# Patient Record
Sex: Female | Born: 2000 | Race: White | Hispanic: No | Marital: Single | Smoking: Current every day smoker
Health system: Southern US, Community
[De-identification: ages and names within clinical notes are randomized; demographics above are authoritative.]

## PROBLEM LIST (undated history)

## (undated) VITALS — BP 118/78 | HR 105 | Temp 98.2°F | Resp 18 | Ht 64.0 in | Wt 265.0 lb

## (undated) DIAGNOSIS — R4589 Other symptoms and signs involving emotional state: Secondary | ICD-10-CM

## (undated) DIAGNOSIS — F32A Depression, unspecified: Secondary | ICD-10-CM

## (undated) DIAGNOSIS — K219 Gastro-esophageal reflux disease without esophagitis: Secondary | ICD-10-CM

## (undated) DIAGNOSIS — F419 Anxiety disorder, unspecified: Secondary | ICD-10-CM

## (undated) DIAGNOSIS — D649 Anemia, unspecified: Secondary | ICD-10-CM

## (undated) DIAGNOSIS — R45851 Suicidal ideations: Secondary | ICD-10-CM

## (undated) DIAGNOSIS — J45909 Unspecified asthma, uncomplicated: Secondary | ICD-10-CM

## (undated) DIAGNOSIS — T7840XA Allergy, unspecified, initial encounter: Secondary | ICD-10-CM

## (undated) DIAGNOSIS — F649 Gender identity disorder, unspecified: Secondary | ICD-10-CM

## (undated) DIAGNOSIS — F429 Obsessive-compulsive disorder, unspecified: Secondary | ICD-10-CM

## (undated) DIAGNOSIS — F329 Major depressive disorder, single episode, unspecified: Secondary | ICD-10-CM

## (undated) DIAGNOSIS — F1210 Cannabis abuse, uncomplicated: Secondary | ICD-10-CM

## (undated) HISTORY — DX: Gastro-esophageal reflux disease without esophagitis: K21.9

## (undated) HISTORY — DX: Allergy, unspecified, initial encounter: T78.40XA

## (undated) HISTORY — PX: OTHER SURGICAL HISTORY: SHX169

## (undated) HISTORY — DX: Anemia, unspecified: D64.9

## (undated) HISTORY — DX: Unspecified asthma, uncomplicated: J45.909

---
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
## 2018-02-08 ENCOUNTER — Encounter: Payer: Self-pay | Admitting: Physician Assistant

## 2018-02-08 ENCOUNTER — Ambulatory Visit: Payer: Managed Care, Other (non HMO) | Admitting: Physician Assistant

## 2018-02-08 ENCOUNTER — Other Ambulatory Visit: Payer: Self-pay

## 2018-02-08 VITALS — BP 122/82 | HR 93 | Temp 98.0°F | Resp 16 | Ht 65.0 in | Wt 246.2 lb

## 2018-02-08 DIAGNOSIS — F640 Transsexualism: Secondary | ICD-10-CM

## 2018-02-08 DIAGNOSIS — Z79890 Hormone replacement therapy (postmenopausal): Secondary | ICD-10-CM | POA: Insufficient documentation

## 2018-02-08 DIAGNOSIS — IMO0001 Reserved for inherently not codable concepts without codable children: Secondary | ICD-10-CM

## 2018-02-08 DIAGNOSIS — Z789 Other specified health status: Secondary | ICD-10-CM | POA: Insufficient documentation

## 2018-02-08 MED ORDER — TESTOSTERONE CYPIONATE 200 MG/ML IM SOLN: 200.0000 mg | INTRAMUSCULAR | 2 refills | Status: DC

## 2018-02-08 NOTE — Patient Instructions (Signed)
     IF you received an x-ray today, you will receive an invoice from [facility]. Please contact [facility] at [phone] with questions or concerns regarding your invoice.   IF you received labwork today, you will receive an invoice from [facility]. Please contact [facility] at [phone] with questions or concerns regarding your invoice.   Our billing staff will not be able to assist you with questions regarding bills from these companies.  You will be contacted with the lab results as soon as they are available. The fastest way to get your results is to activate your My Chart account. Instructions are located on the last page of this paperwork. If you have not heard from us regarding the results in 2 weeks, please contact this office.     

## 2018-02-08 NOTE — Progress Notes (Signed)
Samantha Hunter  MRN: [id number] DOB: [birth year]  PCP: Patient, No Pcp Per  Subjective:  Pt is a female to female transgender patient who presents to clinic for modication refill.   Testosterone cypionate 12/27. Rx by his endocrinologist Linward Natal who recently retired. He has been having a very difficult time getting in touch with his office. Lst dose > 2 weeks ago. Denies breakthrough bleeding.  He has been taking this since 09/2017.   Just got a job at the [employer]. He is very happy about this.   Review of Systems  Constitutional: Negative for chills, diaphoresis, fatigue and fever.  HENT: Negative for congestion, postnasal drip, rhinorrhea, sinus pressure, sneezing and sore throat.   Respiratory: Negative for cough, chest tightness, shortness of breath and wheezing.   Cardiovascular: Negative for chest pain and palpitations.  Gastrointestinal: Negative for abdominal pain, diarrhea, nausea and vomiting.  Neurological: Negative for weakness, light-headedness and headaches.    Patient Active Problem List   Diagnosis Date Noted  . MDD (major depressive disorder), recurrent episode ([place]) 06/01/2017  . Gender dysphoria 05/12/2017  . Major depressive disorder, recurrent severe without psychotic features ([place]) 05/11/2017  . Cannabis use disorder, mild, abuse 03/16/2017  . OCD (obsessive compulsive disorder) 03/16/2017  . Suicidal ideation 03/16/2017  . Non-suicidal self harm 03/16/2017  . MDD (major depressive disorder), recurrent severe, without psychosis ([place]) 03/15/2017    Current Outpatient Medications on File Prior to Visit  Medication Sig Dispense Refill  . hydrOXYzine (ATARAX/VISTARIL) 50 MG tablet Take 1 tablet (50 mg total) by mouth 3 (three) times daily as needed for anxiety. 90 tablet 0  . testosterone cypionate (DEPOTESTOSTERONE CYPIONATE) 200 MG/ML injection Inject 200 mg into the muscle every 14 (fourteen) days.    . ARIPiprazole (ABILIFY) 5 MG tablet Take 1  tablet (5 mg total) by mouth daily. (Patient not taking: Reported on 02/08/2018) 30 tablet 0  . FLUoxetine (PROZAC) 20 MG capsule Take 1 capsule (20 mg total) by mouth daily. (Patient not taking: Reported on 02/08/2018) 30 capsule 0  . gabapentin (NEURONTIN) 400 MG capsule Take 1 capsule (400 mg total) by mouth at bedtime. (Patient not taking: Reported on 02/08/2018) 30 capsule 0  . loratadine (CLARITIN) 10 MG tablet Take 1 tablet (10 mg total) by mouth daily. (Patient not taking: Reported on 02/08/2018) 30 tablet 0  . nicotine polacrilex (NICORETTE) 2 MG gum Take 1 each (2 mg total) by mouth as needed for smoking cessation. (Patient not taking: Reported on 08/01/2017) 100 tablet 0  . prazosin (MINIPRESS) 2 MG capsule Take 1 capsule (2 mg total) by mouth at bedtime. (Patient not taking: Reported on 02/08/2018) 30 capsule 0   No current facility-administered medications on file prior to visit.     Allergies  Allergen Reactions  . Pineapple Hives and Itching     Objective:  BP 122/82   Pulse 93   Temp 98 F (36.7 C) (Oral)   Resp 16   Ht 5' 5"  (1.651 m)   Wt 246 lb 3.2 oz (111.7 kg)   SpO2 98%   BMI 40.97 kg/m   Physical Exam  Constitutional: He is oriented to person, place, and time and well-developed, well-nourished, and in no distress. No distress.  obese  Cardiovascular: Normal rate, regular rhythm and normal heart sounds.  Neurological: He is alert and oriented to person, place, and time. GCS score is 15.  Skin: Skin is warm and dry.  Psychiatric: Mood, memory, affect and judgment normal.  Vitals reviewed.   Assessment and Plan :  1. 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 Long-term current use of testosterone cypionate - testosterone cypionate (DEPOTESTOSTERONE CYPIONATE) 200 MG/ML injection; Inject 1 mL (200 mg total) into the muscle every 14 (fourteen) days.  Dispense: 1 mL; Refill: 2 - Pt needing testosterone medication refill as his endocrinologist recently and suddenly left the practice.  I called pharmacist and confirmed dose of medication: 211m/mL inj 0.674mIM q 2 weeks. OK to refill. Advised pt to find another endocrinologist to manage care.   WhMercer PodPA-C  [facility]/12/2017 11:43 AM

## 2018-03-20 ENCOUNTER — Ambulatory Visit (HOSPITAL_COMMUNITY)
Admission: EM | Admit: 2018-03-20 | Discharge: 2018-03-20 | Disposition: A | Discharge status: Home / Self Care | Payer: Managed Care, Other (non HMO) | Attending: Family Medicine | Admitting: Family Medicine

## 2018-03-20 ENCOUNTER — Encounter (HOSPITAL_COMMUNITY): Payer: Self-pay | Admitting: Emergency Medicine

## 2018-03-20 DIAGNOSIS — J020 Streptococcal pharyngitis: Secondary | ICD-10-CM

## 2018-03-20 LAB — POCT RAPID STREP A: Streptococcus, Group A Screen (Direct): POSITIVE — AB

## 2018-03-20 MED ORDER — AMOXICILLIN 875 MG PO TABS: 875.0000 mg | ORAL_TABLET | Freq: Two times a day (BID) | ORAL | 0 refills | Status: DC

## 2018-03-20 NOTE — ED Triage Notes (Signed)
Pt c/o severe throat pain.

## 2018-03-23 ENCOUNTER — Inpatient Hospital Stay (HOSPITAL_COMMUNITY)
Admission: EM | Admit: 2018-03-23 | Discharge: 2018-03-25 | DRG: 153 | Disposition: A | Discharge status: Home / Self Care | Payer: Managed Care, Other (non HMO) | Attending: Internal Medicine | Admitting: Internal Medicine

## 2018-03-23 ENCOUNTER — Encounter (HOSPITAL_COMMUNITY): Payer: Self-pay | Admitting: Emergency Medicine

## 2018-03-23 ENCOUNTER — Emergency Department (HOSPITAL_COMMUNITY): Payer: Managed Care, Other (non HMO)

## 2018-03-23 ENCOUNTER — Other Ambulatory Visit: Payer: Self-pay

## 2018-03-23 DIAGNOSIS — F329 Major depressive disorder, single episode, unspecified: Secondary | ICD-10-CM | POA: Diagnosis present

## 2018-03-23 DIAGNOSIS — F429 Obsessive-compulsive disorder, unspecified: Secondary | ICD-10-CM | POA: Diagnosis present

## 2018-03-23 DIAGNOSIS — Z823 Family history of stroke: Secondary | ICD-10-CM | POA: Diagnosis not present

## 2018-03-23 DIAGNOSIS — K219 Gastro-esophageal reflux disease without esophagitis: Secondary | ICD-10-CM | POA: Diagnosis present

## 2018-03-23 DIAGNOSIS — F419 Anxiety disorder, unspecified: Secondary | ICD-10-CM | POA: Diagnosis present

## 2018-03-23 DIAGNOSIS — E860 Dehydration: Secondary | ICD-10-CM | POA: Diagnosis present

## 2018-03-23 DIAGNOSIS — F32A Depression, unspecified: Secondary | ICD-10-CM | POA: Diagnosis present

## 2018-03-23 DIAGNOSIS — Z23 Encounter for immunization: Secondary | ICD-10-CM | POA: Diagnosis not present

## 2018-03-23 DIAGNOSIS — R1310 Dysphagia, unspecified: Secondary | ICD-10-CM

## 2018-03-23 DIAGNOSIS — F17210 Nicotine dependence, cigarettes, uncomplicated: Secondary | ICD-10-CM | POA: Diagnosis present

## 2018-03-23 DIAGNOSIS — J0300 Acute streptococcal tonsillitis, unspecified: Secondary | ICD-10-CM | POA: Diagnosis present

## 2018-03-23 DIAGNOSIS — Z8349 Family history of other endocrine, nutritional and metabolic diseases: Secondary | ICD-10-CM

## 2018-03-23 DIAGNOSIS — Z8249 Family history of ischemic heart disease and other diseases of the circulatory system: Secondary | ICD-10-CM | POA: Diagnosis not present

## 2018-03-23 DIAGNOSIS — K148 Other diseases of tongue: Secondary | ICD-10-CM | POA: Insufficient documentation

## 2018-03-23 DIAGNOSIS — F649 Gender identity disorder, unspecified: Secondary | ICD-10-CM | POA: Diagnosis present

## 2018-03-23 DIAGNOSIS — J0301 Acute recurrent streptococcal tonsillitis: Secondary | ICD-10-CM | POA: Diagnosis not present

## 2018-03-23 DIAGNOSIS — J45909 Unspecified asthma, uncomplicated: Secondary | ICD-10-CM | POA: Diagnosis present

## 2018-03-23 DIAGNOSIS — J020 Streptococcal pharyngitis: Secondary | ICD-10-CM

## 2018-03-23 DIAGNOSIS — R130 Aphagia: Secondary | ICD-10-CM

## 2018-03-23 DIAGNOSIS — R509 Fever, unspecified: Secondary | ICD-10-CM

## 2018-03-23 DIAGNOSIS — J988 Other specified respiratory disorders: Secondary | ICD-10-CM | POA: Insufficient documentation

## 2018-03-23 DIAGNOSIS — R1319 Other dysphagia: Secondary | ICD-10-CM | POA: Diagnosis present

## 2018-03-23 LAB — CBC WITH DIFFERENTIAL/PLATELET
BASOS PCT: 1 %
Basophils Absolute: 0.1 10*3/uL (ref 0.0–0.1)
Eosinophils Absolute: 0 10*3/uL (ref 0.0–0.7)
Eosinophils Relative: 0 %
HCT: 38.5 % (ref 36.0–46.0)
Hemoglobin: 12.3 g/dL (ref 12.0–15.0)
LYMPHS PCT: 56 %
Lymphs Abs: 7 10*3/uL — ABNORMAL HIGH (ref 0.7–4.0)
MCH: 24.5 pg — AB (ref 26.0–34.0)
MCHC: 31.9 g/dL (ref 30.0–36.0)
MCV: 76.5 fL — AB (ref 78.0–100.0)
MONOS PCT: 10 %
Monocytes Absolute: 1.3 10*3/uL — ABNORMAL HIGH (ref 0.1–1.0)
NEUTROS ABS: 4.2 10*3/uL (ref 1.7–7.7)
Neutrophils Relative %: 33 %
Platelets: 257 10*3/uL (ref 150–400)
RBC: 5.03 MIL/uL (ref 3.87–5.11)
RDW: 17.6 % — ABNORMAL HIGH (ref 11.5–15.5)
WBC: 12.6 10*3/uL — ABNORMAL HIGH (ref 4.0–10.5)

## 2018-03-23 LAB — COMPREHENSIVE METABOLIC PANEL
ALBUMIN: 3.6 g/dL (ref 3.5–5.0)
ALK PHOS: 62 U/L (ref 38–126)
ALT: 26 U/L (ref 14–54)
ANION GAP: 12 (ref 5–15)
AST: 27 U/L (ref 15–41)
BUN: 13 mg/dL (ref 6–20)
CALCIUM: 9 mg/dL (ref 8.9–10.3)
CO2: 21 mmol/L — AB (ref 22–32)
Chloride: 103 mmol/L (ref 101–111)
Creatinine, Ser: 1.01 mg/dL — ABNORMAL HIGH (ref 0.44–1.00)
GFR calc Af Amer: >60 mL/min (ref >60)
GFR calc non Af Amer: >60 mL/min (ref >60)
GLUCOSE: 93 mg/dL (ref 65–99)
Potassium: 4.1 mmol/L (ref 3.5–5.1)
Sodium: 136 mmol/L (ref 135–145)
Total Bilirubin: 0.6 mg/dL (ref 0.3–1.2)
Total Protein: 7.9 g/dL (ref 6.5–8.1)

## 2018-03-23 LAB — HIV ANTIBODY (ROUTINE TESTING W REFLEX): HIV Screen 4th Generation wRfx: NONREACTIVE

## 2018-03-23 LAB — I-STAT CG4 LACTIC ACID, ED: Lactic Acid, Venous: 0.96 mmol/L (ref 0.5–1.9)

## 2018-03-23 MED ORDER — DEXAMETHASONE SODIUM PHOSPHATE 4 MG/ML IJ SOLN
4.0000 mg | Freq: Four times a day (QID) | INTRAMUSCULAR | Status: DC
  Administered 2018-03-23 – 2018-03-25 (×9): 4 mg via INTRAVENOUS
  Filled 2018-03-23 (×10): qty 1

## 2018-03-23 MED ORDER — IOPAMIDOL (ISOVUE-300) INJECTION 61%
100.0000 mL | Freq: Once | INTRAVENOUS | Status: AC | PRN
  Administered 2018-03-23: 100 mL via INTRAVENOUS

## 2018-03-23 MED ORDER — PNEUMOCOCCAL VAC POLYVALENT 25 MCG/0.5ML IJ INJ
0.5000 mL | INJECTION | INTRAMUSCULAR | Status: AC
  Administered 2018-03-25: 0.5 mL via INTRAMUSCULAR
  Filled 2018-03-23: qty 0.5

## 2018-03-23 MED ORDER — ONDANSETRON HCL 4 MG PO TABS: 4.0000 mg | ORAL_TABLET | Freq: Four times a day (QID) | ORAL | Status: DC | PRN

## 2018-03-23 MED ORDER — ONDANSETRON HCL 4 MG/2ML IJ SOLN
4.0000 mg | Freq: Four times a day (QID) | INTRAMUSCULAR | Status: DC | PRN
  Administered 2018-03-24: 4 mg via INTRAVENOUS
  Filled 2018-03-23: qty 2

## 2018-03-23 MED ORDER — SODIUM CHLORIDE 0.9 % IV BOLUS
1000.0000 mL | Freq: Once | INTRAVENOUS | Status: AC
  Administered 2018-03-23: 1000 mL via INTRAVENOUS

## 2018-03-23 MED ORDER — ENOXAPARIN SODIUM 40 MG/0.4ML ~~LOC~~ SOLN
40.0000 mg | Freq: Every day | SUBCUTANEOUS | Status: DC
  Administered 2018-03-23 – 2018-03-25 (×2): 40 mg via SUBCUTANEOUS
  Filled 2018-03-23 (×3): qty 0.4

## 2018-03-23 MED ORDER — FENTANYL CITRATE (PF) 100 MCG/2ML IJ SOLN
50.0000 ug | Freq: Once | INTRAMUSCULAR | Status: AC
  Administered 2018-03-23: 50 ug via INTRAVENOUS
  Filled 2018-03-23: qty 2

## 2018-03-23 MED ORDER — DIPHENHYDRAMINE HCL 50 MG/ML IJ SOLN
25.0000 mg | Freq: Once | INTRAMUSCULAR | Status: AC
  Administered 2018-03-23: 25 mg via INTRAVENOUS

## 2018-03-23 MED ORDER — CLINDAMYCIN PHOSPHATE 600 MG/50ML IV SOLN: 600.0000 mg | Freq: Three times a day (TID) | INTRAVENOUS | Status: DC

## 2018-03-23 MED ORDER — LIDOCAINE-EPINEPHRINE (PF) 2 %-1:200000 IJ SOLN
0.0000 mL | Freq: Once | INTRAMUSCULAR | Status: DC | PRN
  Filled 2018-03-23: qty 30

## 2018-03-23 MED ORDER — CLINDAMYCIN PHOSPHATE 600 MG/50ML IV SOLN
600.0000 mg | Freq: Once | INTRAVENOUS | Status: AC
  Administered 2018-03-23: 600 mg via INTRAVENOUS
  Filled 2018-03-23: qty 50

## 2018-03-23 MED ORDER — SODIUM CHLORIDE 0.9 % IV SOLN
3.0000 g | Freq: Four times a day (QID) | INTRAVENOUS | Status: DC
  Administered 2018-03-23 – 2018-03-25 (×9): 3 g via INTRAVENOUS
  Filled 2018-03-23 (×10): qty 3

## 2018-03-23 MED ORDER — SODIUM CHLORIDE 0.9% FLUSH
3.0000 mL | Freq: Two times a day (BID) | INTRAVENOUS | Status: DC
Start: 2018-03-23 — End: 2018-03-25
  Administered 2018-03-24: 3 mL via INTRAVENOUS

## 2018-03-23 MED ORDER — MORPHINE SULFATE (PF) 4 MG/ML IV SOLN
1.0000 mg | INTRAVENOUS | Status: DC | PRN
  Administered 2018-03-24: 4 mg via INTRAVENOUS
  Filled 2018-03-23 (×2): qty 1

## 2018-03-23 MED ORDER — PANTOPRAZOLE SODIUM 40 MG IV SOLR
40.0000 mg | INTRAVENOUS | Status: DC
  Administered 2018-03-23 – 2018-03-24 (×2): 40 mg via INTRAVENOUS
  Filled 2018-03-23 (×2): qty 40

## 2018-03-23 MED ORDER — GI COCKTAIL ~~LOC~~
30.0000 mL | Freq: Once | ORAL | Status: AC
  Administered 2018-03-23: 30 mL via ORAL
  Filled 2018-03-23: qty 30

## 2018-03-23 MED ORDER — IOPAMIDOL (ISOVUE-300) INJECTION 61%
INTRAVENOUS | Status: AC
  Filled 2018-03-23: qty 100

## 2018-03-23 MED ORDER — KETOROLAC TROMETHAMINE 15 MG/ML IJ SOLN
15.0000 mg | Freq: Four times a day (QID) | INTRAMUSCULAR | Status: DC
  Administered 2018-03-23 – 2018-03-25 (×10): 15 mg via INTRAVENOUS
  Filled 2018-03-23 (×10): qty 1

## 2018-03-23 MED ORDER — FAMOTIDINE IN NACL 20-0.9 MG/50ML-% IV SOLN
20.0000 mg | Freq: Two times a day (BID) | INTRAVENOUS | Status: DC
  Administered 2018-03-23 – 2018-03-24 (×4): 20 mg via INTRAVENOUS
  Filled 2018-03-23 (×5): qty 50

## 2018-03-23 MED ORDER — DEXAMETHASONE SODIUM PHOSPHATE 10 MG/ML IJ SOLN
10.0000 mg | Freq: Once | INTRAMUSCULAR | Status: AC
  Administered 2018-03-23: 10 mg via INTRAVENOUS
  Filled 2018-03-23: qty 1

## 2018-03-23 MED ORDER — ACETAMINOPHEN 500 MG PO TABS
1000.0000 mg | ORAL_TABLET | Freq: Once | ORAL | Status: DC
Start: 2018-03-23 — End: 2018-03-23

## 2018-03-23 MED ORDER — ACETAMINOPHEN 325 MG PO TABS: 650.0000 mg | ORAL_TABLET | Freq: Four times a day (QID) | ORAL | Status: DC | PRN

## 2018-03-23 MED ORDER — LIDOCAINE HCL (PF) 2 % IJ SOLN
0.0000 mL | Freq: Once | INTRAMUSCULAR | Status: DC | PRN
  Filled 2018-03-23: qty 20

## 2018-03-23 MED ORDER — DIPHENHYDRAMINE HCL 50 MG/ML IJ SOLN
INTRAMUSCULAR | Status: AC
  Filled 2018-03-23: qty 1

## 2018-03-23 MED ORDER — SODIUM CHLORIDE 0.9 % IV SOLN
INTRAVENOUS | Status: DC
  Administered 2018-03-23 – 2018-03-24 (×3): via INTRAVENOUS
  Administered 2018-03-24: 1000 mL via INTRAVENOUS

## 2018-03-23 MED ORDER — ACETAMINOPHEN 650 MG RE SUPP: 650.0000 mg | Freq: Four times a day (QID) | RECTAL | Status: DC | PRN

## 2018-03-23 MED ORDER — ACETAMINOPHEN 160 MG/5ML PO SOLN
650.0000 mg | Freq: Once | ORAL | Status: AC
  Administered 2018-03-23: 650 mg via ORAL
  Filled 2018-03-23: qty 20.3

## 2018-03-23 NOTE — ED Notes (Signed)
Clear liquid diet lunch tray ordered 

## 2018-03-23 NOTE — ED Notes (Addendum)
Spoke with [place], PA and advised to order infection order set.  Pt states unable to swallow liquids.

## 2018-03-23 NOTE — ED Provider Notes (Addendum)
[facility] EMERGENCY DEPARTMENT Provider Note   CSN: [id number] Arrival date & time: 03/23/18  0035     History   Chief Complaint Chief Complaint  Patient presents with  . strep throat  . Fever    HPI Samantha Hunter is a 17 y.o. 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  Pt reports he was seen yesterday at urgent care, diagnosed with strep pharyngitis and prescribed Amoxicillin but was unable to swallow his pills.  He reports that since yesterday evening he has been unable to swallow anything including fluids and his own saliva.  He reports significant swelling to the anterior neck and inferior mandible.  Pt reports unable to fully move his neck due to anterior pain.   Pt reports he is taking testosterone injections for his transition but no other medications.  Any attempt to eat, drink or swallow make his symptoms significantly worse.  Nothing seems to make them better.  He does report associated voice change, fevers, chills and sweats at home.  He denies chest pain, shortness of breath, difficulty breathing, abdominal pain, vomiting, diarrhea, weakness, dizziness, syncope.  He denies known sick contacts.  The history is provided by the patient and medical records. No language interpreter was used.    Past Medical History:  Diagnosis Date  . Allergy   . Anemia   . Anxiety   . Asthma   . Cannabis use disorder, mild, abuse 03/16/2017  . Depression   . Gender dysphoria 05/12/2017  . GERD (gastroesophageal reflux disease)   . Non-suicidal self harm 03/16/2017  . OCD (obsessive compulsive disorder) 03/16/2017  . Suicidal ideation 03/16/2017    Patient Active Problem List   Diagnosis Date Noted  . 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 Long-term current use of testosterone cypionate  02/08/2018  . MDD (major depressive disorder), recurrent episode (HCC) 06/01/2017  . Gender dysphoria 05/12/2017  . 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 Cannabis use disorder, mild, abuse 03/16/2017  . OCD (obsessive compulsive disorder) 03/16/2017  . Suicidal ideation 03/16/2017  . Non-suicidal self harm 03/16/2017  . MDD (major depressive disorder), recurrent severe, without psychosis (HCC) 03/15/2017    Past Surgical History:  Procedure Laterality Date  . wisdom tooth removal Bilateral      OB History   None      Home Medications    Prior to Admission medications   Medication Sig Start Date End Date Taking? Authorizing Provider  amoxicillin (AMOXIL) 875 MG tablet Take 1 tablet (875 mg total) by mouth 2 (two) times daily for 10 days. 03/20/18 03/30/18 Yes Hagler, Arlys John, MD  hydrOXYzine (ATARAX/VISTARIL) 50 MG tablet Take 1 tablet (50 mg total) by mouth 3 (three) times daily as needed for anxiety. 08/29/17  Yes Arfeen, Phillips Grout, MD  Sodium Fluoride (PREVIDENT 5000 BOOSTER) 1.1 % PSTE Place 1 application onto teeth 2 (two) times daily.   Yes [provider]  testosterone cypionate (DEPOTESTOSTERONE CYPIONATE) 200 MG/ML injection Inject 1 mL (200 mg total) into the muscle every 14 (fourteen) days. 02/08/18  Yes McVey, Madelaine Bhat, PA-C    Family History Family History  Problem Relation Age of Onset  . Mental illness Mother   . Alcohol abuse Mother   . Drug abuse Mother   . Depression Mother   . Hyperlipidemia  Mother   . Mental illness Father   . Alcohol abuse Father   . Drug abuse Father   . Depression Father   . Cancer Father        skin  . Heart disease Father   . Hyperlipidemia Father   . Stroke Father   . Mental illness Brother   . Anxiety disorder Brother   . Depression Brother   . Anxiety disorder Sister   . Depression Sister   . Mental illness Sister   . Alcohol abuse Maternal Uncle   . Drug  abuse Maternal Uncle   . Alcohol abuse Maternal Grandfather   . Mental illness Brother   . Mental illness Sister     Social History Social History   Tobacco Use  . Smoking status: Current Every Day Smoker    Packs/day: 0.25    Years: 1.00    Pack years: 0.25    Types: Cigarettes  . Smokeless tobacco: Former Neurosurgeon    Types: Chew  Substance Use Topics  . Alcohol use: No  . Drug use: Yes    Frequency: 14.0 times per week    Types: Marijuana     Allergies   Pineapple   Review of Systems Review of Systems  Constitutional: Positive for chills, diaphoresis, fatigue and fever. Negative for appetite change and unexpected weight change.  HENT: Positive for drooling, sore throat and trouble swallowing. Negative for mouth sores.   Eyes: Negative for visual disturbance.  Respiratory: Negative for cough, chest tightness, shortness of breath and wheezing.   Cardiovascular: Negative for chest pain.  Gastrointestinal: Negative for abdominal pain, constipation, diarrhea, nausea and vomiting.  Endocrine: Negative for polydipsia, polyphagia and polyuria.  Genitourinary: Negative for dysuria, frequency, hematuria and urgency.  Musculoskeletal: Negative for back pain and neck stiffness.  Skin: Negative for rash.  Allergic/Immunologic: Negative for immunocompromised state.  Neurological: Negative for syncope, light-headedness and headaches.  Hematological: Does not bruise/bleed easily.  Psychiatric/Behavioral: Negative for sleep disturbance. The patient is not nervous/anxious.      Physical Exam Updated Vital Signs BP 132/90 (BP Location: Right Arm)   Pulse (!) 138   Temp (!) 101.3 F (38.5 C) (Oral)   Resp (!) 22   SpO2 100%   Physical Exam  Constitutional: He appears well-developed and well-nourished. No distress.  HENT:  Head: Normocephalic and atraumatic.  Right Ear: Tympanic membrane, external ear and ear canal normal.  Left Ear: Tympanic membrane, external ear and ear  canal normal.  Nose: Nose normal. No mucosal edema or rhinorrhea.  Mouth/Throat: Uvula is midline and mucous membranes are normal. Mucous membranes are not dry. No trismus in the jaw. No uvula swelling. Oropharyngeal exudate, posterior oropharyngeal edema and posterior oropharyngeal erythema present. No tonsillar abscesses.  Posterior oropharynx with significant erythema, edema and exudate on the tonsils Significant swelling and firmness of the soft tissue of the mandible No woody induration to the floor of the mouth  Eyes: Conjunctivae are normal.  Neck: Normal range of motion, full passive range of motion without pain and phonation normal. No tracheal tenderness, no spinous process tenderness and no muscular tenderness present. No neck rigidity. No erythema and normal range of motion present. No Brudzinski's sign and no Kernig's sign noted.  Most complete range of motion No midline or paraspinal tenderness Hot potato voice No stridor Patient spitting into a bag No nuchal rigidity or meningeal signs  Cardiovascular: Regular rhythm and normal heart sounds. Tachycardia present.  Pulses:  Radial pulses are 2+ on the right side, and 2+ on the left side.  Pulmonary/Chest: Effort normal and breath sounds normal. No stridor. No respiratory distress. He has no decreased breath sounds. He has no wheezes.  Equal chest expansion, clear and equal breath sounds without focal wheezes, rhonchi or rales  Musculoskeletal: Normal range of motion.  Lymphadenopathy:       Head (right side): Submandibular and tonsillar adenopathy present. No submental, no preauricular, no posterior auricular and no occipital adenopathy present.       Head (left side): Submandibular and tonsillar adenopathy present. No submental, no preauricular, no posterior auricular and no occipital adenopathy present.    He has cervical adenopathy.       Right cervical: No superficial cervical, no deep cervical and no posterior cervical  adenopathy present.      Left cervical: No superficial cervical, no deep cervical and no posterior cervical adenopathy present.  Neurological: He is alert.  Alert and oriented Moves all extremities without ataxia  Skin: Skin is warm and dry. He is not diaphoretic.  Psychiatric: He has a normal mood and affect.  Nursing note and vitals reviewed.    ED Treatments / Results  Labs (all labs ordered are listed, but only abnormal results are displayed) Labs Reviewed  COMPREHENSIVE METABOLIC PANEL - Abnormal; Notable for the following components:      Result Value   CO2 21 (*)    Creatinine, Ser 1.01 (*)    All other components within normal limits  CBC WITH DIFFERENTIAL/PLATELET - Abnormal; Notable for the following components:   WBC 12.6 (*)    MCV 76.5 (*)    MCH 24.5 (*)    RDW 17.6 (*)    Lymphs Abs 7.0 (*)    Monocytes Absolute 1.3 (*)    All other components within normal limits  I-STAT CG4 LACTIC ACID, ED     Radiology Ct Soft Tissue Neck W Contrast  Result Date: 03/23/2018 CLINICAL DATA:  Sore throat for 3 days, diagnosed with strep throat 3 days ago. Unable to take antibiotics or swallow since yesterday. EXAM: CT NECK WITH CONTRAST TECHNIQUE: Multidetector CT imaging of the neck was performed using the standard protocol following the bolus administration of intravenous contrast. CONTRAST:  100mL ISOVUE-300 IOPAMIDOL (ISOVUE-300) INJECTION 61% COMPARISON:  None. FINDINGS: PHARYNX AND LARYNX: Symmetrically enlarged bilateral palatine tonsils with striated enhancement focally narrowing the airway. No fluid collection. Preservation of parapharyngeal fat planes. Normal larynx. SALIVARY GLANDS: Normal. THYROID: Normal. LYMPH NODES: Homogeneously enhancing upper cervical lymphadenopathy measuring to 2.2 cm LEFT level IIa. VASCULAR: Normal. LIMITED INTRACRANIAL: Normal. VISUALIZED ORBITS: Normal. MASTOIDS AND VISUALIZED PARANASAL SINUSES: Well-aerated. SKELETON: Nonacute. UPPER CHEST:  Lung apices are clear. 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 No superior mediastinal lymphadenopathy. OTHER: None. IMPRESSION: 1. Acute tonsillitis without peritonsillar abscess. Focally narrowed airway. 2. Reactive lymphadenopathy. Electronically Signed   By: Awilda Metroourtnay  Bloomer M.D.   On: 03/23/2018 03:31    Procedures Procedures (including critical care time)  Medications Ordered in ED Medications  iopamidol (ISOVUE-300) 61 % injection (has no administration in time range)  diphenhydrAMINE (BENADRYL) 50 MG/ML injection (has no administration in time range)  sodium chloride 0.9 % bolus 1,000 mL (0 mLs Intravenous Stopped 03/23/18 0259)  acetaminophen (TYLENOL) solution 650 mg (650 mg Oral Given 03/23/18 0145)  fentaNYL (SUBLIMAZE) injection 50 mcg (50 mcg Intravenous Given 03/23/18 0149)  sodium chloride 0.9 % bolus 1,000 mL (0 mLs  Intravenous Stopped 03/23/18 0436)  iopamidol (ISOVUE-300) 61 % injection 100 mL (100 mLs Intravenous Contrast Given 03/23/18 0301)  clindamycin (CLEOCIN) IVPB 600 mg (0 mg Intravenous Stopped 03/23/18 0506)  fentaNYL (SUBLIMAZE) injection 50 mcg (50 mcg Intravenous Given 03/23/18 0500)  dexamethasone (DECADRON) injection 10 mg (10 mg Intravenous Given 03/23/18 0655)  gi cocktail (Maalox,Lidocaine,Donnatal) (30 mLs Oral Given 03/23/18 1610)     Initial Impression / Assessment and Plan / ED Course  I have reviewed the triage vital signs and the nursing notes.  Pertinent labs & imaging results that were available during my care of the patient were reviewed by me and considered in my medical decision making (see chart for details).  Clinical Course as of Mar 23 705  Sat Mar 23, 2018  0140 Leukocytosis noted  WBC(!): 12.6 [HM]  0141 Tachycardic  Pulse Rate(!): 138 [HM]  0141 febrile  Temp(!): 101.3 F (38.5 C) [HM]  0141 Normal  Lactic Acid, Venous: 0.96 [HM]  0555 Discussed with Dr. Melynda Ripple who will admit     [HM]  0706 Pt is unable to swallow the GI cocktail   [HM]    Clinical Course User Index [HM] Brittani Purdum, Dahlia Client, PA-C    Patient presents with history of strep pharyngitis but unable to tolerate secretions.  He is febrile and tachycardic.  Leukocytosis noted.  Patient does meet Sirs criteria however I doubt that he is septic.  Rather with a normal lactic acid I suspect his abnormal vital signs are secondary to his fever and dehydration.  Will give fluids and reassess.  Patient with significantly improved heart rate after Tylenol administration and fluids.  His fever decreased.  CT scan without evidence of peritonsillar abscess, Ludwig's angina or oropharyngeal abscess.  I personally evaluated the images.  Patient given multiple doses of pain medication however he remains unable to swallow due to significant edema of his posterior oropharynx.  He will need admission for IV antibiotics, pain control and fluid management.  Pt given decadron and Gi cocktail as well.     Final Clinical Impressions(s) / ED Diagnoses   Final diagnoses:  Strep pharyngitis  Febrile illness  Inability to swallow    ED Discharge Orders    None       Tavonna Worthington, Boyd Kerbs 03/23/18 0558    Melene Plan, DO 03/23/18 0625    Otoniel Myhand, Dahlia Client, PA-C 03/23/18 0706    Melene Plan, DO 03/23/18 2306

## 2018-03-23 NOTE — ED Notes (Signed)
Chicken Broth-Clear Liquid Psychologist, forensicDinner Tray Ordered @ 1841-per RN-called by Marylene LandAngela

## 2018-03-23 NOTE — ED Triage Notes (Signed)
C/o sore throat x 2-3 days.  Diagnosed with strep on Thursday.  Unable to take antibiotic pills and unable to swallow liquids since yesterday.

## 2018-03-23 NOTE — ED Notes (Signed)
EMS-C20 altered

## 2018-03-23 NOTE — ED Notes (Signed)
Meal Tray delivered and at bedside.  

## 2018-03-23 NOTE — ED Notes (Addendum)
Attempted report 

## 2018-03-23 NOTE — Plan of Care (Signed)
17 year old female past medical history significant for cannabis use, depression, gender change, reflux, OCD, suicidal ideation, anxiety and even asthma presents to the emergency room with a chief complaint of sore throat.  Patient was recently diagnosed with strep throat in urgent care.  In the emergency room he was seen by the EDP and throat was noted on exam to be very edematous.  CT scan of the neck did not show any retropharyngeal abscess. Per report tonsils were enlarged and infected looking.  Patient was given doses of narcotic pain medicine without relief.  We discussed given the patient steroids and GI cocktail or lidocaine for numbing.  These were ordered.  Patient is going to get clindamycin for strep throat.  Could not take antibiotics after going to urgent care previously due to sore throat.  Haydee SalterPhillip M Hobbs, MD

## 2018-03-23 NOTE — ED Notes (Signed)
MD Paged for different order, pt received IV Decadron via night shift and had a red rash and itching to body afterwards. Pt states he is allergic to this medication.

## 2018-03-23 NOTE — ED Notes (Signed)
Repaged [facility] 

## 2018-03-23 NOTE — H&P (Addendum)
History and Physical    "Samantha" Evers) Hunter ZOX:[id number] DOB: 2001-09-30 DOA: 03/23/2018  **Will admit patient based on the expectation that the patient will need hospitalization/ hospital care that crosses at least 2 midnights  PCP: Patient, No Pcp Per   Attending physician: Luberta Robertson  Patient coming from/Resides with: Private residence  Chief Complaint: Inability to swallow, painful swallowing, inability to manage oral secretions  HPI: "Samantha" Mortell) Hunter is a 17 y.o. adult female who identifies as being female with medical history significant for childhood asthma and ongoing tobacco abuse, GERD, anxiety and depression.  Patient was diagnosed 3 days ago with strep throat.  Was started on oral antibiotics/amoxicillin but was unable to swallow any oral medications and has been unable to eat.  Has had difficulty managing oral secretions but no audible stridor.  Has had fevers at home.  Presented to the ER because of inability to swallow and significant bilateral neck pain.  CT of neck revealed acute tonsillitis without peritonsillar abscess with a focally narrowed airway.  ED Course:  Vital Signs: BP 118/67   Pulse 95   Temp 99.2 F (37.3 C) (Oral)   Resp 16   SpO2 100%  CT soft tissue neck: As above Lab data: Sodium 136, potassium 4.1, chloride 103, CO2 21, BUN 13, creatinine 1.01, glucose 93, LFTs normal, lactic acid 0.96, WBCs 12,600 neutrophils 33% and absolute neutrophils 4.2%, lymphocytes 7 and monocytes 1.3 Medications and treatments: Saline bolus times 2 L, Tylenol 650 mg x1, fentanyl 50 mcg IV x2, clindamycin 600 mg IV x1, Decadron 10 mg IV x1, GI cocktail 30 cc x1 but unable to swallow, Benadryl 25 mg IV x1   Review of Systems:  In addition to the HPI above,  No Headache, changes with Vision or hearing, new weakness, tingling, numbness in any extremity, dizziness, dysarthria or word finding difficulty, gait disturbance or imbalance, tremors or seizure activity No  indigestion/reflux, coughing with swallowing, abdominal pain with or after eating No Chest pain, Cough or Shortness of Breath, palpitations, orthopnea or DOE No Abdominal pain, N/V, melena,hematochezia, dark tarry stools, constipation No dysuria, malodorous urine, hematuria or flank pain No new skin rashes, lesions, masses or bruises, No new joint pains, aches, swelling or redness No recent unintentional weight gain or loss No polyuria, polydypsia or polyphagia   Past Medical History:  Diagnosis Date  . Allergy   . Anemia   . Anxiety   . Asthma   . Cannabis use disorder, mild, abuse 03/16/2017  . Depression   . Gender dysphoria 05/12/2017  . GERD (gastroesophageal reflux disease)   . Non-suicidal self harm 03/16/2017  . OCD (obsessive compulsive disorder) 03/16/2017  . Suicidal ideation 03/16/2017    Past Surgical History:  Procedure Laterality Date  . wisdom tooth removal Bilateral     Social History   Socioeconomic History  . Marital status: Single    Spouse name: Not on file  . Number of children: Not on file  . Years of education: Not on file  . Highest education level: Not on file  Occupational History  . Not on file  Social Needs  . Financial resource strain: Not on file  . Food insecurity:    Worry: Not on file    Inability: Not on file  . Transportation needs:    Medical: Not on file    Non-medical: Not on file  Tobacco Use  . Smoking status: Current Every Day Smoker    Packs/day: 0.25    Years:  1.00    Pack years: 0.25    Types: Cigarettes  . Smokeless tobacco: Former Neurosurgeon    Types: Chew  Substance and Sexual Activity  . Alcohol use: No  . Drug use: Yes    Frequency: 14.0 times per week    Types: Marijuana  . Sexual activity: Never  Lifestyle  . Physical activity:    Days per week: Not on file    Minutes per session: Not on file  . Stress: Not on file  Relationships  . Social connections:    Talks on phone: Not on file    Gets together: Not on file     Attends religious service: Not on file    Active member of club or organization: Not on file    Attends meetings of clubs or organizations: Not on file    Relationship status: Not on file  . Intimate partner violence:    Fear of current or ex partner: Not on file    Emotionally abused: Not on file    Physically abused: Not on file    Forced sexual activity: Not on file  Other Topics Concern  . Not on file  Social History Narrative  . Not on file    Mobility: Independent Work history: Works at [employer]   Allergies  Allergen Reactions  . Pineapple Hives and Itching    Family History  Problem Relation Age of Onset  . Mental illness Mother   . Alcohol abuse Mother   . Drug abuse Mother   . Depression Mother   . Hyperlipidemia Mother   . Mental illness Father   . Alcohol abuse Father   . Drug abuse Father   . Depression Father   . Cancer Father        skin  . Heart disease Father   . Hyperlipidemia Father   . Stroke Father   . Mental illness Brother   . Anxiety disorder Brother   . Depression Brother   . Anxiety disorder Sister   . Depression Sister   . Mental illness Sister   . Alcohol abuse Maternal Uncle   . Drug abuse Maternal Uncle   . Alcohol abuse Maternal Grandfather   . Mental illness Brother   . Mental illness Sister      Prior to Admission medications   Medication Sig Start Date End Date Taking? Authorizing Provider  amoxicillin (AMOXIL) 875 MG tablet Take 1 tablet (875 mg total) by mouth 2 (two) times daily for 10 days. 03/20/18 03/30/18 Yes Hagler, Arlys John, MD  hydrOXYzine (ATARAX/VISTARIL) 50 MG tablet Take 1 tablet (50 mg total) by mouth 3 (three) times daily as needed for anxiety. 08/29/17  Yes Arfeen, Phillips Grout, MD  Sodium Fluoride (PREVIDENT 5000 BOOSTER) 1.1 % PSTE Place 1 application onto teeth 2 (two) times daily.   Yes [provider]  testosterone cypionate (DEPOTESTOSTERONE CYPIONATE) 200 MG/ML injection Inject 1 mL (200 mg  total) into the muscle every 14 (fourteen) days. 02/08/18  Yes Sebastian Ache, PA-C    Physical Exam: Vitals:   03/23/18 0730 03/23/18 0745 03/23/18 0800 03/23/18 0815  BP: 115/67 114/66 113/68 112/71  Pulse: 92 (!) 101 93 97  Resp: 19 (!) 23 (!) 23 (!) 22  Temp:      TempSrc:      SpO2: 98% 98% 98% 98%      Constitutional: NAD, calm, uncomfortable 2/2 ongoing throat pain Eyes: PERRL, lids and conjunctivae normal ENMT: Mucous membranes are dry  t. Posterior pharynx for "kissing tonsils" with scattered white exudate but no obvious lesions or fluid collections.Normal dentition.  Hoarse vocal phonation.  Currently not drooling but has subjective reports of inability to manage oral secretions Neck: supple with exquisitely tender to minimal palpation, posterior cervical adenopathy and anterior fullness, no thyromegaly.  Subtle stridor noted when auscultating directly over neck but no audible stridor. Respiratory: clear to auscultation bilaterally, no wheezing, no crackles. Normal respiratory effort. No accessory muscle use.  Cardiovascular: Regular rate and rhythm, no murmurs / rubs / gallops. No extremity edema. 2+ pedal pulses. No carotid bruits.  Abdomen: no tenderness, no masses palpated. No hepatosplenomegaly. Bowel sounds positive.  Musculoskeletal: no clubbing / cyanosis. No joint deformity upper and lower extremities. Good ROM, no contractures. Normal muscle tone.  Skin: no rashes, lesions, ulcers. No induration Neurologic: CN 2-12 grossly intact. Sensation intact, DTR normal. Strength 5/5 x all 4 extremities.  Psychiatric: Normal judgment and insight. Alert and oriented x 3. Normal mood.    Labs on Admission: I have personally reviewed following labs and imaging studies  CBC: Recent Labs  Lab 03/23/18 0054  WBC 12.6*  NEUTROABS 4.2  HGB 12.3  HCT 38.5  MCV 76.5*  PLT 257   Basic Metabolic Panel: Recent Labs  Lab 03/23/18 0054  NA 136  K 4.1  CL 103  CO2 21*   GLUCOSE 93  BUN 13  CREATININE 1.01*  CALCIUM 9.0   GFR: CrCl cannot be calculated (Unknown ideal weight.). Liver Function Tests: Recent Labs  Lab 03/23/18 0054  AST 27  ALT 26  ALKPHOS 62  BILITOT 0.6  PROT 7.9  ALBUMIN 3.6   No results for input(s): LIPASE, AMYLASE in the last 168 hours. No results for input(s): AMMONIA in the last 168 hours. Coagulation Profile: No results for input(s): INR, PROTIME in the last 168 hours. Cardiac Enzymes: No results for input(s): CKTOTAL, CKMB, CKMBINDEX, TROPONINI in the last 168 hours. BNP (last 3 results) No results for input(s): PROBNP in the last 8760 hours. HbA1C: No results for input(s): HGBA1C in the last 72 hours. CBG: No results for input(s): GLUCAP in the last 168 hours. Lipid Profile: No results for input(s): CHOL, HDL, LDLCALC, TRIG, CHOLHDL, LDLDIRECT in the last 72 hours. Thyroid Function Tests: No results for input(s): TSH, T4TOTAL, FREET4, T3FREE, THYROIDAB in the last 72 hours. Anemia Panel: No results for input(s): VITAMINB12, FOLATE, FERRITIN, TIBC, IRON, RETICCTPCT in the last 72 hours. Urine analysis:    Component Value Date/Time   COLORURINE YELLOW 06/01/2017 1811   APPEARANCEUR HAZY (A) 06/01/2017 1811   LABSPEC 1.016 06/01/2017 1811   PHURINE 6.0 06/01/2017 1811   GLUCOSEU NEGATIVE 06/01/2017 1811   HGBUR NEGATIVE 06/01/2017 1811   BILIRUBINUR NEGATIVE 06/01/2017 1811   KETONESUR NEGATIVE 06/01/2017 1811   PROTEINUR NEGATIVE 06/01/2017 1811   NITRITE NEGATIVE 06/01/2017 1811   LEUKOCYTESUR TRACE (A) 06/01/2017 1811   Sepsis Labs: @LABRCNTIP (procalcitonin:4,lacticidven:4) )No results found for this or any previous visit (from the past 240 hour(s)).   Radiological Exams on Admission: Ct Soft Tissue Neck W Contrast  Result Date: 03/23/2018 CLINICAL DATA:  Sore throat for 3 days, diagnosed with strep throat 3 days ago. Unable to take antibiotics or swallow since yesterday. EXAM: CT NECK WITH CONTRAST  TECHNIQUE: Multidetector CT imaging of the neck was performed using the standard protocol following the bolus administration of intravenous contrast. CONTRAST:  ISOVUE-300 IOPAMIDOL (ISOVUE-300) INJECTION 61% COMPARISON:  None. FINDINGS: PHARYNX AND LARYNX: Symmetrically enlarged bilateral  palatine tonsils with striated enhancement focally narrowing the airway. No fluid collection. Preservation of parapharyngeal fat planes. Normal larynx. SALIVARY GLANDS: Normal. THYROID: Normal. LYMPH NODES: Homogeneously enhancing upper cervical lymphadenopathy measuring to 2.2 cm LEFT level IIa. VASCULAR: Normal. LIMITED INTRACRANIAL: Normal. VISUALIZED ORBITS: Normal. MASTOIDS AND VISUALIZED PARANASAL SINUSES: Well-aerated. SKELETON: Nonacute. UPPER CHEST: Lung apices are clear. 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 No superior mediastinal lymphadenopathy. OTHER: None. IMPRESSION: 1. Acute tonsillitis without peritonsillar abscess. Focally narrowed airway. 2. Reactive lymphadenopathy. Electronically Signed   By: Awilda Metroourtnay  Bloomer M.D.   On: 03/23/2018 03:31      Assessment/Plan Principal Problem:   Acute streptococcal tonsillitis/pharyngitis -Patient presents with 3 days of sore throat, painful swallowing, difficulty managing oral secretions with known diagnosis of streptococcal type G pharyngitis with inability to take antibiotics as prescribed -CT as above demonstrating significantly enlarged tonsils without abscess but demonstrated focal airway narrowing -Unasyn 3 g IV every 6 hours -Decadron 4 mg IV every 6 hours for edema -Tracheostomy tray at bedside in the event has urgent airway obstruction -Admit to stepdown unit -Allow for clears if able to tolerate in context of severe odynophagia -Toradol 15 mg IV 6 hours -Morphine 1-4 mg IV every 2 hours for severe pain -Tylenol for fever and mild pain -Zofran for nausea -Continuous pulse oximetry and  oxygen prn  Active Problems:   Odynophagia -Secondary to significantly enlarged tonsils in the context of acute strep G pharyngitis -Symptom management as above -Clear liquids initially and advance as tolerated    Acute dehydration -IV fluid at 125/hr -Follow labs     History of asthma -Patient reports as childhood asthma and states is currently asymptomatic    Ongoing tobacco abuse -Smokes about 2-3 cigarettes/day -Offered nicotine patch but declined    GERD (gastroesophageal reflux disease) -Continue PPI and H2 blocker especially in the context of requirement for IV NSAIDs and steroids    Gender identity: Female identifies as female -Patient recently initiated on testosterone injections -Patient identifies by first name of Samantha -Patient still having menses    Depression/Anxiety -Not on chronic medication -Significant family history of both parents and all siblings with mental health issues    **Additional lab, imaging and/or diagnostic evaluation at discretion of supervising physician  DVT prophylaxis: Lovenox Code Status: Full Family Communication: Friend at bedside Disposition Plan: Home Consults called: None    ELLIS,ALLISON L. ANP-BC [facility] Pager [phone]   If 7PM-7AM, please contact night-coverage [email or web address] Password [email or web address]  03/23/2018, 9:51 AM

## 2018-03-23 NOTE — ED Provider Notes (Signed)
[facility]   [id number] 03/20/18 Arrival Time: 1958  ASSESSMENT & PLAN:  1. Strep throat     Meds ordered this encounter  Medications  . amoxicillin (AMOXIL) 875 MG tablet    Sig: Take 1 tablet (875 mg total) by mouth 2 (two) times daily for 10 days.    Dispense:  20 tablet    Refill:  0   Labs Reviewed  POCT RAPID STREP A - Abnormal; Notable for the following components:      Result Value   Streptococcus, Group A Screen (Direct) POSITIVE (*)    All other components within normal limits   OTC analgesics and throat care as needed. Feels that he can swallow medication.  Instructed to finish full 10 day course of antibiotics. Will follow up if not showing significant improvement over the next 24-48 hours.  Reviewed expectations re: course of current medical issues. Questions answered. Outlined signs and symptoms indicating need for more acute intervention. Patient verbalized understanding. After Visit Summary given.   SUBJECTIVE:  Samantha Hunter is a 17 y.o. adult who reports a sore throat. Describes as pain with swallowing. Onset abrupt beginning today. No respiratory symptoms. Tolerating PO intake but reports discomfort with swallowing. Fever reported: yes, subjective. No associated n/v/abdominal symptoms. No neck swelling or neck pain. Sick contacts: none.  OTC treatment: analgesics without much help.  ROS: As per HPI.   OBJECTIVE:  Vitals:   03/20/18 2004  BP: 132/70  Pulse: (!) 110  Resp: 18  Temp: 99.2 F (37.3 C)  SpO2: 99%    Tachycardia noted.  General appearance: alert; no distress HEENT: throat with moderate erythema with bilateral tonsillar hypertrophy; uvula midline yes; no sign of abscess Neck: supple with FROM; bilateral small cervical LAD with tenderness Lungs: clear to auscultation bilaterally Skin: reveals no rash; warm and dry Psychological: alert and cooperative; normal mood and affect  Allergies  Allergen Reactions  .  Pineapple Hives and Itching    Past Medical History:  Diagnosis Date  . Allergy   . Anemia   . Anxiety   . Asthma   . Cannabis use disorder, mild, abuse 03/16/2017  . Depression   . Gender dysphoria 05/12/2017  . GERD (gastroesophageal reflux disease)   . Non-suicidal self harm 03/16/2017  . OCD (obsessive compulsive disorder) 03/16/2017  . Suicidal ideation 03/16/2017   Social History   Socioeconomic History  . Marital status: Single    Spouse name: Not on file  . Number of children: Not on file  . Years of education: Not on file  . Highest education level: Not on file  Occupational History  . Not on file  Social Needs  . Financial resource strain: Not on file  . Food insecurity:    Worry: Not on file    Inability: Not on file  . Transportation needs:    Medical: Not on file    Non-medical: Not on file  Tobacco Use  . Smoking status: Current Every Day Smoker    Packs/day: 0.25    Years: 1.00    Pack years: 0.25    Types: Cigarettes  . Smokeless tobacco: Former Neurosurgeon    Types: Chew  Substance and Sexual Activity  . Alcohol use: No  . Drug use: Yes    Frequency: 14.0 times per week    Types: Marijuana  . Sexual activity: Never  Lifestyle  . Physical activity:    Days per week: Not on file    Minutes per session:  Not on file  . Stress: Not on file  Relationships  . Social connections:    Talks on phone: Not on file    Gets together: Not on file    Attends religious service: Not on file    Active member of club or organization: Not on file    Attends meetings of clubs or organizations: Not on file    Relationship status: Not on file  . Intimate partner violence:    Fear of current or ex partner: Not on file    Emotionally abused: Not on file    Physically abused: Not on file    Forced sexual activity: Not on file  Other Topics Concern  . Not on file  Social History Narrative  . Not on file   Family History  Problem Relation Age of Onset  . Mental illness  Mother   . Alcohol abuse Mother   . Drug abuse Mother   . Depression Mother   . Hyperlipidemia Mother   . Mental illness Father   . Alcohol abuse Father   . Drug abuse Father   . Depression Father   . Cancer Father        skin  . Heart disease Father   . Hyperlipidemia Father   . Stroke Father   . Mental illness Brother   . Anxiety disorder Brother   . Depression Brother   . Anxiety disorder Sister   . Depression Sister   . Mental illness Sister   . Alcohol abuse Maternal Uncle   . Drug abuse Maternal Uncle   . Alcohol abuse Maternal Grandfather   . Mental illness Brother   . Mental illness Zadie RhineSister           Lucius Wise, MD 03/23/18 [phone]

## 2018-03-24 DIAGNOSIS — J0301 Acute recurrent streptococcal tonsillitis: Secondary | ICD-10-CM

## 2018-03-24 LAB — COMPREHENSIVE METABOLIC PANEL
ALT: 20 U/L (ref 14–54)
AST: 19 U/L (ref 15–41)
Albumin: 2.9 g/dL — ABNORMAL LOW (ref 3.5–5.0)
Alkaline Phosphatase: 49 U/L (ref 38–126)
Anion gap: 10 (ref 5–15)
BUN: 10 mg/dL (ref 6–20)
CHLORIDE: 109 mmol/L (ref 101–111)
CO2: 21 mmol/L — AB (ref 22–32)
Calcium: 8.7 mg/dL — ABNORMAL LOW (ref 8.9–10.3)
Creatinine, Ser: 0.63 mg/dL (ref 0.44–1.00)
Glucose, Bld: 97 mg/dL (ref 65–99)
POTASSIUM: 4.2 mmol/L (ref 3.5–5.1)
SODIUM: 140 mmol/L (ref 135–145)
Total Bilirubin: 0.4 mg/dL (ref 0.3–1.2)
Total Protein: 7 g/dL (ref 6.5–8.1)

## 2018-03-24 LAB — CBC
HEMATOCRIT: 37 % (ref 36.0–46.0)
Hemoglobin: 11.6 g/dL — ABNORMAL LOW (ref 12.0–15.0)
MCH: 24.6 pg — AB (ref 26.0–34.0)
MCHC: 31.4 g/dL (ref 30.0–36.0)
MCV: 78.6 fL (ref 78.0–100.0)
Platelets: 254 10*3/uL (ref 150–400)
RBC: 4.71 MIL/uL (ref 3.87–5.11)
RDW: 18.4 % — ABNORMAL HIGH (ref 11.5–15.5)
WBC: 5.1 10*3/uL (ref 4.0–10.5)

## 2018-03-24 LAB — MRSA PCR SCREENING: MRSA by PCR: NEGATIVE

## 2018-03-24 MED ORDER — PROMETHAZINE HCL 25 MG/ML IJ SOLN
12.5000 mg | Freq: Once | INTRAMUSCULAR | Status: AC
Start: 2018-03-24 — End: 2018-03-24
  Administered 2018-03-24: 12.5 mg via INTRAVENOUS
  Filled 2018-03-24: qty 1

## 2018-03-24 MED ORDER — CHLORHEXIDINE GLUCONATE 0.12 % MT SOLN
15.0000 mL | Freq: Four times a day (QID) | OROMUCOSAL | Status: DC
  Administered 2018-03-24 – 2018-03-25 (×5): 15 mL via OROMUCOSAL
  Filled 2018-03-24 (×5): qty 15

## 2018-03-24 MED ORDER — PHENOL 1.4 % MT LIQD
1.0000 | OROMUCOSAL | Status: DC | PRN
  Administered 2018-03-24: 1 via OROMUCOSAL
  Filled 2018-03-24: qty 177

## 2018-03-24 MED ORDER — PANTOPRAZOLE SODIUM 40 MG PO PACK
40.0000 mg | PACK | Freq: Every day | ORAL | Status: DC
  Administered 2018-03-25: 40 mg via ORAL
  Filled 2018-03-24: qty 20

## 2018-03-24 NOTE — Progress Notes (Addendum)
PROGRESS NOTE    Samantha Hunter  ZHY:[id number] DOB: 2001-02-05 DOA: 03/23/2018 PCP: Patient, No Pcp Per   Brief Narrative:  Patient is a 17 year old female who identifies as being female with medical history significant for childhood asthma, ongoing tobacco use, GERD, anxiety, depression, streptococcal throat infections in the past who presented to the emergency department with sore throat and  inability to eat.  Patient was diagnosed with strep throat about 3 days ago and was started on oral amoxicillin but was unable to take.  Patient admitted for IV antibiotics.  CT of the neck revealed acute tonsillitis without peritonsillar abscess with a focally narrowed airway.  Assessment & Plan:   Principal Problem:   Acute streptococcal tonsillitis Active Problems:   Odynophagia   Depression   Anxiety   Asthma   GERD (gastroesophageal reflux disease)   Acute dehydration   Gender identity: Female identifies as female  Acute cervical tonsillitis/pharyngitis: Presented with 3 days of sore throat, odynophagia, difficulty managing oral secretions.  Diagnosed with a streptococcal type G pharyngitis.  Failed antibiotic treatment because he was unable to take orally.  Started on Unasyn here. Throat pain is improving.  Patient is afebrile.  White cell counts are stable.  Also started on  Steroids. Continue supportive care.  Pain management.  Tylenol for fever and pain.  Respiratory status stable.  Her Lungs are clear.  Odynophagia: Much improved.  Will advance her diet to full liquid.  Dehydration: Continue IV fluids.  History of asthma: From childhood.  Currently stable.  No wheezes on auscultation.  Ongoing tobacco abuse: Smokes 2-3 cigarettes a day.  Offered nicotine patch but declined.  GERD: Continue PPI.  Gender identity: Actually a female but identifies as female.  Patient initiated on testosterone injection.  Patient still having menses.  Depression/anxiety: Not on chronic medications.   Significant family history of mental health issues in the family.  DVT prophylaxis: Lovenox Code Status: Full Family Communication: Friend at bedside Disposition Plan: Home tomorrow with oral antibiotics   Consultants: None  Procedures: None  Antimicrobials: Unasyn since 03/23/18  Subjective: Patient seen and examined the bedside this morning.  Was sleeping.  Feeling better.  Tonsils still significantly enlarged  Objective: Vitals:   03/24/18 0452 03/24/18 0517 03/24/18 0737 03/24/18 0750  BP: 98/66 121/79 129/81   Pulse: 72  65   Resp: 18  14   Temp: 98.6 F (37 C)  97.7 F (36.5 C) (!) 97.5 F (36.4 C)  TempSrc: Oral  Oral Oral  SpO2: 100%  98%   Weight:      Height:        Intake/Output Summary (Last 24 hours) at 03/24/2018 1058 Last data filed at 03/24/2018 2952 Gross per 24 hour  Intake 1629.58 ml  Output 1200 ml  Net 429.58 ml   Filed Weights   03/23/18 2026  Weight: 106.3 kg (234 lb 6.4 oz)    Examination:  General exam: Appears calm and comfortable ,Not in distress,average built HEENT:PERRL,Oral mucosa moist, enlarged and inflamed tonsils touching each other , white patches noted on the tonsils  respiratory system: Bilateral equal air entry, normal vesicular breath sounds, no wheezes or crackles  Cardiovascular system: S1 & S2 heard, RRR. No JVD, murmurs, rubs, gallops or clicks. No pedal edema. Gastrointestinal system: Abdomen is nondistended, soft and nontender. No organomegaly or masses felt. Normal bowel sounds heard. Central nervous system: Alert and oriented. No focal neurological deficits. Extremities: No edema, no clubbing ,no cyanosis, distal peripheral pulses palpable.  Skin: No rashes, lesions or ulcers,no icterus ,no pallor MSK: Normal muscle bulk,tone ,power Psychiatry: Judgement and insight appear normal. Mood & affect appropriate.     Data Reviewed: I have personally reviewed following labs and imaging studies  CBC: Recent Labs  Lab  03/23/18 0054 03/24/18 0240  WBC 12.6* 5.1  NEUTROABS 4.2  --   HGB 12.3 11.6*  HCT 38.5 37.0  MCV 76.5* 78.6  PLT 257 254   Basic Metabolic Panel: Recent Labs  Lab 03/23/18 0054 03/24/18 0240  NA 136 140  K 4.1 4.2  CL 103 109  CO2 21* 21*  GLUCOSE 93 97  BUN 13 10  CREATININE 1.01* 0.63  CALCIUM 9.0 8.7*   GFR: Estimated Creatinine Clearance (by C-G formula based on SCr of 0.63 mg/dL) Female: 562137 mL/min Female: 166.8 mL/min Liver Function Tests: Recent Labs  Lab 03/23/18 0054 03/24/18 0240  AST 27 19  ALT 26 20  ALKPHOS 62 49  BILITOT 0.6 0.4  PROT 7.9 7.0  ALBUMIN 3.6 2.9*   No results for input(s): LIPASE, AMYLASE in the last 168 hours. No results for input(s): AMMONIA in the last 168 hours. Coagulation Profile: No results for input(s): INR, PROTIME in the last 168 hours. Cardiac Enzymes: No results for input(s): CKTOTAL, CKMB, CKMBINDEX, TROPONINI in the last 168 hours. BNP (last 3 results) No results for input(s): PROBNP in the last 8760 hours. HbA1C: No results for input(s): HGBA1C in the last 72 hours. CBG: No results for input(s): GLUCAP in the last 168 hours. Lipid Profile: No results for input(s): CHOL, HDL, LDLCALC, TRIG, CHOLHDL, LDLDIRECT in the last 72 hours. Thyroid Function Tests: No results for input(s): TSH, T4TOTAL, FREET4, T3FREE, THYROIDAB in the last 72 hours. Anemia Panel: No results for input(s): VITAMINB12, FOLATE, FERRITIN, TIBC, IRON, RETICCTPCT in the last 72 hours. Sepsis Labs: Recent Labs  Lab 03/23/18 0103  LATICACIDVEN 0.96    Recent Results (from the past 240 hour(s))  MRSA PCR Screening     Status: None   Collection Time: 03/24/18  4:54 AM  Result Value Ref Range Status   MRSA by PCR NEGATIVE NEGATIVE Final    Comment:        The GeneXpert MRSA Assay (FDA approved for NASAL specimens only), is one component of a comprehensive MRSA colonization surveillance program. It is not intended to diagnose  MRSA infection nor to guide or monitor treatment for MRSA infections. Performed at [facility], [address] [place], [place] [address]          Radiology Studies: Ct Soft Tissue Neck W Contrast  Result Date: 03/23/2018 CLINICAL DATA:  Sore throat for 3 days, diagnosed with strep throat 3 days ago. Unable to take antibiotics or swallow since yesterday. EXAM: CT NECK WITH CONTRAST TECHNIQUE: Multidetector CT imaging of the neck was performed using the standard protocol following the bolus administration of intravenous contrast. CONTRAST:  100mL ISOVUE-300 IOPAMIDOL (ISOVUE-300) INJECTION 61% COMPARISON:  None. FINDINGS: PHARYNX AND LARYNX: Symmetrically enlarged bilateral palatine tonsils with striated enhancement focally narrowing the airway. No fluid collection. Preservation of parapharyngeal fat planes. Normal larynx. SALIVARY GLANDS: Normal. THYROID: Normal. LYMPH NODES: Homogeneously enhancing upper cervical lymphadenopathy measuring to 2.2 cm LEFT level IIa. VASCULAR: Normal. LIMITED INTRACRANIAL: Normal. VISUALIZED ORBITS: Normal. MASTOIDS AND VISUALIZED PARANASAL SINUSES: Well-aerated. SKELETON: Nonacute. UPPER CHEST: Lung apices are clear. 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 No superior mediastinal lymphadenopathy. OTHER: None. IMPRESSION: 1.  Acute tonsillitis without peritonsillar abscess. Focally narrowed airway. 2. Reactive lymphadenopathy. Electronically Signed   By: Awilda Metro M.D.   On: 03/23/2018 03:31        Scheduled Meds: . dexamethasone  4 mg Intravenous Q6H  . enoxaparin (LOVENOX) injection  40 mg Subcutaneous Daily  . ketorolac  15 mg Intravenous Q6H  . pantoprazole (PROTONIX) IV  40 mg Intravenous Q24H  . pneumococcal 23 valent vaccine  0.5 mL Intramuscular Tomorrow-1000  . sodium chloride flush  3 mL Intravenous Q12H   Continuous Infusions: . sodium chloride 125 mL/hr at 03/24/18 0438   . ampicillin-sulbactam (UNASYN) IV Stopped (03/24/18 0954)  . famotidine (PEPCID) IV Stopped (03/24/18 0954)     LOS: 1 day    Time spent: 25 mins.More than 50% of that time was spent in counseling and/or coordination of care.  Please Note: This patient record was dictated using Animal nutritionist. Chart creation errors have been sought, but may not always have been located. Such creation errors do not reflect on the Standard of Medical Care.     Burnadette Pop, MD [facility] Pager [phone]  If 7PM-7AM, please contact night-coverage [email or web address] Password [facility] 03/24/2018, 10:58 AM

## 2018-03-25 LAB — PATHOLOGIST SMEAR REVIEW: Path Review: REACTIVE

## 2018-03-25 MED ORDER — AMOXICILLIN-POT CLAVULANATE 875-125 MG PO TABS: 1.0000 | ORAL_TABLET | Freq: Two times a day (BID) | ORAL | 0 refills | Status: AC

## 2018-03-25 MED ORDER — FAMOTIDINE 20 MG PO TABS: 20.0000 mg | ORAL_TABLET | Freq: Two times a day (BID) | ORAL | Status: DC

## 2018-03-25 MED ORDER — MOTRIN IB 200 MG PO TABS: 200.0000 mg | ORAL_TABLET | Freq: Four times a day (QID) | ORAL | 0 refills | Status: DC | PRN

## 2018-03-25 MED ORDER — FAMOTIDINE 20 MG PO TABS
20.0000 mg | ORAL_TABLET | Freq: Two times a day (BID) | ORAL | 0 refills | Status: DC
Start: 2018-03-25 — End: 2018-07-01

## 2018-03-25 MED ORDER — CHLORHEXIDINE GLUCONATE 0.12 % MT SOLN: 15.0000 mL | Freq: Four times a day (QID) | OROMUCOSAL | 0 refills | Status: DC

## 2018-03-25 NOTE — Progress Notes (Signed)
Felecia JanKathryn Sluder to be D/C'd Home per MD order.  Discussed with the patient and all questions fully answered.  VSS, Skin clean, dry and intact without evidence of skin break down, no evidence of skin tears noted. IV catheter discontinued intact. Site without signs and symptoms of complications. Dressing and pressure applied.  An After Visit Summary was printed and given to the patient. Patient received prescription.  D/c education completed with patient/family including follow up instructions, medication list, d/c activities limitations if indicated, with other d/c instructions as indicated by MD - patient able to verbalize understanding, all questions fully answered.   Patient instructed to return to ED, call 911, or call MD for any changes in condition.   Patient waiting on mother to arrive to drive home. Will continue to assess.  Grayling Congressvan J Alberto Schoch 03/25/2018 1:14 PM

## 2018-03-25 NOTE — Progress Notes (Signed)
Pt's Mother arrived and pt D/C'd home with her.

## 2018-03-25 NOTE — Discharge Summary (Signed)
Physician Discharge Summary  Felecia JanKathryn Leiter WUJ:[id number] DOB: 11/01/2001 DOA: 03/23/2018  PCP: Patient, No Pcp Per  Admit date: 03/23/2018 Discharge date: 03/25/2018  Admitted From: Home Disposition:  Home  Discharge Condition:Stable CODE STATUS:Full Diet recommendation: Soft diet  Brief/Interim Summary:  Patient is a 17 year old female who identifies as being female with medical history significant for childhood asthma, ongoing tobacco use, GERD, anxiety, depression, streptococcal throat infections in the past who presented to the emergency department with sore throat and  inability to eat.  Patient was diagnosed with strep throat about 3 days ago and was started on oral amoxicillin but was unable to take.  Patient admitted for IV antibiotics.  CT of the neck revealed acute tonsillitis without peritonsillar abscess with a focally narrowed airway. Patient was started on IV antibiotics and steroids.  Started on clear liquid diet and gradually progressed to full liquid.  Patient has remained afebrile.  Her  white cell counts are stable. Throat pain better today. She is stable to be discharged home today with oral antibiotics.  She will follow-up with ENT as an outpatient.  Following problems were addressed during her hospitalization:  Acute cervical tonsillitis/pharyngitis: Presented with 3 days of sore throat, odynophagia, difficulty managing oral secretions.  Diagnosed with a streptococcal type G pharyngitis.  Failed antibiotic treatment because he was unable to take orally.  Started on Unasyn here. Throat pain is improving.  Patient is afebrile.  White cell counts are stable.  Also started on  Steroids. Respiratory status stable.  Her Lungs are clear. She will follow up with ENT as an outpatient.  Odynophagia: Much improved.  Will advance her diet to soft on discharge.  Dehydration: Treated with IV fluids.  History of asthma: From childhood.  Currently stable.  No wheezes on  auscultation.  Ongoing tobacco abuse: Smokes 2-3 cigarettes a day.  Offered nicotine patch but declined.  GERD: Continue PPI.  Gender identity: Actually a female but identifies as female.  Patient initiated on testosterone injection.  Patient still having menses.  Depression/anxiety: Not on chronic medications.  Significant family history of mental health issues in the family.     Discharge Diagnoses:  Principal Problem:   Acute streptococcal tonsillitis Active Problems:   Odynophagia   Depression   Anxiety   Asthma   GERD (gastroesophageal reflux disease)   Acute dehydration   Gender identity: Female identifies as female    Discharge Instructions  Discharge Instructions    Diet general   Complete by:  As directed    Soft diet   Discharge instructions   Complete by:  As directed    1) Take prescribed medications as instructed. 2) Follow up with ENT as an outpatient.Name and number of the provider has been attached. 3) Take soft diet only for 2-3 days.   Increase activity slowly   Complete by:  As directed      Allergies as of 03/25/2018      Reactions   Pineapple Hives, Itching      Medication List    STOP taking these medications   amoxicillin 875 MG tablet Commonly known as:  AMOXIL   PREVIDENT 5000 BOOSTER 1.1 % Pste Generic drug:  Sodium Fluoride     TAKE these medications   amoxicillin-clavulanate 875-125 MG tablet Commonly known as:  AUGMENTIN Take 1 tablet by mouth 2 (two) times daily for 5 days.   chlorhexidine 0.12 % solution Commonly known as:  PERIDEX Use as directed 15 mLs in the mouth or throat  4 (four) times daily.   famotidine 20 MG tablet Commonly known as:  PEPCID Take 1 tablet (20 mg total) by mouth 2 (two) times daily.   hydrOXYzine 50 MG tablet Commonly known as:  ATARAX/VISTARIL Take 1 tablet (50 mg total) by mouth 3 (three) times daily as needed for anxiety.   MOTRIN IB 200 MG tablet Generic drug:  ibuprofen Take 1  tablet (200 mg total) by mouth every 6 (six) hours as needed for up to 5 days.   testosterone cypionate 200 MG/ML injection Commonly known as:  DEPOTESTOSTERONE CYPIONATE Inject 1 mL (200 mg total) into the muscle every 14 (fourteen) days.      Follow-up Information    Keturah Barre, MD. Schedule an appointment as soon as possible for a visit in 1 week(s).   Specialty:  Otolaryngology Contact information: [address] [place] [address] [phone]          Allergies  Allergen Reactions  . Pineapple Hives and Itching    Consultations:  None   Procedures/Studies: Ct Soft Tissue Neck W Contrast  Result Date: 03/23/2018 CLINICAL DATA:  Sore throat for 3 days, diagnosed with strep throat 3 days ago. Unable to take antibiotics or swallow since yesterday. EXAM: CT NECK WITH CONTRAST TECHNIQUE: Multidetector CT imaging of the neck was performed using the standard protocol following the bolus administration of intravenous contrast. CONTRAST:  ISOVUE-300 IOPAMIDOL (ISOVUE-300) INJECTION 61% COMPARISON:  None. FINDINGS: PHARYNX AND LARYNX: Symmetrically enlarged bilateral palatine tonsils with striated enhancement focally narrowing the airway. No fluid collection. Preservation of parapharyngeal fat planes. Normal larynx. SALIVARY GLANDS: Normal. THYROID: Normal. LYMPH NODES: Homogeneously enhancing upper cervical lymphadenopathy measuring to 2.2 cm LEFT level IIa. VASCULAR: Normal. LIMITED INTRACRANIAL: Normal. VISUALIZED ORBITS: Normal. MASTOIDS AND VISUALIZED PARANASAL SINUSES: Well-aerated. SKELETON: Nonacute. UPPER CHEST: Lung apices are clear. 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 No superior mediastinal lymphadenopathy. OTHER: None. IMPRESSION: 1. Acute tonsillitis without peritonsillar abscess. Focally narrowed airway. 2. Reactive lymphadenopathy. Electronically Signed   By: Awilda Metro M.D.   On:  03/23/2018 03:31      Subjective: Patient seen and examined the bedside this morning.  Still complains of throat pain but is better than yesterday.  Stable for discharge to home on oral antibiotics.  Discharge Exam: Vitals:   03/25/18 0600 03/25/18 0616  BP:  111/70  Pulse: (!) 57 65  Resp: 13 13  Temp:    SpO2: 97% 96%   Vitals:   03/25/18 0400 03/25/18 0500 03/25/18 0600 03/25/18 0616  BP:    111/70  Pulse: 70 61 (!) 57 65  Resp: (!) 22 17 13 13   Temp:      TempSrc:      SpO2: (!) 87% (!) 88% 97% 96%  Weight:      Height:        General: Pt is alert, awake, not in acute distress Cardiovascular: RRR, S1/S2 +, no rubs, no gallops Respiratory: CTA bilaterally, no wheezing, no rhonchi Abdominal: Soft, NT, ND, bowel sounds + Extremities: no edema, no cyanosis ENT: Enlarged ,inflammed tonsils.    The results of significant diagnostics from this hospitalization (including imaging, microbiology, ancillary and laboratory) are listed below for reference.     Microbiology: Recent Results (from the past 240 hour(s))  MRSA PCR Screening     Status: None   Collection Time: 03/24/18  4:54 AM  Result Value Ref Range Status   MRSA  by PCR NEGATIVE NEGATIVE Final    Comment:        The GeneXpert MRSA Assay (FDA approved for NASAL specimens only), is one component of a comprehensive MRSA colonization surveillance program. It is not intended to diagnose MRSA infection nor to guide or monitor treatment for MRSA infections. Performed at [facility], [address], [place], [place] [address]      Labs: BNP (last 3 results) No results for input(s): BNP in the last 8760 hours. Basic Metabolic Panel: Recent Labs  Lab 03/23/18 0054 03/24/18 0240  NA 136 140  K 4.1 4.2  CL 103 109  CO2 21* 21*  GLUCOSE 93 97  BUN 13 10  CREATININE 1.01* 0.63  CALCIUM 9.0 8.7*   Liver Function Tests: Recent Labs  Lab 03/23/18 0054 03/24/18 0240  AST 27 19  ALT 26 20   ALKPHOS 62 49  BILITOT 0.6 0.4  PROT 7.9 7.0  ALBUMIN 3.6 2.9*   No results for input(s): LIPASE, AMYLASE in the last 168 hours. No results for input(s): AMMONIA in the last 168 hours. CBC: Recent Labs  Lab 03/23/18 0054 03/24/18 0240  WBC 12.6* 5.1  NEUTROABS 4.2  --   HGB 12.3 11.6*  HCT 38.5 37.0  MCV 76.5* 78.6  PLT 257 254   Cardiac Enzymes: No results for input(s): CKTOTAL, CKMB, CKMBINDEX, TROPONINI in the last 168 hours. BNP: Invalid input(s): POCBNP CBG: No results for input(s): GLUCAP in the last 168 hours. D-Dimer No results for input(s): DDIMER in the last 72 hours. Hgb A1c No results for input(s): HGBA1C in the last 72 hours. Lipid Profile No results for input(s): CHOL, HDL, LDLCALC, TRIG, CHOLHDL, LDLDIRECT in the last 72 hours. Thyroid function studies No results for input(s): TSH, T4TOTAL, T3FREE, THYROIDAB in the last 72 hours.  Invalid input(s): FREET3 Anemia work up No results for input(s): VITAMINB12, FOLATE, FERRITIN, TIBC, IRON, RETICCTPCT in the last 72 hours. Urinalysis    Component Value Date/Time   COLORURINE YELLOW 06/01/2017 1811   APPEARANCEUR HAZY (A) 06/01/2017 1811   LABSPEC 1.016 06/01/2017 1811   PHURINE 6.0 06/01/2017 1811   GLUCOSEU NEGATIVE 06/01/2017 1811   HGBUR NEGATIVE 06/01/2017 1811   BILIRUBINUR NEGATIVE 06/01/2017 1811   KETONESUR NEGATIVE 06/01/2017 1811   PROTEINUR NEGATIVE 06/01/2017 1811   NITRITE NEGATIVE 06/01/2017 1811   LEUKOCYTESUR TRACE (A) 06/01/2017 1811   Sepsis Labs Invalid input(s): PROCALCITONIN,  WBC,  LACTICIDVEN Microbiology Recent Results (from the past 240 hour(s))  MRSA PCR Screening     Status: None   Collection Time: 03/24/18  4:54 AM  Result Value Ref Range Status   MRSA by PCR NEGATIVE NEGATIVE Final    Comment:        The GeneXpert MRSA Assay (FDA approved for NASAL specimens only), is one component of a comprehensive MRSA colonization surveillance program. It is not intended to  diagnose MRSA infection nor to guide or monitor treatment for MRSA infections. Performed at [facility], [address], [place], [place] [address]      Time coordinating discharge: 35 minutes  SIGNED:   Burnadette Pop, MD  [facility] 03/25/2018, 11:41 AM Pager [phone]  If 7PM-7AM, please contact night-coverage [email or web address] Password [email or web address]

## 2018-06-14 ENCOUNTER — Ambulatory Visit: Payer: Managed Care, Other (non HMO) | Admitting: Family Medicine

## 2018-07-01 ENCOUNTER — Ambulatory Visit: Payer: Managed Care, Other (non HMO) | Admitting: Family Medicine

## 2018-07-01 ENCOUNTER — Encounter: Payer: Self-pay | Admitting: Family Medicine

## 2018-07-01 ENCOUNTER — Other Ambulatory Visit: Payer: Self-pay

## 2018-07-01 VITALS — BP 90/58 | HR 83 | Temp 97.8°F | Ht 65.0 in | Wt 225.6 lb

## 2018-07-01 DIAGNOSIS — Z7952 Long term (current) use of systemic steroids: Secondary | ICD-10-CM | POA: Diagnosis not present

## 2018-07-01 DIAGNOSIS — Z79890 Hormone replacement therapy (postmenopausal): Secondary | ICD-10-CM | POA: Diagnosis not present

## 2018-07-01 DIAGNOSIS — D500 Iron deficiency anemia secondary to blood loss (chronic): Secondary | ICD-10-CM

## 2018-07-01 DIAGNOSIS — I951 Orthostatic hypotension: Secondary | ICD-10-CM

## 2018-07-01 DIAGNOSIS — Z79899 Other long term (current) drug therapy: Secondary | ICD-10-CM | POA: Diagnosis not present

## 2018-07-01 DIAGNOSIS — F640 Transsexualism: Secondary | ICD-10-CM

## 2018-07-01 DIAGNOSIS — Z789 Other specified health status: Secondary | ICD-10-CM

## 2018-07-01 LAB — POCT CBC
Granulocyte percent: 36.7 %G — AB (ref 37–80)
HEMATOCRIT: 38.8 % (ref 37.7–47.9)
Hemoglobin: 12.1 g/dL — AB (ref 12.2–16.2)
Lymph, poc: 3.1 (ref 0.6–3.4)
MCH, POC: 25.3 pg — AB (ref 27–31.2)
MCHC: 31.3 g/dL — AB (ref 31.8–35.4)
MCV: 80.7 fL (ref 80–97)
MID (cbc): 0.3 (ref 0–0.9)
MPV: 7.6 fL (ref 0–99.8)
POC Granulocyte: 1.9 — AB (ref 2–6.9)
POC LYMPH PERCENT: 58.2 %L — AB (ref 10–50)
POC MID %: 5.1 %M (ref 0–12)
Platelet Count, POC: 315 10*3/uL (ref 142–424)
RBC: 4.81 M/uL (ref 4.04–5.48)
RDW, POC: 19.2 %
WBC: 5.3 10*3/uL (ref 4.6–10.2)

## 2018-07-01 MED ORDER — SYRINGE (DISPOSABLE) 1 ML MISC: 1.0000 [IU] | 0 refills | Status: DC | PRN

## 2018-07-01 MED ORDER — TESTOSTERONE CYPIONATE 200 MG/ML IM SOLN: 120.0000 mg | INTRAMUSCULAR | 0 refills | Status: DC

## 2018-07-01 MED ORDER — "NEEDLE (DISP) 23G X 3/4"" MISC": 1.0000 [IU] | 0 refills | Status: DC | PRN

## 2018-07-01 MED ORDER — "NEEDLE (DISP) 18G X 1-1/2"" MISC": 1.0000 [IU] | 0 refills | Status: DC | PRN

## 2018-07-01 NOTE — Progress Notes (Signed)
Subjective:  By signing my name below, I, Samantha Hunter, attest that this documentation has been prepared under the direction and in the presence of Samantha Sorenson, MD. Electronically Signed: Stann Hunter, Scribe. 07/01/2018 , 9:36 AM .  Patient was seen in Room 3 .   Patient ID: Samantha Hunter, adult    DOB: 03/06/2001, 17 y.o.   MRN: [id number] Chief Complaint  Patient presents with  . Medication Refill    Testosterone   HPI Samantha Hunter "[name]" is a 17 y.o. FTM adult who presents to Primary Care at [facility] requesting medication refill. Patient states he's ran out of medication refill about 2.5 months ago. He started testosterone injections in Oct, previously followed by Dr. Ruby Cola. He was doing 0.6 mL every 14 days. His menses stopped after 1 month of injections, but menses returned 1.5 week ago. He doesn't want children, and his spouse had a vasectomy. His family lives in [place]; he lives with his spouse. He's currently off psychiatric medications, feeling fine after starting testosterone therapy.   He noticed low BP starting 2 months ago with lightheadedness, nausea, and syncope. He reports eating and drinking normally, as well as even adding in Gatorade.   He was seen at the ER in [facility] for right flank pain 1 month prior, diagnosed with UTI; prescribed Keflex and tramadol. In the year prior to starting testosterone, [name] was on Abilify, gabapentin and prazosin. He had full labs, ultrasound, CT prior to UTI diagnosis.   He works at the [employer], a [employer] and [employer] for [employer].   Past Medical History:  Diagnosis Date  . Allergy   . Anemia   . Anxiety   . Asthma   . Cannabis use disorder, mild, abuse 03/16/2017  . Depression   . Gender dysphoria 05/12/2017  . GERD (gastroesophageal reflux disease)   . Non-suicidal self harm 03/16/2017  . OCD (obsessive compulsive disorder) 03/16/2017  . Suicidal ideation 03/16/2017   Past Surgical History:  Procedure Laterality Date  . wisdom  tooth removal Bilateral    Prior to Admission medications   Medication Sig Start Date End Date Taking? Authorizing Provider  chlorhexidine (PERIDEX) 0.12 % solution Use as directed 15 mLs in the mouth or throat 4 (four) times daily. 03/25/18   Samantha Pop, MD  famotidine (PEPCID) 20 MG tablet Take 1 tablet (20 mg total) by mouth 2 (two) times daily. 03/25/18   Samantha Pop, MD  hydrOXYzine (ATARAX/VISTARIL) 50 MG tablet Take 1 tablet (50 mg total) by mouth 3 (three) times daily as needed for anxiety. 08/29/17   Arfeen, Phillips Grout, MD  testosterone cypionate (DEPOTESTOSTERONE CYPIONATE) 200 MG/ML injection Inject 1 mL (200 mg total) into the muscle every 14 (fourteen) days. 02/08/18   McVey, Madelaine Bhat, PA-C   Allergies  Allergen Reactions  . Pineapple Hives and Itching   Family History  Problem Relation Age of Onset  . Mental illness Mother   . Alcohol abuse Mother   . Drug abuse Mother   . Depression Mother   . Hyperlipidemia Mother   . Mental illness Father   . Alcohol abuse Father   . Drug abuse Father   . Depression Father   . Cancer Father        skin  . Heart disease Father   . Hyperlipidemia Father   . Stroke Father   . Mental illness Brother   . Anxiety disorder Brother   . Depression Brother   . Anxiety disorder Sister   .  Depression Sister   . Mental illness Sister   . Alcohol abuse Maternal Uncle   . Drug abuse Maternal Uncle   . Alcohol abuse Maternal Grandfather   . Mental illness Brother   . Mental illness Sister    Social History   Socioeconomic History  . Marital status: Single    Spouse name: Not on file  . Number of children: Not on file  . Years of education: Not on file  . Highest education level: Not on file  Occupational History  . Not on file  Social Needs  . Financial resource strain: Not on file  . Food insecurity:    Worry: Not on file    Inability: Not on file  . Transportation needs:    Medical: Not on file    Non-medical: Not  on file  Tobacco Use  . Smoking status: Former Smoker    Packs/day: 0.25    Years: 1.00    Pack years: 0.25    Types: Cigarettes    Last attempt to quit: 02/08/2018    Years since quitting: 0.3  . Smokeless tobacco: Former Neurosurgeon    Types: Chew  Substance and Sexual Activity  . Alcohol use: No  . Drug use: Yes    Frequency: 14.0 times per week    Types: Marijuana  . Sexual activity: Never  Lifestyle  . Physical activity:    Days per week: Not on file    Minutes per session: Not on file  . Stress: Not on file  Relationships  . Social connections:    Talks on phone: Not on file    Gets together: Not on file    Attends religious service: Not on file    Active member of club or organization: Not on file    Attends meetings of clubs or organizations: Not on file    Relationship status: Not on file  Other Topics Concern  . Not on file  Social History Narrative  . Not on file   Depression screen [facility] 2/9 02/08/2018  Decreased Interest 0  Down, Depressed, Hopeless 0  PHQ - 2 Score 0  Some encounter information is confidential and restricted. Go to Review Flowsheets activity to see all data.    Review of Systems  Constitutional: Negative for chills, fatigue, fever and unexpected weight change.  Respiratory: Negative for cough.   Gastrointestinal: Negative for constipation, diarrhea, nausea and vomiting.  Skin: Negative for rash and wound.  Neurological: Negative for dizziness, weakness and headaches.       Objective:   Physical Exam  Constitutional: He is oriented to person, place, and time. He appears well-developed and well-nourished. No distress.  HENT:  Head: Normocephalic and atraumatic.  Eyes: Pupils are equal, round, and reactive to light. EOM are normal.  Neck: Neck supple.  Cardiovascular: Normal rate.  Pulmonary/Chest: Effort normal. No respiratory distress.  Musculoskeletal: Normal range of motion.  Neurological: He is alert and oriented to person, place, and  time.  Skin: Skin is warm and dry.  Psychiatric: He has a normal mood and affect. His behavior is normal.  Nursing note and vitals reviewed.   BP (!) 90/58 (BP Location: Left Arm, Patient Position: Sitting, Cuff Size: Large)   Pulse 83   Temp 97.8 F (36.6 C) (Oral)   Ht 5\' 5"  (1.651 m)   Wt 225 lb 9.6 oz (102.3 kg)   LMP 06/23/2018   SpO2 99%   BMI 37.54 kg/m   Results for orders placed  or performed in visit on 07/01/18  POCT CBC  Result Value Ref Range   WBC 5.3 4.6 - 10.2 K/uL   Lymph, poc 3.1 0.6 - 3.4   POC LYMPH PERCENT 58.2 (A) 10 - 50 %L   MID (cbc) 0.3 0 - 0.9   POC MID % 5.1 0 - 12 %M   POC Granulocyte 1.9 (A) 2 - 6.9   Granulocyte percent 36.7 (A) 37 - 80 %G   RBC 4.81 4.04 - 5.48 M/uL   Hemoglobin 12.1 (A) 12.2 - 16.2 g/dL   HCT, POC 96.0 45.4 - 47.9 %   MCV 80.7 80 - 97 fL   MCH, POC 25.3 (A) 27 - 31.2 pg   MCHC 31.3 (A) 31.8 - 35.4 g/dL   RDW, POC 09.8 %   Platelet Count, POC 315 142 - 424 K/uL   MPV 7.6 0 - 99.8 fL   Orthostatic VS for the past 24 hrs (Last 3 readings):  BP- Lying Pulse- Lying BP- Sitting Pulse- Sitting BP- Standing at 0 minutes Pulse- Standing at 0 minutes  07/01/18 0947 113/76 80 112/80 88 110/80 98   Not positive ortostatics - BP appropriately stable.     Assessment & Plan:  Will have him recheck in 3 months, have him come in 1 week prior for labs, will discuss results at visit  1. Long term (current) use of systemic steroids   2. Encounter for long-term current use of high risk medication   3. Female-to-female transgender person   4. Long-term current use of testosterone cypionate   5. Iron deficiency anemia due to chronic blood loss   6. Orthostatic hypotension     Orders Placed This Encounter  Procedures  . Comprehensive metabolic panel  . TestT+TestF+SHBG  . Estradiol  . Iron, TIBC and Ferritin Panel  . CBC with Differential/Platelet    Standing Status:   Future    Standing Expiration Date:   07/02/2019  .  Comprehensive metabolic panel    Standing Status:   Future    Standing Expiration Date:   07/02/2019  . TestT+TestF+SHBG    Standing Status:   Future    Standing Expiration Date:   07/02/2019  . Estradiol    Standing Status:   Future    Standing Expiration Date:   07/02/2019  . Iron, TIBC and Ferritin Panel  . POCT CBC    Meds ordered this encounter  Medications  . testosterone cypionate (DEPOTESTOSTERONE CYPIONATE) 200 MG/ML injection    Sig: Inject 0.6 mLs (120 mg total) into the muscle every 14 (fourteen) days.    Dispense:  4 mL    Refill:  0  . Syringe, Disposable, 1 ML MISC    Sig: 1 Units by Does not apply route as needed.    Dispense:  100 each    Refill:  0  . NEEDLE, DISP, 18 G 18G X 1-1/2" MISC    Sig: 1 Units by Does not apply route as needed.    Dispense:  100 each    Refill:  0  . NEEDLE, DISP, 23 G 23G X 3/4" MISC    Sig: 1 Units by Does not apply route as needed.    Dispense:  100 each    Refill:  0    I personally performed the services described in this documentation, which was scribed in my presence. The recorded information has been reviewed and considered, and addended by me as needed.   Samantha Hunter, M.D.  [facility]  [place] [address] [place] [id number] [phone] phone [phone]) [phone] fax  09/02/18 9:44 AM

## 2018-07-01 NOTE — Patient Instructions (Addendum)
You are deficient on iron which is causing an anemia (low red blood cells).  I recommend starting a daily iron supplement like ferrous sulfate 364m (670mof elemental iron), which has the highest iron dose of the products commonly available over-the-counter, for 1-2 months to build back up your levels.  It is best absorbed on an empty stomach 30 minutes before eating or 2 hours after a meal with a vitamin C supplement or a small glass of orange juice. This can cause significant constipation in some patients, in which case you may need to switch to a slow release, lower dose supplement like Nu-Iron or Slow-Fe.  I have also included a list of high iron foods below.  I recommend coming in to have your blood drawn in a lab-only visit up to 3-10 days prior to our next scheduled office visit so that the results are available for us[place]oreao review together and discuss any implications during your visit.  You may [address] any time (M-F 8-6, Sat 8-4) to have your blood drawn without an appointment as I have already ordered the "future" labs in your chart. Make sure to let the front desk know that you are there for a lab-only visit and that you do not need to see a provider.  You do not need to be fasting.     IF you received an x-ray today, you will receive an invoice from [facility]. Please contact [facility] at [phone] questions or concerns regarding your invoice.   IF you received labwork today, you will receive an invoice from [place] at [phone] questions or concerns regarding your invoice.   Our billing staff will not be able to assist you with questions regarding bills from these companies.  You will be contacted with the lab results as soon as they are available. The fastest way to get your results is to activate your My Chart account. Instructions are located on the last page of this paperwork. If you  have not heard from usKoreaegarding the results in 2 weeks, please contact this office.      Iron-Rich Diet Iron is a mineral that helps your body to produce hemoglobin. Hemoglobin is a protein in your red blood cells that carries oxygen to your body's tissues. Eating too little iron may cause you to feel weak and tired, and it can increase your risk for infection. Eating enough iron is necessary for your body's metabolism, muscle function, and nervous system. Iron is naturally found in many foods. It can also be added to foods or fortified in foods. There are two types of dietary iron:  Heme iron. Heme iron is absorbed by the body more easily than nonheme iron. Heme iron is found in meat, poultry, and fish.  Nonheme iron. Nonheme iron is found in dietary supplements, iron-fortified grains, beans, and vegetables.  You may need to follow an iron-rich diet if:  You have been diagnosed with iron deficiency or iron-deficiency anemia.  You have a condition that prevents you from absorbing dietary iron, such as: ? Infection in your intestines. ? Celiac disease. This involves long-lasting (chronic) inflammation of your intestines.  You do not eat enough iron.  You eat a diet that is high in foods that impair iron absorption.  You have lost a lot of blood.  You have heavy bleeding during your menstrual cycle.  You are pregnant.  What is my plan? Your health care  provider may help you to determine how much iron you need per day based on your condition. Generally, when a person consumes sufficient amounts of iron in the diet, the following iron needs are met:  Men. ? 62-80 years old: 11 mg per day. ? 32-43 years old: 8 mg per day.  Women. ? 67-41 years old: 15 mg per day. ? 41-69 years old: 18 mg per day. ? Over 39 years old: 8 mg per day. ? Pregnant women: 27 mg per day. ? Breastfeeding women: 9 mg per day.  What do I need to know about an iron-rich diet?  Eat fresh fruits and  vegetables that are high in vitamin C along with foods that are high in iron. This will help increase the amount of iron that your body absorbs from food, especially with foods containing nonheme iron. Foods that are high in vitamin C include oranges, peppers, tomatoes, and mango.  Take iron supplements only as directed by your health care provider. Overdose of iron can be life-threatening. If you were prescribed iron supplements, take them with orange juice or a vitamin C supplement.  Cook foods in pots and pans that are made from iron.  Eat nonheme iron-containing foods alongside foods that are high in heme iron. This helps to improve your iron absorption.  Certain foods and drinks contain compounds that impair iron absorption. Avoid eating these foods in the same meal as iron-rich foods or with iron supplements. These include: ? Coffee, black tea, and red wine. ? Milk, dairy products, and foods that are high in calcium. ? Beans, soybeans, and peas. ? Whole grains.  When eating foods that contain both nonheme iron and compounds that impair iron absorption, follow these tips to absorb iron better. ? Soak beans overnight before cooking. ? Soak whole grains overnight and drain them before using. ? Ferment flours before baking, such as using yeast in bread dough. What foods can I eat? Grains Iron-fortified breakfast cereal. Iron-fortified whole-wheat bread. Enriched rice. Sprouted grains. Vegetables Spinach. Potatoes with skin. Green peas. Broccoli. Red and green bell peppers. Fermented vegetables. Fruits Prunes. Raisins. Oranges. Strawberries. Mango. Grapefruit. Meats and Other Protein Sources Beef liver. Oysters. Beef. Shrimp. [place]. Chicken. [place]. Sardines. Chickpeas. Nuts. Tofu. Beverages Tomato juice. Fresh orange juice. Prune juice. Hibiscus tea. Fortified instant breakfast shakes. Condiments Tahini. Fermented soy sauce. Sweets and Desserts Black-strap molasses. Other Wheat  germ. The items listed above may not be a complete list of recommended foods or beverages. Contact your dietitian for more options. What foods are not recommended? Grains Whole grains. Bran cereal. Bran flour. Oats. Vegetables Artichokes. Brussels sprouts. Kale. Fruits Blueberries. Raspberries. Strawberries. Figs. Meats and Other Protein Sources Soybeans. Products made from soy protein. Dairy Milk. Cream. Cheese. Yogurt. Cottage cheese. Beverages Coffee. Black tea. Red wine. Sweets and Desserts Cocoa. Chocolate. Ice cream. Other Basil. Oregano. Parsley. The items listed above may not be a complete list of foods and beverages to avoid. Contact your dietitian for more information. This information is not intended to replace advice given to you by your health care provider. Make sure you discuss any questions you have with your health care provider. Document Released: 07/11/2005 Document Revised: 06/16/2016 Document Reviewed: 06/24/2014 Elsevier Interactive Patient Education  2018 [employer].  Orthostatic Hypotension Orthostatic hypotension is a sudden drop in blood pressure that happens when you quickly change positions, such as when you get up from a seated or lying position. Blood pressure is a measurement of how strongly, or weakly,  your blood is pressing against the walls of your arteries. Arteries are blood vessels that carry blood from your heart throughout your body. When blood pressure is too low, you may not get enough blood to your brain or to the rest of your organs. This can cause weakness, light-headedness, rapid heartbeat, and fainting. This can last for just a few seconds or for up to a few minutes. Orthostatic hypotension is usually not a serious problem. However, if it happens frequently or gets worse, it may be a sign of something more serious. What are the causes? This condition may be caused by:  Sudden changes in posture, such as standing up quickly after you have  been sitting or lying down.  Blood loss.  Loss of body fluids (dehydration).  Heart problems.  Hormone (endocrine) problems.  Pregnancy.  Severe infection.  Lack of certain nutrients.  Severe allergic reactions (anaphylaxis).  Certain medicines, such as blood pressure medicine or medicines that make the body lose excess fluids (diuretics). Sometimes, this condition can be caused by not taking medicine as directed, such as taking too much of a certain medicine.  What increases the risk? Certain factors can make you more likely to develop orthostatic hypotension, including:  Age. Risk increases as you get older.  Conditions that affect the heart or the central nervous system.  Taking certain medicines, such as blood pressure medicine or diuretics.  Being pregnant.  What are the signs or symptoms? Symptoms of this condition may include:  Weakness.  Light-headedness.  Dizziness.  Blurred vision.  Fatigue.  Rapid heartbeat.  Fainting, in severe cases.  How is this diagnosed? This condition is diagnosed based on:  Your medical history.  Your symptoms.  Your blood pressure measurement. Your health care provider will check your blood pressure when you are: ? Lying down. ? Sitting. ? Standing.  A blood pressure reading is recorded as two numbers, such as "120 over 80" (or 120/80). The first ("top") number is called the systolic pressure. It is a measure of the pressure in your arteries as your heart beats. The second ("bottom") number is called the diastolic pressure. It is a measure of the pressure in your arteries when your heart relaxes between beats. Blood pressure is measured in a unit called mm Hg. Healthy blood pressure for adults is 120/80. If your blood pressure is below 90/60, you may be diagnosed with hypotension. Other information or tests that may be used to diagnose orthostatic hypotension include:  Your other vital signs, such as your heart rate  and temperature.  Blood tests.  Tilt table test. For this test, you will be safely secured to a table that moves you from a lying position to an upright position. Your heart rhythm and blood pressure will be monitored during the test.  How is this treated? Treatment for this condition may include:  Changing your diet. This may involve eating more salt (sodium) or drinking more water.  Taking medicines to raise your blood pressure.  Changing the dosage of certain medicines you are taking that might be lowering your blood pressure.  Wearing compression stockings. These stockings help to prevent blood clots and reduce swelling in your legs.  In some cases, you may need to go to the hospital for:  Fluid replacement. This means you will receive fluids through an IV tube.  Blood replacement. This means you will receive donated blood through an IV tube (transfusion).  Treating an infection or heart problems, if this applies.  Monitoring.  You may need to be monitored while medicines that you are taking wear off.  Follow these instructions at home: Eating and drinking   Drink enough fluid to keep your urine clear or pale yellow.  Eat a healthy diet and follow instructions from your health care provider about eating or drinking restrictions. A healthy diet includes: ? Fresh fruits and vegetables. ? Whole grains. ? Lean meats. ? Low-fat dairy products.  Eat extra salt only as directed. Do not add extra salt to your diet unless your health care provider told you to do that.  Eat frequent, small meals.  Avoid standing up suddenly after eating. Medicines  Take over-the-counter and prescription medicines only as told by your health care provider. ? Follow instructions from your health care provider about changing the dosage of your current medicines, if this applies. ? Do not stop or adjust any of your medicines on your own. General instructions  Wear compression stockings as told  by your health care provider.  Get up slowly from lying down or sitting positions. This gives your blood pressure a chance to adjust.  Avoid hot showers and excessive heat as directed by your health care provider.  Return to your normal activities as told by your health care provider. Ask your health care provider what activities are safe for you.  Do not use any products that contain nicotine or tobacco, such as cigarettes and e-cigarettes. If you need help quitting, ask your health care provider.  Keep all follow-up visits as told by your health care provider. This is important. Contact a health care provider if:  You vomit.  You have diarrhea.  You have a fever for more than 2-3 days.  You feel more thirsty than usual.  You feel weak and tired. Get help right away if:  You have chest pain.  You have a fast or irregular heartbeat.  You develop numbness in any part of your body.  You cannot move your arms or your legs.  You have trouble speaking.  You become sweaty or feel lightheaded.  You faint.  You feel short of breath.  You have trouble staying awake.  You feel confused. This information is not intended to replace advice given to you by your health care provider. Make sure you discuss any questions you have with your health care provider. Document Released: 11/17/2002 Document Revised: 08/15/2016 Document Reviewed: 05/19/2016 Elsevier Interactive Patient Education  2018 [employer].

## 2018-07-02 LAB — COMPREHENSIVE METABOLIC PANEL
ALT: 11 IU/L (ref 0–32)
AST: 13 IU/L (ref 0–40)
Albumin/Globulin Ratio: 1.5 (ref 1.2–2.2)
Albumin: 3.9 g/dL (ref 3.5–5.5)
Alkaline Phosphatase: 53 IU/L (ref 39–117)
BUN / CREAT RATIO: 11 (ref 9–23)
BUN: 7 mg/dL (ref 6–20)
Bilirubin Total: <0.2 mg/dL (ref 0.0–1.2)
CO2: 23 mmol/L (ref 20–29)
CREATININE: 0.63 mg/dL (ref 0.57–1.00)
Calcium: 8.8 mg/dL (ref 8.7–10.2)
Chloride: 107 mmol/L — ABNORMAL HIGH (ref 96–106)
GFR, EST AFRICAN AMERICAN: 150 mL/min/{1.73_m2} (ref >59)
GFR, EST NON AFRICAN AMERICAN: 130 mL/min/{1.73_m2} (ref >59)
Globulin, Total: 2.6 g/dL (ref 1.5–4.5)
Glucose: 92 mg/dL (ref 65–99)
Potassium: 4.1 mmol/L (ref 3.5–5.2)
Sodium: 144 mmol/L (ref 134–144)
TOTAL PROTEIN: 6.5 g/dL (ref 6.0–8.5)

## 2018-07-02 LAB — IRON,TIBC AND FERRITIN PANEL
FERRITIN: 11 ng/mL — AB (ref 15–77)
IRON SATURATION: 7 % — AB (ref 15–55)
IRON: 23 ug/dL — AB (ref 27–159)
Total Iron Binding Capacity: 332 ug/dL (ref 250–450)
UIBC: 309 ug/dL (ref 131–425)

## 2018-07-02 LAB — TESTT+TESTF+SHBG
Sex Hormone Binding: 26.4 nmol/L (ref 24.6–122.0)
TESTOSTERONE, TOTAL: 472.3 ng/dL — AB (ref 10.0–55.0)
Testosterone, Free: 13 pg/mL

## 2018-07-02 LAB — ESTRADIOL: ESTRADIOL: 48.2 pg/mL

## 2018-07-19 ENCOUNTER — Telehealth: Payer: Self-pay | Admitting: Family Medicine

## 2018-07-19 NOTE — Telephone Encounter (Signed)
Called pt. To reschedule appt. On 10/03/18. Rescheduled °

## 2018-08-23 ENCOUNTER — Telehealth: Payer: Self-pay | Admitting: Family Medicine

## 2018-08-23 NOTE — Telephone Encounter (Signed)
Called pt. To reschedule visit with Dr. Clelia CroftShaw on 10/04/18. If pt. Calls back please reschedule with Dr. Leretha PolSantiago or Dr. Clelia CroftShaw

## 2018-08-27 ENCOUNTER — Encounter: Payer: Self-pay | Admitting: Physician Assistant

## 2018-08-27 ENCOUNTER — Ambulatory Visit: Payer: Managed Care, Other (non HMO) | Admitting: Physician Assistant

## 2018-08-27 ENCOUNTER — Other Ambulatory Visit: Payer: Self-pay

## 2018-08-27 VITALS — BP 104/76 | HR 80 | Temp 98.0°F | Resp 16 | Ht 65.0 in | Wt 232.0 lb

## 2018-08-27 DIAGNOSIS — L03011 Cellulitis of right finger: Secondary | ICD-10-CM

## 2018-08-27 MED ORDER — CEPHALEXIN 500 MG PO CAPS: 500.0000 mg | ORAL_CAPSULE | Freq: Three times a day (TID) | ORAL | 0 refills | Status: DC

## 2018-08-27 MED ORDER — MUPIROCIN 2 % EX OINT: 1.0000 "application " | TOPICAL_OINTMENT | Freq: Three times a day (TID) | CUTANEOUS | 1 refills | Status: DC

## 2018-08-27 MED ORDER — CEPHALEXIN 500 MG PO CAPS: 500.0000 mg | ORAL_CAPSULE | Freq: Three times a day (TID) | ORAL | 0 refills | Status: AC

## 2018-08-27 NOTE — Patient Instructions (Addendum)
  You will need to soak your finger after the incision and drainage procedure: ?Soak the affected finger in warm water for 15 minutes, 3 times a day. ?Put antibiotic cream such as mupirocin (brand name: Bactroban Cream) on the infected area after soaking it.  If your finger worsens or is not improving in 5-7 days, start taking Keflex '500mg'$  3x/day x 5 days.  Can paronychia be prevented?-You can reduce your chances of getting paronychia if you: ?Push your cuticles down gently and do not trim or cut them. ?Wear rubber gloves if you need to put your hands in water. ---------------------------------------------------------- ------------------------------ ----------------------------------- Please do not use Q-tips, as this will further impact the ear wax in your ear.   If you have cerumen impaction more than once a year you can reduce the occurrences by using a cotton ball dipped in mineral oil and place it in the external canal for 10-20 minutes once a week (combined with eight hours of not using a hearing aid overnight, if applicable). This helps liquify cerumen and aid in the normal elimination mechanisms.    You can also try a liquid stool softener. It works by dissolving or loosening the earwax, allowing it to be more easily removed upon irrigation. Lie on your side with the affected ear facing up and instill warmed (not hot) 1 mL (about 15 drops) of liquid Colace into the ear. Let the liquid sit in the ear for 10-15 minutes so it can work. After 15 minutes, rinse the affected ear with warm water so that you can get the softened earwax out of your canal.    If you have itchy ears, use sweet oil. If you need more relief use a small amount of hydrocortisone ointment.   Thank you for coming in today. I hope you feel we met your needs.  Feel free to call PCP if you have any questions or further requests.  Please consider signing up for MyChart if you do not already have it, as this is a great  way to communicate with me.  Best,  [employer], PA-C

## 2018-08-27 NOTE — Progress Notes (Signed)
Samantha Hunter  MRN: [id number] DOB: 25-Apr-2001  PCP: Sherren Mocha, MD  Subjective:  Pt is a 17 year old FTM who presents to clinic for infection of right index finger x 2 days. Recently increased in pain and "started turning colors"  Denies joint pain, drainage, fever, chills.   Review of Systems  Constitutional: Negative for chills and fever.  Musculoskeletal: Negative for arthralgias and joint swelling.  Skin: Positive for wound.    Patient Active Problem List   Diagnosis Date Noted  . Odynophagia 03/23/2018  . Depression 03/23/2018  . Anxiety 03/23/2018  . Asthma 03/23/2018  . GERD (gastroesophageal reflux disease) 03/23/2018  . Gender identity: Female identifies as female 03/23/2018  . Narrowing of airway   . 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 Long-term current use of testosterone cypionate 02/08/2018  . MDD (major depressive disorder), recurrent episode (HCC) 06/01/2017  . Gender dysphoria 05/12/2017  . 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 Cannabis use disorder, mild, abuse 03/16/2017  . OCD (obsessive compulsive disorder) 03/16/2017  . Suicidal ideation 03/16/2017  . Non-suicidal self harm 03/16/2017  . MDD (major depressive disorder), recurrent severe, without psychosis (HCC) 03/15/2017    Current Outpatient Medications on File Prior to Visit  Medication Sig Dispense Refill  . NEEDLE, DISP, 18 G 18G X 1-1/2" MISC 1 Units by Does not apply route as needed. 100 each 0  . NEEDLE, DISP, 23 G 23G X 3/4" MISC 1 Units by Does not apply route as needed. 100 each 0  . QUEtiapine (SEROQUEL) 25 MG tablet Take 25 mg by mouth at bedtime.    . sertraline (ZOLOFT) 100 MG tablet Take 100 mg by mouth daily.    . Syringe, Disposable, 1 ML MISC 1 Units by Does not apply route as needed. 100 each 0  . testosterone cypionate (DEPOTESTOSTERONE CYPIONATE) 200 MG/ML injection Inject 0.6 mLs (120 mg total) into the muscle  every 14 (fourteen) days. 4 mL 0   No current facility-administered medications on file prior to visit.     Allergies  Allergen Reactions  . Pineapple Hives and Itching     Objective:  BP 104/76 (BP Location: Left Arm, Patient Position: Sitting, Cuff Size: Large)   Pulse 80   Temp 98 F (36.7 C) (Oral)   Resp 16   Ht 5\' 5"  (1.651 m)   Wt 232 lb (105.2 kg)   SpO2 97%   BMI 38.61 kg/m   Physical Exam  Constitutional: He is oriented to person, place, and time. He appears well-developed and well-nourished.  Neurological: He is alert and oriented to person, place, and time.  Skin: Skin is warm and dry.     Psychiatric: He has a normal mood and affect. His behavior is normal. Judgment and thought content normal.  Vitals reviewed.  Procedure: Verbal consent obtained. Skin was cleaned with alcohol and anesthetized with 2% lidocaine without epi. A <1 cm incision was made along cuticle. A moderate amount of purulent material expressed. Wound dressed and wound care discussed.  Assessment and Plan :  1. Paronychia of finger of right hand - Pt presents with paronychia. I&D performed without difficulty and tolerated well. Wound dressed and wound care discussed. Advised warm soaks and topical bactroban. If no improvement in 5 days, okay to fill keflex. RTC if no improvement.  - mupirocin ointment (BACTROBAN) 2 %; Apply 1 application topically 3 (three) times daily.  Dispense: 22 g; Refill: 1 - cephALEXin (KEFLEX)  500 MG capsule; Take 1 capsule (500 mg total) by mouth 3 (three) times daily for 5 days.  Dispense: 15 capsule; Refill: 0 - WOUND CULTURE    Marco CollieWhitney Murray Durrell, PA-C  [facility] 08/27/2018 5:15 PM  Please note: Portions of this report may have been transcribed using dragon voice recognition software. Every effort was made to ensure accuracy; however, inadvertent computerized transcription errors may be present.

## 2018-08-28 ENCOUNTER — Other Ambulatory Visit: Payer: Self-pay | Admitting: Family Medicine

## 2018-08-28 DIAGNOSIS — D500 Iron deficiency anemia secondary to blood loss (chronic): Secondary | ICD-10-CM

## 2018-08-28 DIAGNOSIS — Z79890 Hormone replacement therapy (postmenopausal): Secondary | ICD-10-CM

## 2018-08-28 NOTE — Telephone Encounter (Signed)
Testosterone refill Last Refill:07/01/18 # 4 ml with 0 refill Last OV: 08/27/18 PCP: Dr. Clelia CroftShaw [facility]

## 2018-08-30 LAB — WOUND CULTURE

## 2018-09-02 ENCOUNTER — Encounter: Payer: Self-pay | Admitting: Family Medicine

## 2018-09-12 ENCOUNTER — Ambulatory Visit: Payer: 59 | Admitting: Psychiatry

## 2018-09-12 ENCOUNTER — Ambulatory Visit: Payer: Self-pay | Admitting: Psychiatry

## 2018-09-12 DIAGNOSIS — F422 Mixed obsessional thoughts and acts: Secondary | ICD-10-CM

## 2018-09-12 NOTE — Progress Notes (Signed)
      [facility] Counselor/Therapist Progress Note   Patient ID: Goldie Tregoning, MRN: [id number]  Date: 09/12/2018  Timespent: 50 minutes  Treatment Type: Individual  Subjective: Patient was present for session.  Patient reported they are continuing to have lots of intrusive thoughts.  Patient admitted to only taking medication full dose once in a while due to sleepiness.  Patient was encouraged to try and take it at least full dose every other day to help improve functioning and decrease the intrusive thoughts.  Patient went on to explain current changes in the living situation including someone else living in their apartment and getting a new puppy.  Was encouraged to recognize the stress that may be causing which could be increasing the intrusive thoughts.  Worked with patient on CBT skills to try and stop or redirect the intrusive thoughts.  Patient was taught thought feeling versus fact exercise through CBT.  Patient agreed that it made sense and will try the activity on a regular basis.  Patient was challenged also whenever the intrusive thoughts come to list out 5 facts to think about instead of giving energy to the intrusive thoughts.  Interventions:CBT, Solution Focused and Supportive  Mental Status Exam:   Appearance:   Casual     Behavior:  Agitated  Motor:  Restlestness  Speech/Language:   Normal Rate  Affect:  Appropriate  Mood:  anxious  Thought process:  Intact  Thought content:    Obsessions and Paranoid Ideation  Perceptual disturbances:    Normal  Orientation:  Full (Time, Place, and Person)  Attention:  Fair  Concentration:  down slightly  Memory:  Immediate  Fund of knowledge:   Good  Insight:    Fair  Judgment:   Good  Impulse Control:  good    Reported Symptoms: intrusive thoughts, sleep issues, anxeity  Risk Assessment: Danger to Self:  Patient reported intrusive thoughts to self-harm.  Patient explained that that was not a possibility for him.  Patient  explained that he recognizes the thoughts are just thoughts and would not act on them due to having positive people in his life currently and not being willing to leave them Self-injurious Behavior: No Danger to Others: No Duty to Warn:no Physical Aggression / Violence:No  Access to Firearms a concern: No  Gang Involvement:No   Diagnosis:   ICD-10-CM   1. Mixed obsessional thoughts and acts F42.2      Plan: Patient is to practice CBT exercises from session.  Patient is to work on continuing to do relaxation exercises as well. Patient was assessed for safety.  Patient reported suicidal ideation.  They can contract for safety at this time.  Patient is to return to individual sessions 2-4 times a month as needed.  Patient is to take medication as directed and a 10 medication management sessions as needed  1. Patient was given information and number of the suicide hotline.  1-800-SUICIDE, or 1-800-273-TALK. 2. Patient is to call 911, or go to an emergency room if they feel they cannot maintain safety. 3. Patient will call for an earlier appointment if they feel that they need one.   Stevphen Meuse, [place]

## 2018-09-17 ENCOUNTER — Other Ambulatory Visit: Payer: Self-pay

## 2018-09-17 ENCOUNTER — Ambulatory Visit (HOSPITAL_COMMUNITY)
Admission: EM | Admit: 2018-09-17 | Discharge: 2018-09-17 | Disposition: A | Discharge status: Home / Self Care | Payer: Managed Care, Other (non HMO) | Attending: Family Medicine | Admitting: Family Medicine

## 2018-09-17 ENCOUNTER — Encounter (HOSPITAL_COMMUNITY): Payer: Self-pay | Admitting: Emergency Medicine

## 2018-09-17 DIAGNOSIS — J40 Bronchitis, not specified as acute or chronic: Secondary | ICD-10-CM | POA: Diagnosis not present

## 2018-09-17 MED ORDER — PREDNISONE 20 MG PO TABS: ORAL_TABLET | ORAL | 0 refills | Status: DC

## 2018-09-17 MED ORDER — ALBUTEROL SULFATE HFA 108 (90 BASE) MCG/ACT IN AERS: 2.0000 | INHALATION_SPRAY | RESPIRATORY_TRACT | 1 refills | Status: DC | PRN

## 2018-09-17 MED ORDER — BENZONATATE 100 MG PO CAPS: 100.0000 mg | ORAL_CAPSULE | Freq: Three times a day (TID) | ORAL | 0 refills | Status: DC | PRN

## 2018-09-17 NOTE — ED Triage Notes (Signed)
Symptoms started 3-4 days ago.  Started wheezing.  Has a cough, and lightheaded, aching all over.

## 2018-09-17 NOTE — Discharge Instructions (Addendum)
We expect improvement over the next 48 hours.  If not, please return.

## 2018-09-17 NOTE — ED Provider Notes (Signed)
[facility]    CSN: [id number] Arrival date & time: 09/17/18  1338     History   Chief Complaint Chief Complaint  Patient presents with  . URI    HPI Samantha Hunter is a 17 y.o. adult.   Symptoms started 3-4 days ago.  Started wheezing.  Has a cough, and lightheaded, aching all over.  She denies a history of asthma.  He does admit to fair amount of cannabis use.  He has had multiple upper respiratory infections over the last 9 months.  He recognizes that smoking anything will make his respiratory tract more vulnerable to infection.     Past Medical History:  Diagnosis Date  . Allergy   . Anemia   . Anxiety   . Asthma   . Cannabis use disorder, mild, abuse 03/16/2017  . Depression   . Gender dysphoria 05/12/2017  . GERD (gastroesophageal reflux disease)   . Non-suicidal self harm 03/16/2017  . OCD (obsessive compulsive disorder) 03/16/2017  . Suicidal ideation 03/16/2017    Patient Active Problem List   Diagnosis Date Noted  . Odynophagia 03/23/2018  . Depression 03/23/2018  . Anxiety 03/23/2018  . Asthma 03/23/2018  . GERD (gastroesophageal reflux disease) 03/23/2018  . Gender identity: Female identifies as female 03/23/2018  . Narrowing of airway   . 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 Long-term current use of testosterone cypionate 02/08/2018  . MDD (major depressive disorder), recurrent episode (HCC) 06/01/2017  . Gender dysphoria 05/12/2017  . 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 Cannabis use disorder, mild, abuse 03/16/2017  . OCD (obsessive compulsive disorder) 03/16/2017  . Suicidal ideation 03/16/2017  . Non-suicidal self harm 03/16/2017  . MDD (major depressive disorder), recurrent severe, without psychosis (HCC) 03/15/2017    Past Surgical History:  Procedure Laterality Date  . wisdom tooth removal Bilateral     OB History   None      Home Medications    Prior to  Admission medications   Medication Sig Start Date End Date Taking? Authorizing Provider  albuterol (PROVENTIL HFA;VENTOLIN HFA) 108 (90 Base) MCG/ACT inhaler Inhale 2 puffs into the lungs every 4 (four) hours as needed for wheezing or shortness of breath (cough, shortness of breath or wheezing.). 09/17/18   Elvina Sidle, MD  benzonatate (TESSALON) 100 MG capsule Take 1-2 capsules (100-200 mg total) by mouth 3 (three) times daily as needed for cough. 09/17/18   Elvina Sidle, MD  mupirocin ointment (BACTROBAN) 2 % Apply 1 application topically 3 (three) times daily. 08/27/18   McVey, Madelaine Bhat, PA-C  NEEDLE, DISP, 18 G 18G X 1-1/2" MISC 1 Units by Does not apply route as needed. 07/01/18   Sherren Mocha, MD  NEEDLE, DISP, 23 G 23G X 3/4" MISC 1 Units by Does not apply route as needed. 07/01/18   Sherren Mocha, MD  predniSONE (DELTASONE) 20 MG tablet Two daily with food 09/17/18   Elvina Sidle, MD  QUEtiapine (SEROQUEL) 25 MG tablet Take 25 mg by mouth at bedtime.    [provider]  sertraline (ZOLOFT) 100 MG tablet Take 100 mg by mouth daily.    [provider]  Syringe, Disposable, 1 ML MISC 1 Units by Does not apply route as needed. 07/01/18   Sherren Mocha, MD  testosterone cypionate (DEPOTESTOSTERONE CYPIONATE) 200 MG/ML injection INJECT 0.6 MLS (120 MG TOTAL) INTO THE MUSCLE EVERY 14 (FOURTEEN) DAYS. 09/02/18   Sherren Mocha, MD  Family History Family History  Problem Relation Age of Onset  . Mental illness Mother   . Alcohol abuse Mother   . Drug abuse Mother   . Depression Mother   . Hyperlipidemia Mother   . Mental illness Father   . Alcohol abuse Father   . Drug abuse Father   . Depression Father   . Cancer Father        skin  . Heart disease Father   . Hyperlipidemia Father   . Stroke Father   . Mental illness Brother   . Anxiety disorder Brother   . Depression Brother   . Anxiety disorder Sister   . Depression Sister   . Mental illness Sister   .  Alcohol abuse Maternal Uncle   . Drug abuse Maternal Uncle   . Alcohol abuse Maternal Grandfather   . Mental illness Brother   . Mental illness Sister     Social History Social History   Tobacco Use  . Smoking status: Former Smoker    Packs/day: 0.25    Years: 1.00    Pack years: 0.25    Types: Cigarettes    Last attempt to quit: 02/08/2018    Years since quitting: 0.6  . Smokeless tobacco: Former Neurosurgeon    Types: Chew  Substance Use Topics  . Alcohol use: No  . Drug use: Yes    Frequency: 14.0 times per week    Types: Marijuana     Allergies   Pineapple   Review of Systems Review of Systems  Constitutional: Negative.   HENT: Positive for sore throat.   Respiratory: Positive for cough and wheezing.   All other systems reviewed and are negative.    Physical Exam Triage Vital Signs ED Triage Vitals  Enc Vitals Group     BP 09/17/18 1426 125/90     Pulse Rate 09/17/18 1426 79     Resp 09/17/18 1426 18     Temp 09/17/18 1426 97.7 F (36.5 C)     Temp Source 09/17/18 1426 Oral     SpO2 09/17/18 1426 100 %     Weight --      Height --      Head Circumference --      Peak Flow --      Pain Score 09/17/18 1422 4     Pain Loc --      Pain Edu? --      Excl. in GC? --    No data found.  Updated Vital Signs BP 121/74 (BP Location: Right Arm) Comment: large cuff  Pulse 79   Temp 97.7 F (36.5 C) (Oral)   Resp 18   SpO2 100%    Physical Exam  Constitutional: He is oriented to person, place, and time. He appears well-developed and well-nourished.  HENT:  Right Ear: External ear normal.  Left Ear: External ear normal.  Mouth/Throat: Oropharynx is clear and moist.  T M's are normal  Eyes: Pupils are equal, round, and reactive to light. Conjunctivae are normal.  Neck: Normal range of motion. Neck supple.  Cardiovascular: Normal rate, regular rhythm and normal heart sounds.  Pulmonary/Chest: Effort normal. He has wheezes.  Musculoskeletal: Normal range of  motion.  Neurological: He is alert and oriented to person, place, and time.  Skin: Skin is warm and dry.  Nursing note and vitals reviewed.    UC Treatments / Results  Labs (all labs ordered are listed, but only abnormal results are displayed) Labs Reviewed -  No data to display  EKG None  Radiology No results found.  Procedures Procedures (including critical care time)  Medications Ordered in UC Medications - No data to display  Initial Impression / Assessment and Plan / UC Course  I have reviewed the triage vital signs and the nursing notes.  Pertinent labs & imaging results that were available during my care of the patient were reviewed by me and considered in my medical decision making (see chart for details).     Final Clinical Impressions(s) / UC Diagnoses   Final diagnoses:  Bronchitis     Discharge Instructions     We expect improvement over the next 48 hours.  If not, please return.    ED Prescriptions    Medication Sig Dispense Auth. Provider   albuterol (PROVENTIL HFA;VENTOLIN HFA) 108 (90 Base) MCG/ACT inhaler Inhale 2 puffs into the lungs every 4 (four) hours as needed for wheezing or shortness of breath (cough, shortness of breath or wheezing.). 1 Inhaler Elvina Sidle, MD   benzonatate (TESSALON) 100 MG capsule Take 1-2 capsules (100-200 mg total) by mouth 3 (three) times daily as needed for cough. 40 capsule Elvina Sidle, MD   predniSONE (DELTASONE) 20 MG tablet Two daily with food 10 tablet Elvina Sidle, MD     Controlled Substance Prescriptions [place] Controlled Substance Registry consulted? Not Applicable   Elvina Sidle, MD 09/17/18 1451

## 2018-09-21 IMAGING — CR DG HAND COMPLETE 3+V*R*
3 series · 3 of 3 positions shown · non-contrast
Comparison: None.

CLINICAL DATA: Punched a wall with pain and bruising to the fourth
and fifth metacarpal

EXAM:
RIGHT HAND - COMPLETE 3+ VIEW

[x hand pa right]
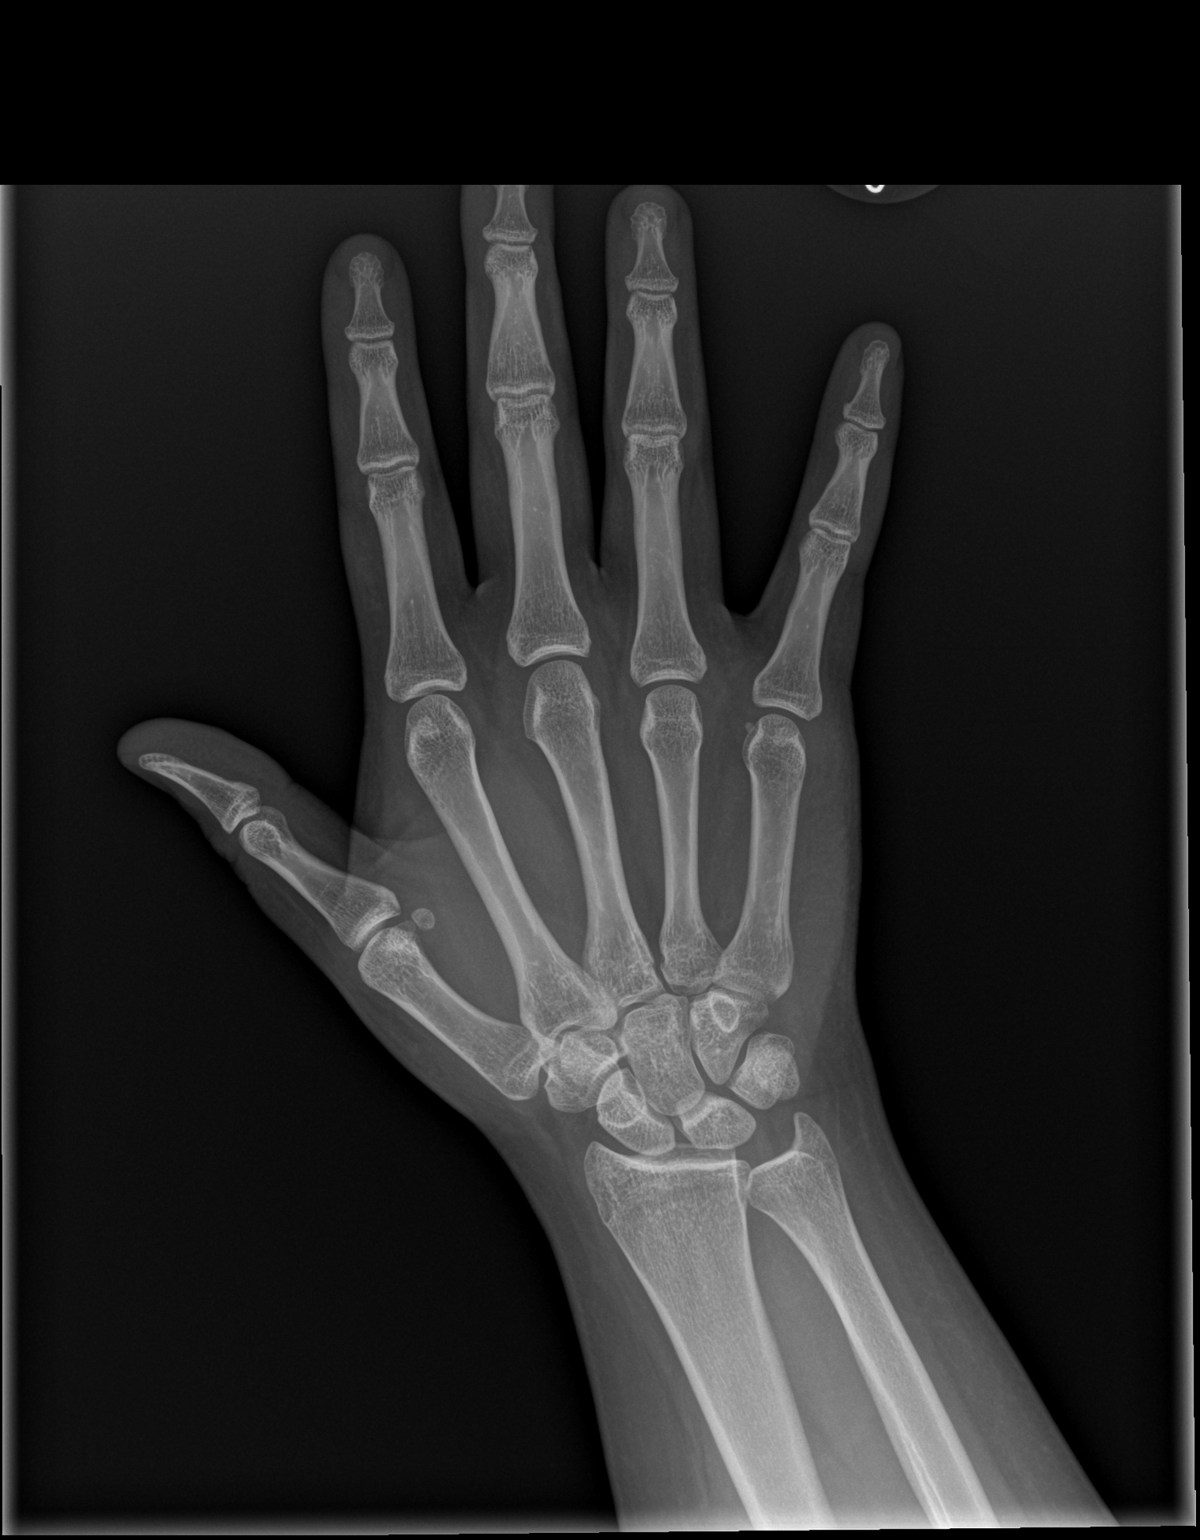

[x hand obl right]
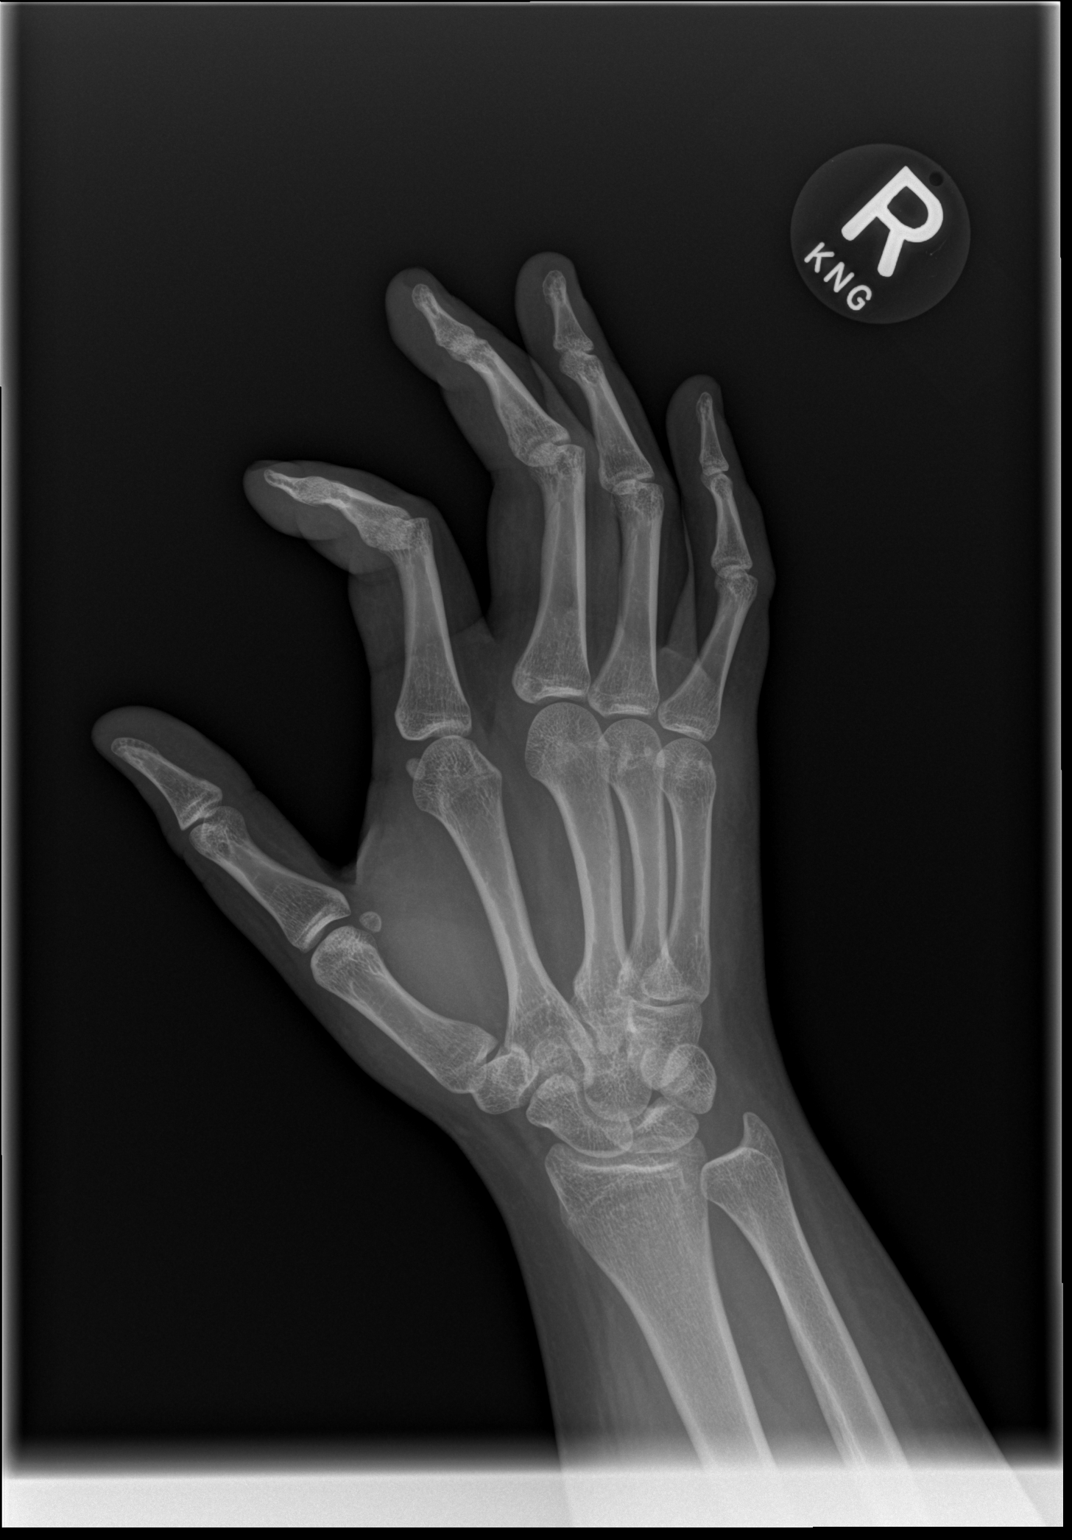

[x hand lat right]
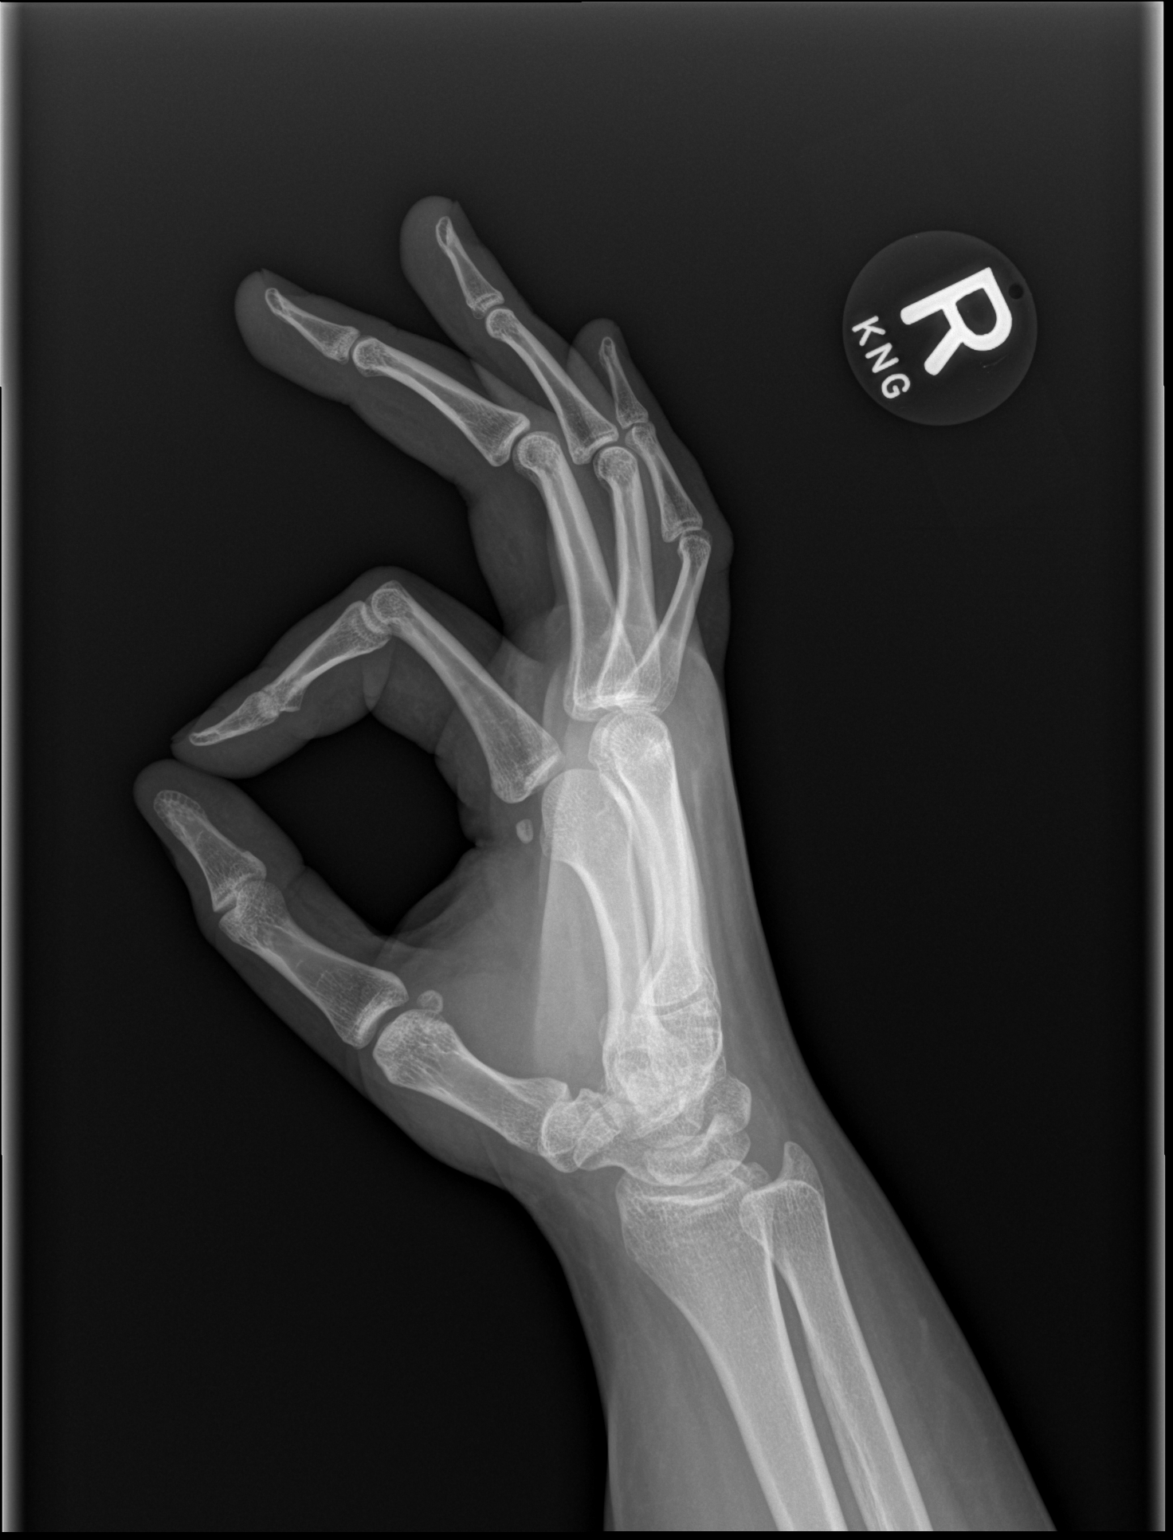

[3 of 3 positions shown; findings below may reference images not displayed]

FINDINGS: There is no evidence of fracture or dislocation. There is no
evidence of arthropathy or other focal bone abnormality. Soft
tissues are unremarkable.
IMPRESSION: Negative.

## 2018-10-03 ENCOUNTER — Ambulatory Visit: Payer: Managed Care, Other (non HMO) | Admitting: Family Medicine

## 2018-10-04 ENCOUNTER — Ambulatory Visit: Payer: Managed Care, Other (non HMO) | Admitting: Family Medicine

## 2018-10-31 ENCOUNTER — Encounter (HOSPITAL_COMMUNITY): Payer: Self-pay

## 2018-10-31 ENCOUNTER — Emergency Department (HOSPITAL_COMMUNITY)
Admission: EM | Admit: 2018-10-31 | Discharge: 2018-10-31 | Disposition: A | Discharge status: Home / Self Care | Payer: Managed Care, Other (non HMO) | Attending: Emergency Medicine | Admitting: Emergency Medicine

## 2018-10-31 DIAGNOSIS — J45901 Unspecified asthma with (acute) exacerbation: Secondary | ICD-10-CM | POA: Insufficient documentation

## 2018-10-31 DIAGNOSIS — Z79899 Other long term (current) drug therapy: Secondary | ICD-10-CM | POA: Insufficient documentation

## 2018-10-31 DIAGNOSIS — R062 Wheezing: Secondary | ICD-10-CM | POA: Diagnosis present

## 2018-10-31 DIAGNOSIS — Z87891 Personal history of nicotine dependence: Secondary | ICD-10-CM | POA: Diagnosis not present

## 2018-10-31 DIAGNOSIS — J4521 Mild intermittent asthma with (acute) exacerbation: Secondary | ICD-10-CM

## 2018-10-31 MED ORDER — DEXAMETHASONE 4 MG PO TABS
10.0000 mg | ORAL_TABLET | Freq: Once | ORAL | Status: AC
  Administered 2018-10-31: 10 mg via ORAL
  Filled 2018-10-31: qty 3

## 2018-10-31 MED ORDER — ALBUTEROL SULFATE (2.5 MG/3ML) 0.083% IN NEBU
5.0000 mg | INHALATION_SOLUTION | Freq: Once | RESPIRATORY_TRACT | Status: AC
  Administered 2018-10-31: 5 mg via RESPIRATORY_TRACT
  Filled 2018-10-31: qty 6

## 2018-10-31 MED ORDER — IPRATROPIUM BROMIDE 0.02 % IN SOLN
0.5000 mg | Freq: Once | RESPIRATORY_TRACT | Status: AC
  Administered 2018-10-31: 0.5 mg via RESPIRATORY_TRACT
  Filled 2018-10-31: qty 2.5

## 2018-10-31 NOTE — ED Triage Notes (Signed)
Pt states that asthma started to act up about 3 hours ago, audible wheezing, unrelieved by home inhaler.

## 2018-10-31 NOTE — Discharge Instructions (Addendum)
Continue your inhaler use at home as needed. If needed more than every 4 hours that is an indication that you need to be rechecked. Return to the emergency department as needed.

## 2018-10-31 NOTE — ED Provider Notes (Signed)
[facility] EMERGENCY DEPARTMENT Provider Note   CSN: [id number] Arrival date & time: 10/31/18  7829     History   Chief Complaint Chief Complaint  Patient presents with  . Asthma    HPI Samantha Hunter is a 17 y.o. adult.  Patient with a history of asthma presents with wheezing and chest tightness that started tonight. He used his inhaler without significant relief. He reports his diagnosis of asthma is recent - this year - and uses his inhaler infrequently. He continues to smoke cigarettes. No fever. He reports post-tussive emesis x 2 this evening.   The history is provided by the patient. No language interpreter was used.  Asthma  Associated symptoms include shortness of breath. Pertinent negatives include no chest pain and no abdominal pain.    Past Medical History:  Diagnosis Date  . Allergy   . Anemia   . Anxiety   . Asthma   . Cannabis use disorder, mild, abuse 03/16/2017  . Depression   . Gender dysphoria 05/12/2017  . GERD (gastroesophageal reflux disease)   . Non-suicidal self harm 03/16/2017  . OCD (obsessive compulsive disorder) 03/16/2017  . Suicidal ideation 03/16/2017    Patient Active Problem List   Diagnosis Date Noted  . Odynophagia 03/23/2018  . Depression 03/23/2018  . Anxiety 03/23/2018  . Asthma 03/23/2018  . GERD (gastroesophageal reflux disease) 03/23/2018  . Gender identity: Female identifies as female 03/23/2018  . Narrowing of airway   . 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 Long-term current use of testosterone cypionate 02/08/2018  . MDD (major depressive disorder), recurrent episode (HCC) 06/01/2017  . Gender dysphoria 05/12/2017  . 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 Cannabis use disorder, mild, abuse 03/16/2017  . OCD (obsessive compulsive disorder) 03/16/2017  . Suicidal ideation 03/16/2017  . Non-suicidal self harm 03/16/2017  . MDD (major depressive  disorder), recurrent severe, without psychosis (HCC) 03/15/2017    Past Surgical History:  Procedure Laterality Date  . wisdom tooth removal Bilateral      OB History   None      Home Medications    Prior to Admission medications   Medication Sig Start Date End Date Taking? Authorizing Provider  albuterol (PROVENTIL HFA;VENTOLIN HFA) 108 (90 Base) MCG/ACT inhaler Inhale 2 puffs into the lungs every 4 (four) hours as needed for wheezing or shortness of breath (cough, shortness of breath or wheezing.). 09/17/18   Elvina Sidle, MD  benzonatate (TESSALON) 100 MG capsule Take 1-2 capsules (100-200 mg total) by mouth 3 (three) times daily as needed for cough. 09/17/18   Elvina Sidle, MD  mupirocin ointment (BACTROBAN) 2 % Apply 1 application topically 3 (three) times daily. 08/27/18   McVey, Madelaine Bhat, PA-C  NEEDLE, DISP, 18 G 18G X 1-1/2" MISC 1 Units by Does not apply route as needed. 07/01/18   Sherren Mocha, MD  NEEDLE, DISP, 23 G 23G X 3/4" MISC 1 Units by Does not apply route as needed. 07/01/18   Sherren Mocha, MD  predniSONE (DELTASONE) 20 MG tablet Two daily with food 09/17/18   Elvina Sidle, MD  QUEtiapine (SEROQUEL) 25 MG tablet Take 25 mg by mouth at bedtime.    [provider]  sertraline (ZOLOFT) 100 MG tablet Take 100 mg by mouth daily.    [provider]  Syringe, Disposable, 1 ML MISC 1 Units by Does not apply route as needed. 07/01/18   Sherren Mocha, MD  testosterone cypionate (DEPOTESTOSTERONE CYPIONATE) 200 MG/ML injection INJECT 0.6 MLS (120 MG TOTAL) INTO THE MUSCLE EVERY 14 (FOURTEEN) DAYS. 09/02/18   Sherren Mocha, MD    Family History Family History  Problem Relation Age of Onset  . Mental illness Mother   . Alcohol abuse Mother   . Drug abuse Mother   . Depression Mother   . Hyperlipidemia Mother   . Mental illness Father   . Alcohol abuse Father   . Drug abuse Father   . Depression Father   . Cancer Father        skin  . Heart  disease Father   . Hyperlipidemia Father   . Stroke Father   . Mental illness Brother   . Anxiety disorder Brother   . Depression Brother   . Anxiety disorder Sister   . Depression Sister   . Mental illness Sister   . Alcohol abuse Maternal Uncle   . Drug abuse Maternal Uncle   . Alcohol abuse Maternal Grandfather   . Mental illness Brother   . Mental illness Sister     Social History Social History   Tobacco Use  . Smoking status: Former Smoker    Packs/day: 0.25    Years: 1.00    Pack years: 0.25    Types: Cigarettes    Last attempt to quit: 02/08/2018    Years since quitting: 0.7  . Smokeless tobacco: Former Neurosurgeon    Types: Chew  Substance Use Topics  . Alcohol use: No  . Drug use: Yes    Frequency: 14.0 times per week    Types: Marijuana     Allergies   Pineapple   Review of Systems Review of Systems  Constitutional: Negative for chills and fever.  HENT: Negative.   Respiratory: Positive for chest tightness, shortness of breath and wheezing.   Cardiovascular: Negative.  Negative for chest pain.  Gastrointestinal: Positive for vomiting (post-tussive x 2). Negative for abdominal pain.  Musculoskeletal: Negative.   Skin: Negative.   Neurological: Negative.      Physical Exam Updated Vital Signs BP 124/69   Pulse (!) 104   Temp 98 F (36.7 C) (Oral)   Resp 20   SpO2 98%   Physical Exam  Constitutional: He is oriented to person, place, and time. He appears well-developed and well-nourished. No distress.  HENT:  Head: Normocephalic.  Mouth/Throat: Oropharynx is clear and moist.  Neck: Normal range of motion. Neck supple.  Cardiovascular: Normal rate and regular rhythm.  Pulmonary/Chest: Effort normal and breath sounds normal. No respiratory distress. He has no rales. He exhibits no tenderness.  **Examined after nebulizer treatment x 1. End expiratory wheezing lower fields on left.   Abdominal: Soft. Bowel sounds are normal. There is no tenderness.  There is no rebound and no guarding.  Musculoskeletal: Normal range of motion.  Neurological: He is alert and oriented to person, place, and time.  Skin: Skin is warm and dry. No rash noted.  Psychiatric: He has a normal mood and affect.  Nursing note and vitals reviewed.    ED Treatments / Results  Labs (all labs ordered are listed, but only abnormal results are displayed) Labs Reviewed - No data to display  EKG None  Radiology No results found.  Procedures Procedures (including critical care time)  Medications Ordered in ED Medications  albuterol (PROVENTIL) (2.5 MG/3ML) 0.083% nebulizer solution 5 mg (has no administration in time range)  ipratropium (ATROVENT) nebulizer solution 0.5 mg (has no administration in  time range)  dexamethasone (DECADRON) tablet 10 mg (has no administration in time range)  albuterol (PROVENTIL) (2.5 MG/3ML) 0.083% nebulizer solution 5 mg (5 mg Nebulization Given 10/31/18 0038)     Initial Impression / Assessment and Plan / ED Course  I have reviewed the triage vital signs and the nursing notes.  Pertinent labs & imaging results that were available during my care of the patient were reviewed by me and considered in my medical decision making (see chart for details).     Patient with a recent diagnosis of asthma, uses inhaler only, continuous smoker, presents with wheezing tonight uncontrolled with inhaler. No recent fever.   Neb x 1 with "70%" improvement. He still has minimal wheezing. Will provide decadron x 1 dose and second nebulizer with Atrovent/Albuterol. Anticipate discharge home after 2nd treatment.  The patient feels much better after 2nd treatment and is requesting discharge home. Feel this is appropriate. Encourage follow up with primary care provider for recheck this week.   Final Clinical Impressions(s) / ED Diagnoses   Final diagnoses:  None   1. Asthma exacerbation  ED Discharge Orders    None       Danne HarborUpstill, Emila Steinhauser,  PA-C 10/31/18 0210    Dione BoozeGlick, David, MD 10/31/18 [id number]

## 2018-10-31 NOTE — ED Notes (Signed)
Patient verbalizes understanding of medications and discharge instructions. No further questions at this time. VSS and patient ambulatory at discharge.   

## 2018-12-06 ENCOUNTER — Encounter: Payer: Self-pay | Admitting: Family Medicine

## 2018-12-06 ENCOUNTER — Ambulatory Visit: Payer: Managed Care, Other (non HMO) | Admitting: Family Medicine

## 2018-12-06 VITALS — BP 120/77 | HR 85 | Temp 98.2°F | Resp 18 | Ht 65.0 in | Wt 249.4 lb

## 2018-12-06 DIAGNOSIS — Z7952 Long term (current) use of systemic steroids: Secondary | ICD-10-CM

## 2018-12-06 DIAGNOSIS — R42 Dizziness and giddiness: Secondary | ICD-10-CM

## 2018-12-06 DIAGNOSIS — I951 Orthostatic hypotension: Secondary | ICD-10-CM | POA: Diagnosis not present

## 2018-12-06 DIAGNOSIS — D509 Iron deficiency anemia, unspecified: Secondary | ICD-10-CM

## 2018-12-06 DIAGNOSIS — J4520 Mild intermittent asthma, uncomplicated: Secondary | ICD-10-CM

## 2018-12-06 DIAGNOSIS — Z79890 Hormone replacement therapy (postmenopausal): Secondary | ICD-10-CM

## 2018-12-06 LAB — POCT CBC
Granulocyte percent: 37.4 %G (ref 37–80)
HCT, POC: 41.6 % — AB (ref 29–41)
Hemoglobin: 14.1 g/dL (ref 11–14.6)
Lymph, poc: 2.5 (ref 0.6–3.4)
MCH: 27.4 pg (ref 27–31.2)
MCHC: 34 g/dL (ref 31.8–35.4)
MCV: 80.7 fL (ref 76–111)
MID (cbc): 0.6 (ref 0–0.9)
MPV: 7.1 fL (ref 0–99.8)
PLATELET COUNT, POC: 358 10*3/uL (ref 142–424)
POC Granulocyte: 1.9 — AB (ref 2–6.9)
POC LYMPH PERCENT: 50.9 %L — AB (ref 10–50)
POC MID %: 11.7 %M (ref 0–12)
RBC: 5.15 M/uL (ref 4.04–5.48)
RDW, POC: 18.1 %
WBC: 5 10*3/uL (ref 4.6–10.2)

## 2018-12-06 MED ORDER — ALBUTEROL SULFATE (2.5 MG/3ML) 0.083% IN NEBU: 2.5000 mg | INHALATION_SOLUTION | Freq: Four times a day (QID) | RESPIRATORY_TRACT | 1 refills | Status: DC | PRN

## 2018-12-06 MED ORDER — ALBUTEROL SULFATE (2.5 MG/3ML) 0.083% IN NEBU
2.5000 mg | INHALATION_SOLUTION | Freq: Once | RESPIRATORY_TRACT | Status: AC
  Administered 2018-12-06: 2.5 mg via RESPIRATORY_TRACT

## 2018-12-06 MED ORDER — ALBUTEROL SULFATE HFA 108 (90 BASE) MCG/ACT IN AERS: 2.0000 | INHALATION_SPRAY | RESPIRATORY_TRACT | 1 refills | Status: AC | PRN

## 2018-12-06 MED ORDER — NEEDLE (DISP) 23G X 3/4" MISC: 1.0000 [IU] | 0 refills | Status: DC | PRN

## 2018-12-06 MED ORDER — NEEDLE (DISP) 18G X 1-1/2" MISC: 1.0000 [IU] | 0 refills | Status: DC | PRN

## 2018-12-06 MED ORDER — TESTOSTERONE CYPIONATE 200 MG/ML IM SOLN: 120.0000 mg | INTRAMUSCULAR | 0 refills | Status: DC

## 2018-12-06 MED ORDER — IPRATROPIUM BROMIDE 0.02 % IN SOLN
0.5000 mg | Freq: Once | RESPIRATORY_TRACT | Status: AC
  Administered 2018-12-06: 0.5 mg via RESPIRATORY_TRACT

## 2018-12-06 MED ORDER — BUDESONIDE-FORMOTEROL FUMARATE 160-4.5 MCG/ACT IN AERO: 2.0000 | INHALATION_SPRAY | Freq: Two times a day (BID) | RESPIRATORY_TRACT | 2 refills | Status: AC

## 2018-12-06 NOTE — Progress Notes (Addendum)
Subjective:    Patient: Samantha Hunter  DOB: Jul 16, 2001; 17 y.o.   MRN: [id number]  Chief Complaint  Patient presents with  . hospital follow up    trouble breathing; was given medication     HPI I have met [name] 1 time prior 5 months ago when he establish care for gender affirming masculinizing hormone therapy.  At that time he had been out of testosterone for the past 2 and half months and was supposed to be taking 0.6 mL every 14 days .  Was initially started on therapy October 2018. Last T injection of 162m (=0.675m was 8d prior 12/19 so next dose due in 6d/  Since our last visit, [name] has gotten married (to female) and they are caring for a family members older preschool age child.  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  He was seen in the ER last month and given 2 nebulizer treatments and a steroid injection. Has been having to use the albuterol 3-4x/wk for the past couple mos - triggered worse in the cold wheather and will get tight and wheezing and can be heard from across the room. Has had a dry cough no nasal cong no sinus pain/pressure - dry.    Is going to change insurance to not as good of a plan next wk  At our last visit, [name] discussed how for a long time he has felt lightheaded and dizzy with any sudden position change from lying to sitting or sitting to standing.  Several weeks ago he actually passed out when he stood up suddenly.  Likely he did not harm himself and fell backwards with his back hitting the couch and slid off.  He does not know how long he was unconscious for -probably brief -but did have true LOC.  The lightheadedness and dizziness as well as occasional presyncope with position change symptoms continue to worsen.  Medical History Past Medical History:  Diagnosis Date  . Allergy   . Anemia   . Anxiety   . Asthma   .  Cannabis use disorder, mild, abuse 03/16/2017  . Depression   . Gender dysphoria 05/12/2017  . GERD (gastroesophageal reflux disease)   . Non-suicidal self harm 03/16/2017  . OCD (obsessive compulsive disorder) 03/16/2017  . Suicidal ideation 03/16/2017   Past Surgical History:  Procedure Laterality Date  . wisdom tooth removal Bilateral    Current Outpatient Medications on File Prior to Visit  Medication Sig Dispense Refill  . albuterol (PROVENTIL HFA;VENTOLIN HFA) 108 (90 Base) MCG/ACT inhaler Inhale 2 puffs into the lungs every 4 (four) hours as needed for wheezing or shortness of breath (cough, shortness of breath or wheezing.). 1 Inhaler 1  . NEEDLE, DISP, 18 G 18G X 1-1/2" MISC 1 Units by Does not apply route as needed. 100 each 0  . NEEDLE, DISP, 23 G 23G X 3/4" MISC 1 Units by Does not apply route as needed. 100 each 0  . Syringe, Disposable, 1 ML MISC 1 Units by Does not apply route as needed. 100 each 0  . testosterone cypionate (DEPOTESTOSTERONE CYPIONATE) 200 MG/ML injection INJECT 0.6 MLS (120 MG TOTAL) INTO THE MUSCLE EVERY 14 (FOURTEEN) DAYS. 10 mL 0   No current facility-administered medications on file prior to visit.    Allergies  Allergen Reactions  . Pineapple Hives and Itching  Family History  Problem Relation Age of Onset  . Mental illness Mother   . Alcohol abuse Mother   . Drug abuse Mother   . Depression Mother   . Hyperlipidemia Mother   . Mental illness Father   . Alcohol abuse Father   . Drug abuse Father   . Depression Father   . Cancer Father        skin  . Heart disease Father   . Hyperlipidemia Father   . Stroke Father   . Mental illness Brother   . Anxiety disorder Brother   . Depression Brother   . Anxiety disorder Sister   . Depression Sister   . Mental illness Sister   . Alcohol abuse Maternal Uncle   . Drug abuse Maternal Uncle   . Alcohol abuse Maternal Grandfather   . Mental illness Brother   . Mental illness Sister    Social History    Socioeconomic History  . Marital status: Single    Spouse name: Not on file  . Number of children: Not on file  . Years of education: Not on file  . Highest education level: Not on file  Occupational History  . Not on file  Social Needs  . Financial resource strain: Not on file  . Food insecurity:    Worry: Not on file    Inability: Not on file  . Transportation needs:    Medical: Not on file    Non-medical: Not on file  Tobacco Use  . Smoking status: Former Smoker    Packs/day: 0.25    Years: 1.00    Pack years: 0.25    Types: Cigarettes    Last attempt to quit: 02/08/2018    Years since quitting: 0.8  . Smokeless tobacco: Former Systems developer    Types: Chew  Substance and Sexual Activity  . Alcohol use: No  . Drug use: Yes    Frequency: 14.0 times per week    Types: Marijuana  . Sexual activity: Yes    Birth control/protection: Surgical, Other-see comments    Comment: spouse has had vasectomy, pt amenorrheic on testosterone  Lifestyle  . Physical activity:    Days per week: Not on file    Minutes per session: Not on file  . Stress: Not on file  Relationships  . Social connections:    Talks on phone: Not on file    Gets together: Not on file    Attends religious service: Not on file    Active member of club or organization: Not on file    Attends meetings of clubs or organizations: Not on file    Relationship status: Not on file  Other Topics Concern  . Not on file  Social History Narrative  . Not on file   Depression screen [facility] 2/9 08/27/2018 02/08/2018  Decreased Interest 0 0  Down, Depressed, Hopeless 0 0  PHQ - 2 Score 0 0  Some encounter information is confidential and restricted. Go to Review Flowsheets activity to see all data.    ROS As noted in HPI  Objective:  BP 120/77   Pulse 85   Temp 98.2 F (36.8 C) (Oral)   Resp 18   Ht 5' 5"  (1.651 m)   Wt 249 lb 6.4 oz (113.1 kg)   SpO2 99%   BMI 41.50 kg/m  Physical Exam Constitutional:       General: He is not in acute distress.    Appearance: He is well-developed. He is not  diaphoretic.  HENT:     Head: Normocephalic and atraumatic.     Right Ear: External ear normal.     Left Ear: External ear normal.  Eyes:     General: No scleral icterus.    Conjunctiva/sclera: Conjunctivae normal.  Neck:     Musculoskeletal: Normal range of motion and neck supple.     Thyroid: No thyromegaly.  Cardiovascular:     Rate and Rhythm: Normal rate and regular rhythm.     Heart sounds: Normal heart sounds.  Pulmonary:     Effort: Pulmonary effort is normal. No respiratory distress.     Breath sounds: Normal breath sounds.  Lymphadenopathy:     Cervical: No cervical adenopathy.  Skin:    General: Skin is warm and dry.     Findings: No erythema.  Neurological:     Mental Status: He is alert and oriented to person, place, and time.  Psychiatric:        Behavior: Behavior normal.      Orthostatic VS - did not induce sxs Lying BP 102/71 HR 81 Sitting BP 120/79 HR 82 Standing 118/71 HR 87  Peak flow 500 L/min at goal. Goal peak flow 470-568 L/min  POC TESTING Office Visit on 12/06/2018  Component Date Value Ref Range Status  . WBC 12/06/2018 5.0  4.6 - 10.2 K/uL Final  . Lymph, poc 12/06/2018 2.5  0.6 - 3.4 Final  . POC LYMPH PERCENT 12/06/2018 50.9* 10 - 50 %L Final  . MID (cbc) 12/06/2018 0.6  0 - 0.9 Final  . POC MID % 12/06/2018 11.7  0 - 12 %M Final  . POC Granulocyte 12/06/2018 1.9* 2 - 6.9 Final  . Granulocyte percent 12/06/2018 37.4  37 - 80 %G Final  . RBC 12/06/2018 5.15  4.04 - 5.48 M/uL Final  . Hemoglobin 12/06/2018 14.1  11 - 14.6 g/dL Final  . HCT, POC 12/06/2018 41.6* 29 - 41 % Final  . MCV 12/06/2018 80.7  76 - 111 fL Final  . MCH, POC 12/06/2018 27.4  27 - 31.2 pg Final  . MCHC 12/06/2018 34.0  31.8 - 35.4 g/dL Final  . RDW, POC 12/06/2018 18.1  % Final  . Platelet Count, POC 12/06/2018 358  142 - 424 K/uL Final  . MPV 12/06/2018 7.1  0 - 99.8 fL Final     EKG: NSR, no acute ischemic changes noted. No significant change noted when compared to prior EKG done 03/15/17.   I have personally reviewed the EKG tracing and agree with the computer interpretation. Sinus  Rhythm  WITHIN NORMAL LIMITS Assessment & Plan:   1. Iron deficiency anemia, unspecified iron deficiency anemia type   2. Long term (current) use of systemic steroids   3. Mild intermittent asthma without complication - diagnosed this year. Freq flairs and regular alb inhaler use in cold weather so start sched symbicort - esp in winter - ok to try to go off when environmental triggers resolve but restart if notice increasing alb use.  Pt endorses SIG improvement on nebulizer machine compared to HFA alb inhaler and as has had 2 recent ER visit for asthma I think these likely could have been avoided iif pt had home neb machine so rx sent to [facility] for this and nebulizer solution for albuterol sent to pharm.  4. Long-term current use of testosterone cypionate = WOULD LIKE TO TRY TO INCREASE TESTOSTERONE - AFTER LABS COME BACK, WILL INCREASE DOSE IF THERE IS SOME WIGGLE ROOM -  DRAWN TODAY - 8D AFTER 127m (=0.636m EVERY 14D DOSE.  5. Orthostatic syncope - pt had actual full syncopal episode upon standing recently but has been endorsing orthostatic hypotension sxs for years - refer to cardiology Dr. KlCaryl Comesor further eval to r/o POTS or similar cardiac cause - orthostatic VS today and last visit 5 mos ago appear nml.  If cardiology eval is unrevealing, next step will be neurology referral  6. Orthostatic lightheadedness     Patient will continue on current chronic medications other than changes noted above, so ok to refill when needed.   See after visit summary for patient specific instructions.  Orders Placed This Encounter  Procedures  . DME Nebulizer machine    Order Specific Question:   Patient needs a nebulizer to treat with the following condition    Answer:   Asthma [[id number]].  Comprehensive metabolic panel  . TestT+TestF+SHBG  . Estradiol  . Ferritin  . Ambulatory referral to Cardiology    Referral Priority:   Routine    Referral Type:   Consultation    Referral Reason:   Specialty Services Required    Referred to Provider:   KlDeboraha SprangMD    Requested Specialty:   Cardiology    Number of Visits Requested:   1  . Care order/instruction:    Scheduling Instructions:     Peak Flow (IF NEB IS ORDERED PLEASE DO BEFORE AND AFTER NEB)  . Orthostatic vital signs  . POCT CBC  . EKG 12-Lead    Meds ordered this encounter  Medications  . albuterol (PROVENTIL) (2.5 MG/3ML) 0.083% nebulizer solution 2.5 mg  . ipratropium (ATROVENT) nebulizer solution 0.5 mg  . budesonide-formoterol (SYMBICORT) 160-4.5 MCG/ACT inhaler    Sig: Inhale 2 puffs into the lungs 2 (two) times daily.    Dispense:  3 Inhaler    Refill:  2    Please dispense 3 inhalers for a 3 month supply at once.  . Marland Kitchenlbuterol (PROVENTIL HFA;VENTOLIN HFA) 108 (90 Base) MCG/ACT inhaler    Sig: Inhale 2 puffs into the lungs every 4 (four) hours as needed for wheezing or shortness of breath (cough, shortness of breath or wheezing.).    Dispense:  3 Inhaler    Refill:  1    Please dispense this as a 3 mo supply at once if possible.  . Marland KitchenEEDLE, DISP, 18 G 18G X 1-1/2" MISC    Sig: 1 Units by Does not apply route as needed.    Dispense:  100 each    Refill:  0  . NEEDLE, DISP, 23 G 23G X 3/4" MISC    Sig: 1 Units by Does not apply route as needed.    Dispense:  100 each    Refill:  0  . testosterone cypionate (DEPOTESTOSTERONE CYPIONATE) 200 MG/ML injection    Sig: Inject 0.6 mLs (120 mg total) into the muscle every 14 (fourteen) days.    Dispense:  10 mL    Refill:  0    DX Code F64.0, F64.9, Z79.890  . albuterol (PROVENTIL) (2.5 MG/3ML) 0.083% nebulizer solution    Sig: Take 3 mLs (2.5 mg total) by nebulization every 6 (six) hours as needed for wheezing or shortness of breath.    Dispense:  150  mL    Refill:  1   Over 40 min spent in face-to-face evaluation of and consultation with patient and coordination of care.  Over 50% of this time was spent counseling this patient  regarding ABOVE.  Patient verbalized to me that they understand the following: diagnosis, what is being done for them, what to expect and what should be done at home.  Their questions have been answered. They understand that I am unable to predict every possible medication interaction or adverse outcome and that if any unexpected symptoms arise, they should contact us and their pharmacist, as well as never hesitate to seek urgent/emergent care at [facility] or ER if they think it might be warranted.    Delman Cheadle, MD, MPH [facility] [address] [place], [place]  [address] [phone] Office phone  [phone] Office fax  12/06/18 2:13 PM

## 2018-12-06 NOTE — Patient Instructions (Addendum)
If you have lab work done today you will be contacted with your lab results within the next 2 weeks.  If you have not heard from [place] then please contact us. The fastest way to get your results is to register for My Chart.   IF you received an x-ray today, you will receive an invoice from [facility]. Please contact [facility] at [phone] with questions or concerns regarding your invoice.   IF you received labwork today, you will receive an invoice from [place]. Please contact [facility] at [phone] with questions or concerns regarding your invoice.   Our billing staff will not be able to assist you with questions regarding bills from these companies.  You will be contacted with the lab results as soon as they are available. The fastest way to get your results is to activate your My Chart account. Instructions are located on the last page of this paperwork. If you have not heard from [place] regarding the results in 2 weeks, please contact this office.      Orthostatic Hypotension Blood pressure is a measurement of how strongly, or weakly, your blood is pressing against the walls of your arteries. Orthostatic hypotension is a sudden drop in blood pressure that happens when you quickly change positions, such as when you get up from sitting or lying down. Arteries are blood vessels that carry blood from your heart throughout your body. When blood pressure is too low, you may not get enough blood to your brain or to the rest of your organs. This can cause weakness, light-headedness, rapid heartbeat, and fainting. This can last for just a few seconds or for up to a few minutes. Orthostatic hypotension is usually not a serious problem. However, if it happens frequently or gets worse, it may be a sign of something more serious. What are the causes? This condition may be caused by:  Sudden changes in posture, such as standing up quickly after you have been sitting or lying  down.  Blood loss.  Loss of body fluids (dehydration).  Heart problems.  Hormone (endocrine) problems.  Pregnancy.  Severe infection.  Lack of certain nutrients.  Severe allergic reactions (anaphylaxis).  Certain medicines, such as blood pressure medicine or medicines that make the body lose excess fluids (diuretics). Sometimes, this condition can be caused by not taking medicine as directed, such as taking too much of a certain medicine. What increases the risk? The following factors may make you more likely to develop this condition:  Age. Risk increases as you get older.  Conditions that affect the heart or the central nervous system.  Taking certain medicines, such as blood pressure medicine or diuretics.  Being pregnant. What are the signs or symptoms? Symptoms of this condition may include:  Weakness.  Light-headedness.  Dizziness.  Blurred vision.  Fatigue.  Rapid heartbeat.  Fainting, in severe cases. How is this diagnosed? This condition is diagnosed based on:  Your medical history.  Your symptoms.  Your blood pressure measurement. Your health care provider will check your blood pressure when you are: ? Lying down. ? Sitting. ? Standing. A blood pressure reading is recorded as two numbers, such as "120 over 80" (or 120/80). The first ("top") number is called the systolic pressure. It is a measure of the pressure in your arteries as your heart beats. The second ("bottom") number is called the diastolic pressure. It is a measure of the pressure in your arteries when your heart relaxes between beats.  Blood pressure is measured in a unit called mm Hg. Healthy blood pressure for most adults is 120/80. If your blood pressure is below 90/60, you may be diagnosed with hypotension. Other information or tests that may be used to diagnose orthostatic hypotension include:  Your other vital signs, such as your heart rate and temperature.  Blood tests.  Tilt  table test. For this test, you will be safely secured to a table that moves you from a lying position to an upright position. Your heart rhythm and blood pressure will be monitored during the test. How is this treated? This condition may be treated by:  Changing your diet. This may involve eating more salt (sodium) or drinking more water.  Taking medicines to raise your blood pressure.  Changing the dosage of certain medicines you are taking that might be lowering your blood pressure.  Wearing compression stockings. These stockings help to prevent blood clots and reduce swelling in your legs. In some cases, you may need to go to the hospital for:  Fluid replacement. This means you will receive fluids through an IV.  Blood replacement. This means you will receive donated blood through an IV (transfusion).  Treating an infection or heart problems, if this applies.  Monitoring. You may need to be monitored while medicines that you are taking wear off. Follow these instructions at home: Eating and drinking   Drink enough fluid to keep your urine pale yellow.  Eat a healthy diet, and follow instructions from your health care provider about eating or drinking restrictions. A healthy diet includes: ? Fresh fruits and vegetables. ? Whole grains. ? Lean meats. ? Low-fat dairy products.  Eat extra salt only as directed. Do not add extra salt to your diet unless your health care provider told you to do that.  Eat frequent, small meals.  Avoid standing up suddenly after eating. Medicines  Take over-the-counter and prescription medicines only as told by your health care provider. ? Follow instructions from your health care provider about changing the dosage of your current medicines, if this applies. ? Do not stop or adjust any of your medicines on your own. General instructions   Wear compression stockings as told by your health care provider.  Get up slowly from lying down or  sitting positions. This gives your blood pressure a chance to adjust.  Avoid hot showers and excessive heat as directed by your health care provider.  Return to your normal activities as told by your health care provider. Ask your health care provider what activities are safe for you.  Do not use any products that contain nicotine or tobacco, such as cigarettes, e-cigarettes, and chewing tobacco. If you need help quitting, ask your health care provider.  Keep all follow-up visits as told by your health care provider. This is important. Contact a health care provider if you:  Vomit.  Have diarrhea.  Have a fever for more than 2-3 days.  Feel more thirsty than usual.  Feel weak and tired. Get help right away if you:  Have chest pain.  Have a fast or irregular heartbeat.  Develop numbness in any part of your body.  Cannot move your arms or your legs.  Have trouble speaking.  Become sweaty or feel light-headed.  Faint.  Feel short of breath.  Have trouble staying awake.  Feel confused. Summary  Orthostatic hypotension is a sudden drop in blood pressure that happens when you quickly change positions.  Orthostatic hypotension is usually not a  serious problem.  It is diagnosed by having your blood pressure taken lying down, sitting, and then standing.  It may be treated by changing your diet or adjusting your medicines. This information is not intended to replace advice given to you by your health care provider. Make sure you discuss any questions you have with your health care provider. Document Released: 11/17/2002 Document Revised: 05/23/2018 Document Reviewed: 05/23/2018 Elsevier Interactive Patient Education  2019 [employer]  Asthma Attack Prevention, Adult Although you may not be able to control the fact that you have asthma, you can take actions to prevent episodes of asthma (asthma attacks). These actions include:  Creating a written plan for managing and  treating your asthma attacks (asthma action plan).  Monitoring your asthma.  Avoiding things that can irritate your airways or make your asthma symptoms worse (asthma triggers).  Taking your medicines as directed.  Acting quickly if you have signs or symptoms of an asthma attack. What are some ways to prevent an asthma attack? Create a plan Work with your health care provider to create an asthma action plan. This plan should include:  A list of your asthma triggers and how to avoid them.  A list of symptoms that you experience during an asthma attack.  Information about when to take medicine and how much medicine to take.  Information to help you understand your peak flow measurements.  Contact information for your health care providers.  Daily actions that you can take to control asthma. Monitor your asthma To monitor your asthma:  Use your peak flow meter every morning and every evening for 2-3 weeks. Record the results in a journal. A drop in your peak flow numbers on one or more days may mean that you are starting to have an asthma attack, even if you are not having symptoms.  When you have asthma symptoms, write them down in a journal.  Avoid asthma triggers Work with your health care provider to find out what your asthma triggers are. This can be done by:  Being tested for allergies.  Keeping a journal that notes when asthma attacks occur and what may have contributed to them.  Asking your health care provider whether other medical conditions make your asthma worse. Common asthma triggers include:  Dust.  Smoke. This includes campfire smoke and secondhand smoke from tobacco products.  Pet dander.  Trees, grasses or pollens.  Very cold, dry, or humid air.  Mold.  Foods that contain high amounts of sulfites.  Strong smells.  Engine exhaust and air pollution.  Aerosol sprays and fumes from household cleaners.  Household pests and their droppings,  including dust mites and cockroaches.  Certain medicines, including NSAIDs. Once you have determined your asthma triggers, take steps to avoid them. Depending on your triggers, you may be able to reduce the chance of an asthma attack by:  Keeping your home clean. Have someone dust and vacuum your home for you 1 or 2 times a week. If possible, have them use a high-efficiency particulate arrestance (HEPA) vacuum.  Washing your sheets weekly in hot water.  Using allergy-proof mattress covers and casings on your bed.  Keeping pets out of your home.  Taking care of mold and water problems in your home.  Avoiding areas where people smoke.  Avoiding using strong perfumes or odor sprays.  Avoid spending a lot of time outdoors when pollen counts are high and on very windy days.  Talking with your health care provider before stopping  or starting any new medicines. Medicines Take over-the-counter and prescription medicines only as told by your health care provider. Many asthma attacks can be prevented by carefully following your medicine schedule. Taking your medicines correctly is especially important when you cannot avoid certain asthma triggers. Even if you are doing well, do not stop taking your medicine and do not take less medicine. Act quickly If an asthma attack happens, acting quickly can decrease how severe it is and how long it lasts. Take these actions:  Pay attention to your symptoms. If you are coughing, wheezing, or having difficulty breathing, do not wait to see if your symptoms go away on their own. Follow your asthma action plan.  If you have followed your asthma action plan and your symptoms are not improving, call your health care provider or seek immediate medical care at the nearest hospital. It is important to write down how often you need to use your fast-acting rescue inhaler. You can track how often you use an inhaler in your journal. If you are using your rescue inhaler  more often, it may mean that your asthma is not under control. Adjusting your asthma treatment plan may help you to prevent future asthma attacks and help you to gain better control of your condition. How can I prevent an asthma attack when I exercise? Exercise is a common asthma trigger. To prevent asthma attacks during exercise:  Follow advice from your health care provider about whether you should use your fast-acting inhaler before exercising. Many people with asthma experience exercise-induced bronchoconstriction (EIB). This condition often worsens during vigorous exercise in cold, humid, or dry environments. Usually, people with EIB can stay very active by using a fast-acting inhaler before exercising.  Avoid exercising outdoors in very cold or humid weather.  Avoid exercising outdoors when pollen counts are high.  Warm up and cool down when exercising.  Stop exercising right away if asthma symptoms start. Consider taking part in exercises that are less likely to cause asthma symptoms such as:  Indoor swimming.  Biking.  Walking.  Hiking.  Playing football. This information is not intended to replace advice given to you by your health care provider. Make sure you discuss any questions you have with your health care provider. Document Released: 11/15/2009 Document Revised: 07/28/2016 Document Reviewed: 05/13/2016 Elsevier Interactive Patient Education  2019 [employer]  Asthma, Adult  Asthma is a long-term (chronic) condition that causes recurrent episodes in which the airways become tight and narrow. The airways are the passages that lead from the nose and mouth down into the lungs. Asthma episodes, also called asthma attacks, can cause coughing, wheezing, shortness of breath, and chest pain. The airways can also fill with mucus. During an attack, it can be difficult to breathe. Asthma attacks can range from minor to life threatening. Asthma cannot be cured, but medicines and  lifestyle changes can help control it and treat acute attacks. What are the causes? This condition is believed to be caused by inherited (genetic) and environmental factors, but its exact cause is not known. There are many things that can bring on an asthma attack or make asthma symptoms worse (triggers). Asthma triggers are different for each person. Common triggers include:  Mold.  Dust.  Cigarette smoke.  Cockroaches.  Things that can cause allergy symptoms (allergens), such as animal dander or pollen from trees or grass.  Air pollutants such as household cleaners, wood smoke, smog, or Therapist, occupational.  Cold air, weather changes, and winds (which  increase molds and pollen in the air).  Strong emotional expressions such as crying or laughing hard.  Stress.  Certain medicines (such as aspirin) or types of medicines (such as beta-blockers).  Sulfites in foods and drinks. Foods and drinks that may contain sulfites include dried fruit, potato chips, and sparkling grape juice.  Infections or inflammatory conditions such as the flu, a cold, or inflammation of the nasal membranes (rhinitis).  Gastroesophageal reflux disease (GERD).  Exercise or strenuous activity. What are the signs or symptoms? Symptoms of this condition may occur right after asthma is triggered or many hours later. Symptoms include:  Wheezing. This can sound like whistling when you breathe.  Excessive nighttime or early morning coughing.  Frequent or severe coughing with a common cold.  Chest tightness.  Shortness of breath.  Tiredness (fatigue) with minimal activity. How is this diagnosed? This condition is diagnosed based on:  Your medical history.  A physical exam.  Tests, which may include: ? Lung function studies and pulmonary studies (spirometry). These tests can evaluate the flow of air in your lungs. ? Allergy tests. ? Imaging tests, such as X-rays. How is this treated? There is no cure  for this condition, but treatment can help control your symptoms. Treatment for asthma usually involves:  Identifying and avoiding your asthma triggers.  Using medicines to control your symptoms. Generally, two types of medicines are used to treat asthma: ? Controller medicines. These help prevent asthma symptoms from occurring. They are usually taken every day. ? Fast-acting reliever or rescue medicines. These quickly relieve asthma symptoms by widening the narrow and tight airways. They are used as needed and provide short-term relief.  Using supplemental oxygen. This may be needed during a severe episode.  Using other medicines, such as: ? Allergy medicines, such as antihistamines, if your asthma attacks are triggered by allergens. ? Immune medicines (immunomodulators). These are medicines that help control the immune system.  Creating an asthma action plan. An asthma action plan is a written plan for managing and treating your asthma attacks. This plan includes: ? A list of your asthma triggers and how to avoid them. ? Information about when medicines should be taken and when their dosage should be changed. ? Instructions about using a device called a peak flow meter. A peak flow meter measures how well the lungs are working and the severity of your asthma. It helps you monitor your condition. Follow these instructions at home: Controlling your home environment Control your home environment in the following ways to help avoid triggers and prevent asthma attacks:  Change your heating and air conditioning filter regularly.  Limit your use of fireplaces and wood stoves.  Get rid of pests (such as roaches and mice) and their droppings.  Throw away plants if you see mold on them.  Clean floors and dust surfaces regularly. Use unscented cleaning products.  Try to have someone else vacuum for you regularly. Stay out of rooms while they are being vacuumed and for a short while afterward.  If you vacuum, use a dust mask from a hardware store, a double-layered or microfilter vacuum cleaner bag, or a vacuum cleaner with a HEPA filter.  Replace carpet with wood, tile, or vinyl flooring. Carpet can trap dander and dust.  Use allergy-proof pillows, mattress covers, and box spring covers.  Keep your bedroom a trigger-free room.  Avoid pets and keep windows closed when allergens are in the air.  Wash beddings every week in hot water and dry  them in a dryer.  Use blankets that are made of polyester or cotton.  Clean bathrooms and kitchens with bleach. If possible, have someone repaint the walls in these rooms with mold-resistant paint. Stay out of the rooms that are being cleaned and painted.  Wash your hands often with soap and water. If soap and water are not available, use hand sanitizer.  Do not allow anyone to smoke in your home. General instructions  Take over-the-counter and prescription medicines only as told by your health care provider. ? Speak with your health care provider if you have questions about how or when to take the medicines. ? Make note if you are requiring more frequent dosages.  Do not use any products that contain nicotine or tobacco, such as cigarettes and e-cigarettes. If you need help quitting, ask your health care provider. Also, avoid being exposed to secondhand smoke.  Use a peak flow meter as told by your health care provider. Record and keep track of the readings.  Understand and use the asthma action plan to help minimize, or stop an asthma attack, without needing to seek medical care.  Make sure you stay up to date on your yearly vaccinations as told by your health care provider. This may include vaccines for the flu and pneumonia.  Avoid outdoor activities when allergen counts are high and when air quality is low.  Wear a ski mask that covers your nose and mouth during outdoor winter activities. Exercise indoors on cold days if you  can.  Warm up before exercising, and take time for a cool-down period after exercise.  Keep all follow-up visits as told by your health care provider. This is important. Where to find more information  For information about asthma, turn to the Centers for Disease Control and Prevention at http://www.mills-berg.com/.htm  For air quality information, turn to [employer] at [employer] Contact a health care provider if:  You have wheezing, shortness of breath, or a cough even while you are taking medicine to prevent attacks.  The mucus you cough up (sputum) is thicker than usual.  Your sputum changes from clear or white to yellow, green, gray, or bloody.  Your medicines are causing side effects, such as a rash, itching, swelling, or trouble breathing.  You need to use a reliever medicine more than 2-3 times a week.  Your peak flow reading is still at 50-79% of your personal best after following your action plan for 1 hour.  You have a fever. Get help right away if:  You are getting worse and do not respond to treatment during an asthma attack.  You are short of breath when at rest or when doing very little physical activity.  You have difficulty eating, drinking, or talking.  You have chest pain or tightness.  You develop a fast heartbeat or palpitations.  You have a bluish color to your lips or fingernails.  You are light-headed or dizzy, or you faint.  Your peak flow reading is less than 50% of your personal best.  You feel too tired to breathe normally. Summary  Asthma is a long-term (chronic) condition that causes recurrent episodes in which the airways become tight and narrow. These episodes can cause coughing, wheezing, shortness of breath, and chest pain.  Asthma cannot be cured, but medicines and lifestyle changes can help control it and treat acute attacks.  Make sure you understand how to avoid triggers and how and when to use your medicines.  Asthma attacks  can range  from minor to life threatening. Get help right away if you have an asthma attack and do not respond to treatment with your usual rescue medicines. This information is not intended to replace advice given to you by your health care provider. Make sure you discuss any questions you have with your health care provider. Document Released: 11/27/2005 Document Revised: 01/01/2017 Document Reviewed: 01/01/2017 Elsevier Interactive Patient Education  2019 [employer].

## 2018-12-08 MED ORDER — TESTOSTERONE CYPIONATE 200 MG/ML IM SOLN: 140.0000 mg | INTRAMUSCULAR | 0 refills | Status: DC

## 2018-12-09 ENCOUNTER — Encounter: Payer: Self-pay | Admitting: Family Medicine

## 2018-12-09 LAB — TESTT+TESTF+SHBG
SEX HORMONE BINDING: 22.9 nmol/L — AB (ref 24.6–122.0)
Testosterone, Free: 8.1 pg/mL
Testosterone, Total, LC/MS: 215.7 ng/dL — ABNORMAL HIGH (ref 10.0–55.0)

## 2018-12-09 LAB — COMPREHENSIVE METABOLIC PANEL
A/G RATIO: 1.7 (ref 1.2–2.2)
ALBUMIN: 4 g/dL (ref 3.5–5.5)
ALT: 19 IU/L (ref 0–32)
AST: 21 IU/L (ref 0–40)
Alkaline Phosphatase: 52 IU/L (ref 39–117)
BUN/Creatinine Ratio: 9 (ref 9–23)
BUN: 8 mg/dL (ref 6–20)
Bilirubin Total: <0.2 mg/dL (ref 0.0–1.2)
CO2: 22 mmol/L (ref 20–29)
Calcium: 9.4 mg/dL (ref 8.7–10.2)
Chloride: 104 mmol/L (ref 96–106)
Creatinine, Ser: 0.9 mg/dL (ref 0.57–1.00)
GFR, EST AFRICAN AMERICAN: 107 mL/min/{1.73_m2} (ref >59)
GFR, EST NON AFRICAN AMERICAN: 93 mL/min/{1.73_m2} (ref >59)
GLOBULIN, TOTAL: 2.3 g/dL (ref 1.5–4.5)
Glucose: 102 mg/dL — ABNORMAL HIGH (ref 65–99)
POTASSIUM: 4.7 mmol/L (ref 3.5–5.2)
Sodium: 144 mmol/L (ref 134–144)
TOTAL PROTEIN: 6.3 g/dL (ref 6.0–8.5)

## 2018-12-09 LAB — ESTRADIOL: Estradiol: 16.9 pg/mL

## 2018-12-09 LAB — FERRITIN: Ferritin: 14 ng/mL — ABNORMAL LOW (ref 15–77)

## 2019-01-01 ENCOUNTER — Ambulatory Visit (INDEPENDENT_AMBULATORY_CARE_PROVIDER_SITE_OTHER): Payer: PRIVATE HEALTH INSURANCE | Admitting: Emergency Medicine

## 2019-01-01 ENCOUNTER — Encounter: Payer: Self-pay | Admitting: Emergency Medicine

## 2019-01-01 ENCOUNTER — Other Ambulatory Visit: Payer: Self-pay

## 2019-01-01 VITALS — BP 111/79 | HR 79 | Temp 98.5°F | Resp 16 | Ht 65.0 in | Wt 244.8 lb

## 2019-01-01 DIAGNOSIS — J029 Acute pharyngitis, unspecified: Secondary | ICD-10-CM | POA: Insufficient documentation

## 2019-01-01 DIAGNOSIS — B349 Viral infection, unspecified: Secondary | ICD-10-CM

## 2019-01-01 LAB — POCT RAPID STREP A (OFFICE): RAPID STREP A SCREEN: NEGATIVE

## 2019-01-01 LAB — POCT CBC
Granulocyte percent: 62.8 %G (ref 37–80)
HCT, POC: 41.3 % — AB (ref 29–41)
Hemoglobin: 14.5 g/dL (ref 11–14.6)
Lymph, poc: 2.5 (ref 0.6–3.4)
MCH: 29.3 pg (ref 27–31.2)
MCHC: 35 g/dL (ref 31.8–35.4)
MCV: 83.6 fL (ref 76–111)
MID (CBC): 0.2 (ref 0–0.9)
MPV: 7.1 fL (ref 0–99.8)
PLATELET COUNT, POC: 320 10*3/uL (ref 142–424)
POC Granulocyte: 4.5 (ref 2–6.9)
POC LYMPH PERCENT: 34.4 %L (ref 10–50)
POC MID %: 2.8 %M (ref 0–12)
RBC: 4.94 M/uL (ref 4.04–5.48)
RDW, POC: 16.9 %
WBC: 7.2 10*3/uL (ref 4.6–10.2)

## 2019-01-01 NOTE — Patient Instructions (Addendum)
   If you have lab work done today you will be contacted with your lab results within the next 2 weeks.  If you have not heard from us then please contact us. The fastest way to get your results is to register for My Chart.   IF you received an x-ray today, you will receive an invoice from [facility]. Please contact [facility] at [phone] with questions or concerns regarding your invoice.   IF you received labwork today, you will receive an invoice from [facility]. Please contact [facility] at [phone] with questions or concerns regarding your invoice.   Our billing staff will not be able to assist you with questions regarding bills from these companies.  You will be contacted with the lab results as soon as they are available. The fastest way to get your results is to activate your My Chart account. Instructions are located on the last page of this paperwork. If you have not heard from us regarding the results in 2 weeks, please contact this office.     Sore Throat When you have a sore throat, your throat may feel:  Tender.  Burning.  Irritated.  Scratchy.  Painful when you swallow.  Painful when you talk. Many things can cause a sore throat, such as:  An infection.  Allergies.  Dry air.  Smoke or pollution.  Radiation treatment.  Gastroesophageal reflux disease (GERD).  A tumor. A sore throat can be the first sign of another sickness. It can happen with other problems, like:  Coughing.  Sneezing.  Fever.  Swelling in the neck. Most sore throats go away without treatment. Follow these instructions at home:      Take over-the-counter medicines only as told by your doctor. ? If your child has a sore throat, do not give your child aspirin.  Drink enough fluids to keep your pee (urine) pale yellow.  Rest when you feel you need to.  To help with pain: ? Sip warm liquids, such as broth, herbal tea, or warm water. ? Eat or drink  cold or frozen liquids, such as frozen ice pops. ? Gargle with a salt-water mixture 3-4 times a day or as needed. To make a salt-water mixture, add -1 tsp (3-6 g) of salt to 1 cup (237 mL) of warm water. Mix it until you cannot see the salt anymore. ? Suck on hard candy or throat lozenges. ? Put a cool-mist humidifier in your bedroom at night. ? Sit in the bathroom with the door closed for 5-10 minutes while you run hot water in the shower.  Do not use any products that contain nicotine or tobacco, such as cigarettes, e-cigarettes, and chewing tobacco. If you need help quitting, ask your doctor.  Wash your hands well and often with soap and water. If soap and water are not available, use hand sanitizer. Contact a doctor if:  You have a fever for more than 2-3 days.  You keep having symptoms for more than 2-3 days.  Your throat does not get better in 7 days.  You have a fever and your symptoms suddenly get worse.  Your child who is 3 months to 3 years old has a temperature of 102.2F (39C) or higher. Get help right away if:  You have trouble breathing.  You cannot swallow fluids, soft foods, or your saliva.  You have swelling in your throat or neck that gets worse.  You keep feeling sick to your stomach (nauseous).  You keep throwing up (  vomiting). Summary  A sore throat is pain, burning, irritation, or scratchiness in the throat. Many things can cause a sore throat.  Take over-the-counter medicines only as told by your doctor. Do not give your child aspirin.  Drink plenty of fluids, and rest as needed.  Contact a doctor if your symptoms get worse or your sore throat does not get better within 7 days. This information is not intended to replace advice given to you by your health care provider. Make sure you discuss any questions you have with your health care provider. Document Released: 09/05/2008 Document Revised: 04/29/2018 Document Reviewed: 04/29/2018 Elsevier  Interactive Patient Education  2019 Elsevier Inc.  

## 2019-01-01 NOTE — Progress Notes (Signed)
Samantha Hunter 18 y.o.   Chief Complaint  Patient presents with  . Sore Throat    per patient started yesterday  . Abdominal Pain    with nausea, no vomiting    HISTORY OF PRESENT ILLNESS: This is a 18 y.o. adult complaining of sore throat since yesterday.  Also has some right upper quadrant pain with nausea, no vomiting.  HPI   Prior to Admission medications   Medication Sig Start Date End Date Taking? Authorizing Provider  albuterol (PROVENTIL HFA;VENTOLIN HFA) 108 (90 Base) MCG/ACT inhaler Inhale 2 puffs into the lungs every 4 (four) hours as needed for wheezing or shortness of breath (cough, shortness of breath or wheezing.). 12/06/18  Yes Sherren Mocha, MD  albuterol (PROVENTIL) (2.5 MG/3ML) 0.083% nebulizer solution Take 3 mLs (2.5 mg total) by nebulization every 6 (six) hours as needed for wheezing or shortness of breath. 12/06/18  Yes Sherren Mocha, MD  budesonide-formoterol [facility]) 160-4.5 MCG/ACT inhaler Inhale 2 puffs into the lungs 2 (two) times daily. 12/06/18  Yes Sherren Mocha, MD  IBUPROFEN PO Take by mouth as needed.   Yes [provider]  testosterone cypionate (DEPOTESTOSTERONE CYPIONATE) 200 MG/ML injection Inject 0.7 mLs (140 mg total) into the muscle every 14 (fourteen) days. 12/08/18  Yes Sherren Mocha, MD  NEEDLE, DISP, 18 G 18G X 1-1/2" MISC 1 Units by Does not apply route as needed. 12/06/18   Sherren Mocha, MD  NEEDLE, DISP, 23 G 23G X 3/4" MISC 1 Units by Does not apply route as needed. 12/06/18   Sherren Mocha, MD  Syringe, Disposable, 1 ML MISC 1 Units by Does not apply route as needed. 07/01/18   Sherren Mocha, MD    Allergies  Allergen Reactions  . Pineapple Hives and Itching    Patient Active Problem List   Diagnosis Date Noted  . Odynophagia 03/23/2018  . Depression 03/23/2018  . Anxiety 03/23/2018  . Asthma 03/23/2018  . GERD (gastroesophageal reflux disease) 03/23/2018  . Gender identity: Female identifies as female 03/23/2018  . Narrowing  of airway   . 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 Long-term current use of testosterone cypionate 02/08/2018  . MDD (major depressive disorder), recurrent episode (HCC) 06/01/2017  . Gender dysphoria 05/12/2017  . 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 Cannabis use disorder, mild, abuse 03/16/2017  . OCD (obsessive compulsive disorder) 03/16/2017  . Suicidal ideation 03/16/2017  . Non-suicidal self harm 03/16/2017  . MDD (major depressive disorder), recurrent severe, without psychosis (HCC) 03/15/2017    Past Medical History:  Diagnosis Date  . Allergy   . Anemia   . Anxiety   . Asthma   . Cannabis use disorder, mild, abuse 03/16/2017  . Depression   . Gender dysphoria 05/12/2017  . GERD (gastroesophageal reflux disease)   . Non-suicidal self harm 03/16/2017  . OCD (obsessive compulsive disorder) 03/16/2017  . Suicidal ideation 03/16/2017    Past Surgical History:  Procedure Laterality Date  . wisdom tooth removal Bilateral     Social History   Socioeconomic History  . Marital status: Single    Spouse name: Not on file  . Number of children: Not on file  . Years of education: Not on file  . Highest education level: Not on file  Occupational History  . Not on file  Social Needs  . Financial resource strain: Not on file  . Food insecurity:    Worry: Not on  file    Inability: Not on file  . Transportation needs:    Medical: Not on file    Non-medical: Not on file  Tobacco Use  . Smoking status: Former Smoker    Packs/day: 0.25    Years: 1.00    Pack years: 0.25    Types: Cigarettes    Last attempt to quit: 02/08/2018    Years since quitting: 0.8  . Smokeless tobacco: Former Neurosurgeon    Types: Chew  Substance and Sexual Activity  . Alcohol use: No  . Drug use: Yes    Frequency: 14.0 times per week    Types: Marijuana  . Sexual activity: Yes    Birth control/protection: Surgical, Other-see comments     Comment: spouse has had vasectomy, pt amenorrheic on testosterone  Lifestyle  . Physical activity:    Days per week: Not on file    Minutes per session: Not on file  . Stress: Not on file  Relationships  . Social connections:    Talks on phone: Not on file    Gets together: Not on file    Attends religious service: Not on file    Active member of club or organization: Not on file    Attends meetings of clubs or organizations: Not on file    Relationship status: Not on file  . Intimate partner violence:    Fear of current or ex partner: Not on file    Emotionally abused: Not on file    Physically abused: Not on file    Forced sexual activity: Not on file  Other Topics Concern  . Not on file  Social History Narrative  . Not on file    Family History  Problem Relation Age of Onset  . Mental illness Mother   . Alcohol abuse Mother   . Drug abuse Mother   . Depression Mother   . Hyperlipidemia Mother   . Mental illness Father   . Alcohol abuse Father   . Drug abuse Father   . Depression Father   . Cancer Father        skin  . Heart disease Father   . Hyperlipidemia Father   . Stroke Father   . Mental illness Brother   . Anxiety disorder Brother   . Depression Brother   . Anxiety disorder Sister   . Depression Sister   . Mental illness Sister   . Alcohol abuse Maternal Uncle   . Drug abuse Maternal Uncle   . Alcohol abuse Maternal Grandfather   . Mental illness Brother   . Mental illness Sister      Review of Systems  Constitutional: Positive for malaise/fatigue. Negative for chills and fever.  HENT: Positive for sore throat. Negative for ear discharge and ear pain.   Eyes: Negative.  Negative for discharge and redness.  Respiratory: Negative.  Negative for cough, shortness of breath and wheezing.   Cardiovascular: Negative.  Negative for chest pain and palpitations.  Gastrointestinal: Positive for abdominal pain and nausea. Negative for vomiting.    Genitourinary: Negative.  Negative for dysuria.  Musculoskeletal: Negative.  Negative for back pain, myalgias and neck pain.  Neurological: Negative.  Negative for dizziness and headaches.  Endo/Heme/Allergies: Negative.   All other systems reviewed and are negative.   Vitals:   01/01/19 1509  BP: 111/79  Pulse: 79  Resp: 16  Temp: 98.5 F (36.9 C)  SpO2: 98%    Physical Exam Vitals signs reviewed.  Constitutional:  Appearance: She is well-developed.  HENT:     Head: Normocephalic and atraumatic.     Nose: Nose normal.     Mouth/Throat:     Mouth: Mucous membranes are dry.     Pharynx: Oropharynx is clear.  Eyes:     Extraocular Movements: Extraocular movements intact.     Conjunctiva/sclera: Conjunctivae normal.     Pupils: Pupils are equal, round, and reactive to light.  Neck:     Musculoskeletal: Normal range of motion and neck supple.  Cardiovascular:     Rate and Rhythm: Normal rate and regular rhythm.     Heart sounds: Normal heart sounds.  Pulmonary:     Effort: Pulmonary effort is normal.     Breath sounds: Normal breath sounds.  Abdominal:     General: Abdomen is flat. Bowel sounds are normal. There is no distension.     Palpations: Abdomen is soft.     Tenderness: There is abdominal tenderness in the right upper quadrant.  Musculoskeletal: Normal range of motion.  Skin:    General: Skin is warm and dry.     Capillary Refill: Capillary refill takes less than 2 seconds.  Neurological:     General: No focal deficit present.     Mental Status: She is alert and oriented to person, place, and time.  Psychiatric:        Mood and Affect: Mood normal.        Behavior: Behavior normal.     Results for orders placed or performed in visit on 01/01/19 (from the past 24 hour(s))  POCT rapid strep A     Status: None   Collection Time: 01/01/19  3:29 PM  Result Value Ref Range   Rapid Strep A Screen Negative Negative  POCT CBC     Status: Abnormal    Collection Time: 01/01/19  3:40 PM  Result Value Ref Range   WBC 7.2 4.6 - 10.2 K/uL   Lymph, poc 2.5 0.6 - 3.4   POC LYMPH PERCENT 34.4 10 - 50 %L   MID (cbc) 0.2 0 - 0.9   POC MID % 2.8 0 - 12 %M   POC Granulocyte 4.5 2 - 6.9   Granulocyte percent 62.8 37 - 80 %G   RBC 4.94 4.04 - 5.48 M/uL   Hemoglobin 14.5 11 - 14.6 g/dL   HCT, POC 16.2 (A) 29 - 41 %   MCV 83.6 76 - 111 fL   MCH, POC 29.3 27 - 31.2 pg   MCHC 35.0 31.8 - 35.4 g/dL   RDW, POC 44.6 %   Platelet Count, POC 320 142 - 424 K/uL   MPV 7.1 0 - 99.8 fL   A total of 25 minutes was spent in the room with the patient, greater than 50% of which was in counseling/coordination of care regarding diagnosis, treatment, medications, cyst, and need for follow-up if no better or worse.  ASSESSMENT & PLAN: Xiclali was seen today for sore throat and abdominal pain.  Diagnoses and all orders for this visit:  Sorethroat -     POCT rapid strep A -     Culture, Group A Strep -     POCT CBC -     Comprehensive metabolic panel -     Mononucleosis Test, Qual W/ Reflex  Acute pharyngitis, unspecified etiology  Viral illness    Patient Instructions       If you have lab work done today you will be contacted with your  lab results within the next 2 weeks.  If you have not heard from us then please contact us. The fastest way to get your results is to register for My Chart.   IF you received an x-ray today, you will receive an invoice from [facility]. Please contact [facility] at [phone] with questions or concerns regarding your invoice.   IF you received labwork today, you will receive an invoice from [facility]. Please contact [facility] at [phone] with questions or concerns regarding your invoice.   Our billing staff will not be able to assist you with questions regarding bills from these companies.  You will be contacted with the lab results as soon as they are available. The fastest way to get  your results is to activate your My Chart account. Instructions are located on the last page of this paperwork. If you have not heard from us regarding the results in 2 weeks, please contact this office.     Sore Throat When you have a sore throat, your throat may feel:  Tender.  Burning.  Irritated.  Scratchy.  Painful when you swallow.  Painful when you talk. Many things can cause a sore throat, such as:  An infection.  Allergies.  Dry air.  Smoke or pollution.  Radiation treatment.  Gastroesophageal reflux disease (GERD).  A tumor. A sore throat can be the first sign of another sickness. It can happen with other problems, like:  Coughing.  Sneezing.  Fever.  Swelling in the neck. Most sore throats go away without treatment. Follow these instructions at home:      Take over-the-counter medicines only as told by your doctor. ? If your child has a sore throat, do not give your child aspirin.  Drink enough fluids to keep your pee (urine) pale yellow.  Rest when you feel you need to.  To help with pain: ? Sip warm liquids, such as broth, herbal tea, or warm water. ? Eat or drink cold or frozen liquids, such as frozen ice pops. ? Gargle with a salt-water mixture 3-4 times a day or as needed. To make a salt-water mixture, add -1 tsp (3-6 g) of salt to 1 cup (237 mL) of warm water. Mix it until you cannot see the salt anymore. ? Suck on hard candy or throat lozenges. ? Put a cool-mist humidifier in your bedroom at night. ? Sit in the bathroom with the door closed for 5-10 minutes while you run hot water in the shower.  Do not use any products that contain nicotine or tobacco, such as cigarettes, e-cigarettes, and chewing tobacco. If you need help quitting, ask your doctor.  Wash your hands well and often with soap and water. If soap and water are not available, use hand sanitizer. Contact a doctor if:  You have a fever for more than 2-3 days.  You  keep having symptoms for more than 2-3 days.  Your throat does not get better in 7 days.  You have a fever and your symptoms suddenly get worse.  Your child who is 3 months to 20 years old has a temperature of 102.42F (39C) or higher. Get help right away if:  You have trouble breathing.  You cannot swallow fluids, soft foods, or your saliva.  You have swelling in your throat or neck that gets worse.  You keep feeling sick to your stomach (nauseous).  You keep throwing up (vomiting). Summary  A sore throat is pain, burning, irritation, or scratchiness in the throat.  Many things can cause a sore throat.  Take over-the-counter medicines only as told by your doctor. Do not give your child aspirin.  Drink plenty of fluids, and rest as needed.  Contact a doctor if your symptoms get worse or your sore throat does not get better within 7 days. This information is not intended to replace advice given to you by your health care provider. Make sure you discuss any questions you have with your health care provider. Document Released: 09/05/2008 Document Revised: 04/29/2018 Document Reviewed: 04/29/2018 Elsevier Interactive Patient Education  2019 Elsevier Inc.      Edwina BarthMiguel Tanee Henery, MD [facility]

## 2019-01-02 LAB — COMPREHENSIVE METABOLIC PANEL
A/G RATIO: 1.9 (ref 1.2–2.2)
ALBUMIN: 4.4 g/dL (ref 3.9–5.0)
ALK PHOS: 53 IU/L (ref 39–117)
ALT: 10 IU/L (ref 0–32)
AST: 16 IU/L (ref 0–40)
BUN / CREAT RATIO: 9 (ref 9–23)
BUN: 7 mg/dL (ref 6–20)
Bilirubin Total: 0.2 mg/dL (ref 0.0–1.2)
CALCIUM: 9.4 mg/dL (ref 8.7–10.2)
CO2: 22 mmol/L (ref 20–29)
Chloride: 102 mmol/L (ref 96–106)
Creatinine, Ser: 0.77 mg/dL (ref 0.57–1.00)
GFR calc Af Amer: 129 mL/min/{1.73_m2} (ref >59)
GFR, EST NON AFRICAN AMERICAN: 112 mL/min/{1.73_m2} (ref >59)
GLOBULIN, TOTAL: 2.3 g/dL (ref 1.5–4.5)
Glucose: 101 mg/dL — ABNORMAL HIGH (ref 65–99)
POTASSIUM: 4.8 mmol/L (ref 3.5–5.2)
SODIUM: 142 mmol/L (ref 134–144)
Total Protein: 6.7 g/dL (ref 6.0–8.5)

## 2019-01-02 LAB — MONO QUAL W/RFLX QN: Mono Qual W/Rflx Qn: NEGATIVE

## 2019-01-03 ENCOUNTER — Other Ambulatory Visit: Payer: Self-pay | Admitting: Emergency Medicine

## 2019-01-03 ENCOUNTER — Telehealth: Payer: Self-pay | Admitting: Emergency Medicine

## 2019-01-03 LAB — CULTURE, GROUP A STREP

## 2019-01-03 MED ORDER — AZITHROMYCIN 250 MG PO TABS: ORAL_TABLET | ORAL | 0 refills | Status: DC

## 2019-01-03 NOTE — Telephone Encounter (Signed)
Informed about positive throat culture.  Z-Pak called in.  Advised to start taking it today.

## 2019-01-04 ENCOUNTER — Encounter: Payer: Self-pay | Admitting: *Deleted

## 2019-02-10 ENCOUNTER — Other Ambulatory Visit: Payer: Self-pay | Admitting: Family Medicine

## 2019-02-10 ENCOUNTER — Telehealth: Payer: Self-pay | Admitting: Family Medicine

## 2019-02-10 NOTE — Telephone Encounter (Signed)
Copied from CRM [id number]. Topic: General - Inquiry >> Feb 10, 2019  3:04 PM Baldo Daub L wrote: Reason for CRM:   Pt states that on December 27th he paid for a nebulizer and was told to come back and pick it up the next Monday.  However, pt was told it didn't come in and to wait a few more days.  At that point he forgot about it and is now calling about it because he needed it over the weekend.  Pt wants to know how to go about getting this. Pt can be reached at [phone]

## 2019-02-11 NOTE — Telephone Encounter (Signed)
Please advise 

## 2019-02-11 NOTE — Telephone Encounter (Signed)
Requested medication (s) are due for refill today:   Yes  Requested medication (s) are on the active medication list:   Yes  Future visit scheduled:   No   Last ordered: Saw Dr. Clelia Croft on 12/07/2019.   She ordered him a  Community education officer.  He never got it.   He paid for it and has the receipts to prove it.   His old insurance paid for it.    So he needs the nebulizer machine before he needs the solution.   No one knows what happened that he never received his nebulizer machine.   Please advise.  Requested Prescriptions  Pending Prescriptions Disp Refills   albuterol (PROVENTIL) (2.5 MG/3ML) 0.083% nebulizer solution [Pharmacy Med Name: ALBUTEROL SUL 2.5 MG/3 ML SOLN] 150 mL 1    Sig: TAKE 3 MLS BY NEBULIZATION EVERY 6 (SIX) HOURS AS NEEDED FOR WHEEZING OR SHORTNESS OF BREATH     Pulmonology:  Beta Agonists Failed - 02/10/2019  3:36 PM      Failed - One inhaler should last at least one month. If the patient is requesting refills earlier, contact the patient to check for uncontrolled symptoms.      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

## 2019-02-26 ENCOUNTER — Emergency Department (HOSPITAL_COMMUNITY)
Admission: EM | Admit: 2019-02-26 | Discharge: 2019-02-27 | Disposition: A | Discharge status: Home / Self Care | Payer: PRIVATE HEALTH INSURANCE | Attending: Emergency Medicine | Admitting: Emergency Medicine

## 2019-02-26 ENCOUNTER — Encounter (HOSPITAL_COMMUNITY): Payer: Self-pay | Admitting: Emergency Medicine

## 2019-02-26 ENCOUNTER — Other Ambulatory Visit: Payer: Self-pay

## 2019-02-26 DIAGNOSIS — L539 Erythematous condition, unspecified: Secondary | ICD-10-CM | POA: Diagnosis present

## 2019-02-26 DIAGNOSIS — Z87891 Personal history of nicotine dependence: Secondary | ICD-10-CM | POA: Insufficient documentation

## 2019-02-26 DIAGNOSIS — F1290 Cannabis use, unspecified, uncomplicated: Secondary | ICD-10-CM | POA: Diagnosis not present

## 2019-02-26 DIAGNOSIS — L03011 Cellulitis of right finger: Secondary | ICD-10-CM | POA: Insufficient documentation

## 2019-02-26 MED ORDER — LIDOCAINE HCL 2 % IJ SOLN
20.0000 mL | Freq: Once | INTRAMUSCULAR | Status: AC
  Administered 2019-02-26: 400 mg
  Filled 2019-02-26: qty 20

## 2019-02-26 NOTE — ED Triage Notes (Signed)
Patient reports pain and tenderness / swelling at right distal  middle finger onset this week , denies injury .

## 2019-02-27 MED ORDER — CEPHALEXIN 500 MG PO CAPS: 1000.0000 mg | ORAL_CAPSULE | Freq: Two times a day (BID) | ORAL | 0 refills | Status: DC

## 2019-02-27 NOTE — ED Notes (Signed)
PA at bedside performing incision and drainage.

## 2019-02-27 NOTE — Discharge Instructions (Addendum)
Return here as needed.  Keep the area clean and dry.  Soak in warm water and Epsom salts.

## 2019-02-28 NOTE — ED Provider Notes (Signed)
[facility] EMERGENCY DEPARTMENT Provider Note   CSN: [id number] Arrival date & time: 02/26/19  2321    History   Chief Complaint Chief Complaint  Patient presents with  . Paronychia    HPI Samantha Hunter is a 18 y.o. adult.     HPI Patient presents to the emergency department with swelling around the base of the fingernail on the right middle finger.  The patient states that she does bite her fingernails and has had this happen before.  The patient states that movement and palpation make the pain worse.  Patient states that she not had any fevers, nausea, vomiting, weakness, dizziness or syncope. Past Medical History:  Diagnosis Date  . Allergy   . Anemia   . Anxiety   . Asthma   . Cannabis use disorder, mild, abuse 03/16/2017  . Depression   . Gender dysphoria 05/12/2017  . GERD (gastroesophageal reflux disease)   . Non-suicidal self harm 03/16/2017  . OCD (obsessive compulsive disorder) 03/16/2017  . Suicidal ideation 03/16/2017    Patient Active Problem List   Diagnosis Date Noted  . Sorethroat 01/01/2019  . Acute pharyngitis 01/01/2019  . Viral illness 01/01/2019  . Odynophagia 03/23/2018  . Depression 03/23/2018  . Anxiety 03/23/2018  . Asthma 03/23/2018  . GERD (gastroesophageal reflux disease) 03/23/2018  . Gender identity: Female identifies as female 03/23/2018  . Narrowing of airway   . 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 Long-term current use of testosterone cypionate 02/08/2018  . MDD (major depressive disorder), recurrent episode (HCC) 06/01/2017  . Gender dysphoria 05/12/2017  . 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 Cannabis use disorder, mild, abuse 03/16/2017  . OCD (obsessive compulsive disorder) 03/16/2017  . Suicidal ideation 03/16/2017  . Non-suicidal self harm 03/16/2017  . MDD (major depressive disorder), recurrent severe, without psychosis (HCC) 03/15/2017     Past Surgical History:  Procedure Laterality Date  . wisdom tooth removal Bilateral      OB History   No obstetric history on file.      Home Medications    Prior to Admission medications   Medication Sig Start Date End Date Taking? Authorizing Provider  albuterol (PROVENTIL HFA;VENTOLIN HFA) 108 (90 Base) MCG/ACT inhaler Inhale 2 puffs into the lungs every 4 (four) hours as needed for wheezing or shortness of breath (cough, shortness of breath or wheezing.). 12/06/18   Sherren Mocha, MD  albuterol (PROVENTIL) (2.5 MG/3ML) 0.083% nebulizer solution TAKE 3 MLS BY NEBULIZATION EVERY 6 (SIX) HOURS AS NEEDED FOR WHEEZING OR SHORTNESS OF BREATH 02/11/19   Sherren Mocha, MD  azithromycin [facility]) 250 MG tablet Sig as indicated 01/03/19   Georgina Quint, MD  budesonide-formoterol [facility]) 160-4.5 MCG/ACT inhaler Inhale 2 puffs into the lungs 2 (two) times daily. 12/06/18   Sherren Mocha, MD  cephALEXin (KEFLEX) 500 MG capsule Take 2 capsules (1,000 mg total) by mouth 2 (two) times daily. 02/27/19   Briceyda Abdullah, Cristal Deer, PA-C  IBUPROFEN PO Take by mouth as needed.    [provider]  NEEDLE, DISP, 18 G 18G X 1-1/2" MISC 1 Units by Does not apply route as needed. 12/06/18   Sherren Mocha, MD  NEEDLE, DISP, 23 G 23G X 3/4" MISC 1 Units by Does not apply route as needed. 12/06/18   Sherren Mocha, MD  Syringe, Disposable, 1 ML MISC 1 Units by Does not apply route as needed. 07/01/18   Sherren Mocha, MD  testosterone cypionate (DEPOTESTOSTERONE CYPIONATE) 200 MG/ML injection Inject 0.7 mLs (140 mg total) into the muscle every 14 (fourteen) days. 12/08/18   Sherren Mocha, MD    Family History Family History  Problem Relation Age of Onset  . Mental illness Mother   . Alcohol abuse Mother   . Drug abuse Mother   . Depression Mother   . Hyperlipidemia Mother   . Mental illness Father   . Alcohol abuse Father   . Drug abuse Father   . Depression Father   . Cancer Father        skin  . Heart  disease Father   . Hyperlipidemia Father   . Stroke Father   . Mental illness Brother   . Anxiety disorder Brother   . Depression Brother   . Anxiety disorder Sister   . Depression Sister   . Mental illness Sister   . Alcohol abuse Maternal Uncle   . Drug abuse Maternal Uncle   . Alcohol abuse Maternal Grandfather   . Mental illness Brother   . Mental illness Sister     Social History Social History   Tobacco Use  . Smoking status: Former Smoker    Packs/day: 0.25    Years: 1.00    Pack years: 0.25    Types: Cigarettes    Last attempt to quit: 02/08/2018    Years since quitting: 1.0  . Smokeless tobacco: Former Neurosurgeon    Types: Chew  Substance Use Topics  . Alcohol use: No  . Drug use: Yes    Frequency: 14.0 times per week    Types: Marijuana     Allergies   Pineapple   Review of Systems Review of Systems All other systems negative except as documented in the HPI. All pertinent positives and negatives as reviewed in the HPI.  Physical Exam Updated Vital Signs BP (!) 150/79 (BP Location: Right Arm)   Pulse (!) 107   Temp 98.6 F (37 C) (Oral)   Resp 18   Ht 5\' 5"  (1.651 m)   Wt 104.3 kg   SpO2 98%   BMI 38.27 kg/m   Physical Exam Vitals signs and nursing note reviewed.  Constitutional:      General: She is not in acute distress.    Appearance: She is well-developed.  HENT:     Head: Normocephalic and atraumatic.  Eyes:     Pupils: Pupils are equal, round, and reactive to light.  Pulmonary:     Effort: Pulmonary effort is normal.  Musculoskeletal:       Hands:  Skin:    General: Skin is warm and dry.  Neurological:     Mental Status: She is alert and oriented to person, place, and time.      ED Treatments / Results  Labs (all labs ordered are listed, but only abnormal results are displayed) Labs Reviewed - No data to display  EKG None  Radiology No results found.  Procedures .Marland KitchenIncision and Drainage Date/Time: 02/28/2019 6:12 AM  Performed by: Charlestine Night, PA-C Authorized by: Charlestine Night, PA-C   Consent:    Consent obtained:  Verbal   Consent given by:  Patient   Risks discussed:  Bleeding and incomplete drainage   Alternatives discussed:  No treatment, delayed treatment, observation and alternative treatment Location:    Type:  Abscess   Location:  Upper extremity   Upper extremity location:  Finger   Finger location:  R long finger Pre-procedure details:    Skin  preparation:  Antiseptic wash Anesthesia (see MAR for exact dosages):    Anesthesia method:  Nerve block   Block needle gauge:  27 G   Block anesthetic:  Lidocaine 2% w/o epi   Block outcome:  Anesthesia achieved Procedure type:    Complexity:  Simple Procedure details:    Needle aspiration: no     Incision types:  Single straight   Incision depth:  Submucosal   Scalpel blade:  11   Wound management:  Probed and deloculated and irrigated with saline   Drainage:  Purulent and bloody   Drainage amount:  Moderate   Wound treatment:  Wound left open   Packing materials:  None   (including critical care time)  Medications Ordered in ED Medications  lidocaine (XYLOCAINE) 2 % (with pres) injection 400 mg (400 mg Infiltration Given 02/26/19 2357)    Patient is advised to return here as needed.  Told to keep the area clean and dry.  Told to soak with warm water and Epsom salts. Initial Impression / Assessment and Plan / ED Course  I have reviewed the triage vital signs and the nursing notes.  Pertinent labs & imaging results that were available during my care of the patient were reviewed by me and considered in my medical decision making (see chart for details).       .  Final Clinical Impressions(s) / ED Diagnoses   Final diagnoses:  Paronychia of finger of right hand    ED Discharge Orders         Ordered    cephALEXin (KEFLEX) 500 MG capsule  2 times daily     02/27/19 0050           Charlestine Night,  PA-C 02/28/19 1610    Wynetta Fines, MD 03/03/19 [phone]

## 2019-03-06 ENCOUNTER — Other Ambulatory Visit: Payer: Self-pay

## 2019-03-06 ENCOUNTER — Emergency Department (HOSPITAL_COMMUNITY): Payer: PRIVATE HEALTH INSURANCE

## 2019-03-06 ENCOUNTER — Emergency Department (HOSPITAL_COMMUNITY)
Admission: EM | Admit: 2019-03-06 | Discharge: 2019-03-06 | Disposition: A | Discharge status: Home / Self Care | Payer: PRIVATE HEALTH INSURANCE | Attending: Emergency Medicine | Admitting: Emergency Medicine

## 2019-03-06 ENCOUNTER — Encounter (HOSPITAL_COMMUNITY): Payer: Self-pay | Admitting: Emergency Medicine

## 2019-03-06 DIAGNOSIS — J45909 Unspecified asthma, uncomplicated: Secondary | ICD-10-CM | POA: Insufficient documentation

## 2019-03-06 DIAGNOSIS — Y93K1 Activity, walking an animal: Secondary | ICD-10-CM | POA: Diagnosis not present

## 2019-03-06 DIAGNOSIS — Y929 Unspecified place or not applicable: Secondary | ICD-10-CM | POA: Diagnosis not present

## 2019-03-06 DIAGNOSIS — Z79899 Other long term (current) drug therapy: Secondary | ICD-10-CM | POA: Diagnosis not present

## 2019-03-06 DIAGNOSIS — Z87891 Personal history of nicotine dependence: Secondary | ICD-10-CM | POA: Diagnosis not present

## 2019-03-06 DIAGNOSIS — W2209XA Striking against other stationary object, initial encounter: Secondary | ICD-10-CM | POA: Insufficient documentation

## 2019-03-06 DIAGNOSIS — S0990XA Unspecified injury of head, initial encounter: Secondary | ICD-10-CM | POA: Diagnosis present

## 2019-03-06 DIAGNOSIS — Y998 Other external cause status: Secondary | ICD-10-CM | POA: Diagnosis not present

## 2019-03-06 MED ORDER — METOCLOPRAMIDE HCL 5 MG/ML IJ SOLN
10.0000 mg | Freq: Once | INTRAMUSCULAR | Status: AC
  Administered 2019-03-06: 10 mg via INTRAMUSCULAR
  Filled 2019-03-06: qty 2

## 2019-03-06 MED ORDER — KETOROLAC TROMETHAMINE 30 MG/ML IJ SOLN
30.0000 mg | Freq: Once | INTRAMUSCULAR | Status: AC
  Administered 2019-03-06: 30 mg via INTRAMUSCULAR
  Filled 2019-03-06: qty 1

## 2019-03-06 NOTE — ED Triage Notes (Addendum)
Patient accidentally hit his head against a metal railing and fell last night, denies LOC ,ambulatory , reports headache and tingling at arms and legs.

## 2019-03-06 NOTE — ED Provider Notes (Signed)
[facility] EMERGENCY DEPARTMENT Provider Note   CSN: [id number] Arrival date & time: 03/06/19  0027    History   Chief Complaint Chief Complaint  Patient presents with  . Fall    HPI Samantha Hunter is a 18 y.o. adult.     Patient presents to the emergency department for evaluation of head injury.  States that he was walking his dog when the dog pulled on the leash and he struck his head on a metal railing.  He had no loss of consciousness, but has been experiencing a constant occipital headache with numbness and paresthesias in his extremities.  Also reporting nausea and photophobia.  No medications taken prior to arrival for symptoms.  Consulted with tele-doc who recommended ED evaluation.  Denies loss of vision, vomiting, extremity weakness.  Not on chronic anticoagulation.  The history is provided by the patient. No language interpreter was used.  Fall     Past Medical History:  Diagnosis Date  . Allergy   . Anemia   . Anxiety   . Asthma   . Cannabis use disorder, mild, abuse 03/16/2017  . Depression   . Gender dysphoria 05/12/2017  . GERD (gastroesophageal reflux disease)   . Non-suicidal self harm 03/16/2017  . OCD (obsessive compulsive disorder) 03/16/2017  . Suicidal ideation 03/16/2017    Patient Active Problem List   Diagnosis Date Noted  . Sorethroat 01/01/2019  . Acute pharyngitis 01/01/2019  . Viral illness 01/01/2019  . Odynophagia 03/23/2018  . Depression 03/23/2018  . Anxiety 03/23/2018  . Asthma 03/23/2018  . GERD (gastroesophageal reflux disease) 03/23/2018  . Gender identity: Female identifies as female 03/23/2018  . Narrowing of airway   . 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 Long-term current use of testosterone cypionate 02/08/2018  . MDD (major depressive disorder), recurrent episode (HCC) 06/01/2017  . Gender dysphoria 05/12/2017  . Major depressive disorder, recurrent severe without psychotic features (HCC)  05/11/2017  . Cannabis use disorder, mild, abuse 03/16/2017  . OCD (obsessive compulsive disorder) 03/16/2017  . Suicidal ideation 03/16/2017  . Non-suicidal self harm 03/16/2017  . MDD (major depressive disorder), recurrent severe, without psychosis (HCC) 03/15/2017    Past Surgical History:  Procedure Laterality Date  . wisdom tooth removal Bilateral      OB History   No obstetric history on file.      Home Medications    Prior to Admission medications   Medication Sig Start Date End Date Taking? Authorizing Provider  albuterol (PROVENTIL HFA;VENTOLIN HFA) 108 (90 Base) MCG/ACT inhaler Inhale 2 puffs into the lungs every 4 (four) hours as needed for wheezing or shortness of breath (cough, shortness of breath or wheezing.). 12/06/18   Sherren Mocha, MD  albuterol (PROVENTIL) (2.5 MG/3ML) 0.083% nebulizer solution TAKE 3 MLS BY NEBULIZATION EVERY 6 (SIX) HOURS AS NEEDED FOR WHEEZING OR SHORTNESS OF BREATH 02/11/19   Sherren Mocha, MD  azithromycin [facility]) 250 MG tablet Sig as indicated 01/03/19   Georgina Quint, MD  budesonide-formoterol [facility]) 160-4.5 MCG/ACT inhaler Inhale 2 puffs into the lungs 2 (two) times daily. 12/06/18   Sherren Mocha, MD  cephALEXin (KEFLEX) 500 MG capsule Take 2 capsules (1,000 mg total) by mouth 2 (two) times daily. 02/27/19   Lawyer, Cristal Deer, PA-C  IBUPROFEN PO Take by mouth as needed.    [provider]  NEEDLE, DISP, 18 G 18G X 1-1/2" MISC 1 Units by Does not apply route as needed. 12/06/18   Norberto Sorenson  N, MD  NEEDLE, DISP, 23 G 23G X 3/4" MISC 1 Units by Does not apply route as needed. 12/06/18   Sherren Mocha, MD  Syringe, Disposable, 1 ML MISC 1 Units by Does not apply route as needed. 07/01/18   Sherren Mocha, MD  testosterone cypionate (DEPOTESTOSTERONE CYPIONATE) 200 MG/ML injection Inject 0.7 mLs (140 mg total) into the muscle every 14 (fourteen) days. 12/08/18   Sherren Mocha, MD    Family History Family History  Problem Relation  Age of Onset  . Mental illness Mother   . Alcohol abuse Mother   . Drug abuse Mother   . Depression Mother   . Hyperlipidemia Mother   . Mental illness Father   . Alcohol abuse Father   . Drug abuse Father   . Depression Father   . Cancer Father        skin  . Heart disease Father   . Hyperlipidemia Father   . Stroke Father   . Mental illness Brother   . Anxiety disorder Brother   . Depression Brother   . Anxiety disorder Sister   . Depression Sister   . Mental illness Sister   . Alcohol abuse Maternal Uncle   . Drug abuse Maternal Uncle   . Alcohol abuse Maternal Grandfather   . Mental illness Brother   . Mental illness Sister     Social History Social History   Tobacco Use  . Smoking status: Former Smoker    Packs/day: 0.25    Years: 1.00    Pack years: 0.25    Types: Cigarettes    Last attempt to quit: 02/08/2018    Years since quitting: 1.0  . Smokeless tobacco: Former Neurosurgeon    Types: Chew  Substance Use Topics  . Alcohol use: No  . Drug use: Yes    Frequency: 14.0 times per week    Types: Marijuana     Allergies   Pineapple   Review of Systems Review of Systems Ten systems reviewed and are negative for acute change, except as noted in the HPI.    Physical Exam Updated Vital Signs BP 118/71   Pulse 97   Temp 97.9 F (36.6 C) (Oral)   Resp 17   Ht 5' 5.5" (1.664 m)   Wt 106.6 kg   SpO2 95%   BMI 38.51 kg/m   Physical Exam Vitals signs and nursing note reviewed.  Constitutional:      General: She is not in acute distress.    Appearance: She is well-developed. She is not diaphoretic.     Comments: Nontoxic-appearing and in no distress  HENT:     Head: Normocephalic and atraumatic.     Comments: No hematoma or contusion to scalp.  No battle sign or raccoon's eyes.    Right Ear: Tympanic membrane, ear canal and external ear normal.     Left Ear: Tympanic membrane, ear canal and external ear normal.     Ears:     Comments: No hemotympanum  bilaterally    Mouth/Throat:     Mouth: Mucous membranes are moist.     Comments: Symmetric rise of the uvula with phonation Eyes:     General: No scleral icterus.    Conjunctiva/sclera: Conjunctivae normal.     Pupils: Pupils are equal, round, and reactive to light.  Neck:     Musculoskeletal: Normal range of motion.  Pulmonary:     Effort: Pulmonary effort is normal. No respiratory distress.  Comments: Respirations even and unlabored Musculoskeletal: Normal range of motion.  Skin:    General: Skin is warm and dry.     Coloration: Skin is not pale.     Findings: No erythema or rash.  Neurological:     General: No focal deficit present.     Mental Status: She is alert and oriented to person, place, and time.     Cranial Nerves: No cranial nerve deficit.     Sensory: No sensory deficit.     Coordination: Coordination normal.     Comments: GCS 15. Speech is goal oriented. No cranial nerve deficits appreciated; symmetric eyebrow raise, no facial drooping, tongue midline. Patient has equal grip strength bilaterally with 5/5 strength against resistance in all major muscle groups bilaterally. Sensation to light touch intact. Patient moves extremities without ataxia.  Psychiatric:        Behavior: Behavior normal.      ED Treatments / Results  Labs (all labs ordered are listed, but only abnormal results are displayed) Labs Reviewed - No data to display  EKG None  Radiology Ct Head Wo Contrast  Result Date: 03/06/2019 CLINICAL DATA:  Head trauma, minor, GCS>=13, low clinical risk, initial exam. Struck head against metal railing during fall. EXAM: CT HEAD WITHOUT CONTRAST TECHNIQUE: Contiguous axial images were obtained from the base of the skull through the vertex without intravenous contrast. COMPARISON:  None. FINDINGS: Brain: No intracranial hemorrhage, mass effect, or midline shift. No hydrocephalus. The basilar cisterns are patent. No evidence of territorial infarct or acute  ischemia. No extra-axial or intracranial fluid collection. Vascular: No hyperdense vessel or unexpected calcification. Skull: Normal. Negative for fracture or focal lesion. Sinuses/Orbits: Paranasal sinuses and mastoid air cells are clear. The visualized orbits are unremarkable. Other: None. IMPRESSION: Negative noncontrast head CT. No evidence of acute traumatic injury. Electronically Signed   By: Narda Rutherford M.D.   On: 03/06/2019 01:32    Procedures Procedures (including critical care time)  Medications Ordered in ED Medications  metoCLOPramide (REGLAN) injection 10 mg (10 mg Intramuscular Given 03/06/19 0134)  ketorolac (TORADOL) 30 MG/ML injection 30 mg (30 mg Intramuscular Given 03/06/19 0135)     Initial Impression / Assessment and Plan / ED Course  I have reviewed the triage vital signs and the nursing notes.  Pertinent labs & imaging results that were available during my care of the patient were reviewed by me and considered in my medical decision making (see chart for details).        18 year old female presenting to the emergency department for evaluation of head injury.  She had no loss of consciousness, but has been experiencing a subsequent occipital headache with photophobia, intermittent blurry vision, extremity numbness and paresthesias.  Has no complaints of neck pain.  Neurologic exam nonfocal.  She did undergo head CT which is reassuring, negative for emergent intracranial etiology including skull fracture or hemorrhage.  Symptoms treated with Toradol and Reglan.  We will continue with outpatient use of ibuprofen or Excedrin Migraine.  Discussed head injury precautions and encouraged primary care follow-up in 1 week.  Return precautions discussed and provided. Patient discharged in stable condition with no unaddressed concerns.   Final Clinical Impressions(s) / ED Diagnoses   Final diagnoses:  Injury of head, initial encounter    ED Discharge Orders    None        Antony Madura, PA-C 03/06/19 1610    Zadie Rhine, MD 03/06/19 [phone]

## 2019-03-06 NOTE — Discharge Instructions (Addendum)
You had a normal neurologic exam while in the emergency department and a negative head CT.  This is reassuring.  It is possible that you may have a mild concussion.  A concussion is a diagnosis that is made clinically and does not show any abnormal results on imaging such as a CT scan or MRI.    A concussion may cause a persistent headache over the next few days. This can be brought on or worsened by loud sounds or bright lights. Try to avoid excessive use of cell phones, television, video games as this may worsening headaches.  Take tylenol, Ibuprofen, or Excedrine migraine for management of persistent headache symptoms.  Avoid strenuous activity and heavy lifting over the next few days.  If you develop severe worsening of your headache, vision changes or loss, uncontrolled vomiting, numbness or tingling to one side of your body, difficulty walking or lifting your arms or legs, return promptly to the emergency department for repeat evaluation.

## 2019-03-28 ENCOUNTER — Telehealth: Payer: Self-pay

## 2019-03-28 NOTE — Telephone Encounter (Signed)
I called and left patient a message about upcoming appointment with Dr. Graciela Husbands, explained that due to the Covid-19 we are trying to switch over as many patients as possible to virtual telehealth visits. Advised patient to call back if she could or could not do a telehealth visit.

## 2019-04-02 NOTE — Telephone Encounter (Signed)
I called and left patient another message about upcoming appointment tomorrow with Dr. Graciela Husbands. Need consent and need to know if patient has access to a smart phone for video visit.

## 2019-04-03 ENCOUNTER — Other Ambulatory Visit: Payer: Self-pay

## 2019-04-03 ENCOUNTER — Telehealth: Payer: PRIVATE HEALTH INSURANCE | Admitting: Internal Medicine

## 2019-04-03 NOTE — Progress Notes (Deleted)
Electrophysiology TeleHealth Note   Due to national recommendations of social distancing due to COVID 19, an audio/video telehealth visit is felt to be most appropriate for this patient at this time.  See MyChart message from today for the patient's consent to telehealth for [facility].   Date:  04/03/2019   ID:  Samantha Hunter, DOB [birth year], MRN [id number]  Location: patient's home  Provider location: [address], [place]  Evaluation Performed: Initial Evaluation  PCP:  Sherren MochaShaw, Eva N, MD  Cardiologist:  No primary care provider on file. *** Electrophysiologist:  None   Chief Complaint:  ***  History of Present Illness:    Samantha Hunter is a 18 y.o. adult who presents via audio/video conferencing for a telehealth visit today for  ***              The patient denies symptoms of fevers, chills, cough, or new SOB worrisome for COVID 19. ***  Past Medical History:  Diagnosis Date  . Allergy   . Anemia   . Anxiety   . Asthma   . Cannabis use disorder, mild, abuse 03/16/2017  . Depression   . Gender dysphoria 05/12/2017  . GERD (gastroesophageal reflux disease)   . Non-suicidal self harm 03/16/2017  . OCD (obsessive compulsive disorder) 03/16/2017  . Suicidal ideation 03/16/2017    Past Surgical History:  Procedure Laterality Date  . wisdom tooth removal Bilateral     Current Outpatient Medications  Medication Sig Dispense Refill  . albuterol (PROVENTIL HFA;VENTOLIN HFA) 108 (90 Base) MCG/ACT inhaler Inhale 2 puffs into the lungs every 4 (four) hours as needed for wheezing or shortness of breath (cough, shortness of breath or wheezing.). 3 Inhaler 1  . albuterol (PROVENTIL) (2.5 MG/3ML) 0.083% nebulizer solution TAKE 3 MLS BY NEBULIZATION EVERY 6 (SIX) HOURS AS NEEDED FOR WHEEZING OR SHORTNESS OF BREATH 150 mL 1  . azithromycin (ZITHROMAX) 250 MG tablet Sig as indicated 6 tablet 0  . budesonide-formoterol (SYMBICORT) 160-4.5 MCG/ACT inhaler Inhale  2 puffs into the lungs 2 (two) times daily. 3 Inhaler 2  . cephALEXin (KEFLEX) 500 MG capsule Take 2 capsules (1,000 mg total) by mouth 2 (two) times daily. 28 capsule 0  . IBUPROFEN PO Take by mouth as needed.    Marland Kitchen. NEEDLE, DISP, 18 G 18G X 1-1/2" MISC 1 Units by Does not apply route as needed. 100 each 0  . NEEDLE, DISP, 23 G 23G X 3/4" MISC 1 Units by Does not apply route as needed. 100 each 0  . Syringe, Disposable, 1 ML MISC 1 Units by Does not apply route as needed. 100 each 0  . testosterone cypionate (DEPOTESTOSTERONE CYPIONATE) 200 MG/ML injection Inject 0.7 mLs (140 mg total) into the muscle every 14 (fourteen) days. 10 mL 0   No current facility-administered medications for this visit.     Allergies:   Pineapple   Social History:  The patient  reports that she quit smoking about 13 months ago. Her smoking use included cigarettes. She has a 0.25 pack-year smoking history. She has quit using smokeless tobacco.  Her smokeless tobacco use included chew. She reports current drug use. Frequency: 14.00 times per week. Drug: Marijuana. She reports that she does not drink alcohol.   Family History:  The patient's *** family history includes Alcohol abuse in her father, maternal grandfather, maternal uncle, and mother; Anxiety disorder in her brother and sister; Cancer in her father; Depression in her brother, father, mother, and  sister; Drug abuse in her father, maternal uncle, and mother; Heart disease in her father; Hyperlipidemia in her father and mother; Mental illness in her brother, brother, father, mother, sister, and sister; Stroke in her father.   ROS:  Please see the history of present illness.   All other systems are personally reviewed and negative.    Exam:    Vital Signs:  There were no vitals taken for this visit. ***  Well appearing, alert and conversant, regular work of breathing,  good skin color Eyes- anicteric, neuro- grossly intact, skin- no apparent rash or lesions or  cyanosis, mouth- oral mucosa is pink   Labs/Other Tests and Data Reviewed:    Recent Labs: 01/01/2019: ALT 10; BUN 7; Creatinine, Ser 0.77; Hemoglobin 14.5; Potassium 4.8; Sodium 142   Wt Readings from Last 3 Encounters:  03/06/19 235 lb (106.6 kg)  02/26/19 230 lb (104.3 kg)  01/01/19 244 lb 12.8 oz (111 kg) (99 %, Z= 2.30)*   * Growth percentiles are based on CDC (Boys, 2-20 Years) data.     Other studies personally reviewed: Additional studies/ records that were reviewed today include: ***  Review of the above records today demonstrates: *** Prior radiographs: ***  The patient presents wearable device technology report for my review today. On my review, the tracings reveal ***      ASSESSMENT & PLAN:    ***     COVID 19 screen The patient denies symptoms of COVID 19 at this time.  The importance of social distancing was discussed today.  Follow-up:  *** Next remote: ***  Current medicines are reviewed at length with the patient today.   The patient {ACTIONS; HAS/DOES NOT HAVE:19233} concerns regarding her medicines.  The following changes were made today:  {NONE DEFAULTED:18576::"none"}  Labs/ tests ordered today include: *** No orders of the defined types were placed in this encounter.   Future tests ( post COVID )  *** in ***  months  Patient Risk:  after full review of this patients clinical status, I feel that they are at moderate risk at this time.  Today, I have spent *** minutes with the patient with telehealth technology discussing *** .    Signed, Sherryl Manges, MD  04/03/2019 4:15 PM     [facility] [address] [place] [address] [phone] (office) [phone] (fax)

## 2019-04-04 NOTE — Telephone Encounter (Signed)
Patient did not call back and did not have telehealth visit yesterday.

## 2019-05-14 ENCOUNTER — Telehealth (INDEPENDENT_AMBULATORY_CARE_PROVIDER_SITE_OTHER): Payer: PRIVATE HEALTH INSURANCE | Admitting: Family Medicine

## 2019-05-14 ENCOUNTER — Encounter: Payer: Self-pay | Admitting: Family Medicine

## 2019-05-14 VITALS — Temp 97.7°F | Ht 65.0 in | Wt 277.0 lb

## 2019-05-14 DIAGNOSIS — Z20822 Contact with and (suspected) exposure to covid-19: Secondary | ICD-10-CM | POA: Insufficient documentation

## 2019-05-14 DIAGNOSIS — R6889 Other general symptoms and signs: Secondary | ICD-10-CM

## 2019-05-14 NOTE — Progress Notes (Signed)
Chief Complaint :  Pt state that he had a sore throat Monday and Tuesday, Pt states a fever of 101.6 on Monday night. Pt states that he is feeling better today but need to be tested for Covid 19 to return to work due his boss will not allow him to come back to work with out a note .

## 2019-05-14 NOTE — Progress Notes (Signed)
Telemedicine Encounter- SOAP NOTE Established Patient  I discussed the limitations, risks, security and privacy concerns of performing an evaluation and management service by telephone and the availability of in person appointments. I also discussed with the patient that there may be a patient responsible charge related to this service. The patient expressed understanding and agreed to proceed.  This telephone encounter was conducted with the patient's (or proxy's) verbal consent via audio telecommunications:yes Patient was instructed to have this encounter in a suitably private space; and to only have persons present to whom they give permission to participate. In addition, patient identity was confirmed by use of name plus two identifiers (DOB and address).  I spent a total of talking with the patient or their proxy.   Subjective  Chief Complaint :  Pt state that he had a sore throat Monday and Tuesday, Pt states a fever of 101.6 on Monday night. Pt states that he is feeling better today but need to be tested for Covid 19 to return to work due his boss will not allow him to come back to work with out a note .           Samantha Hunter is a 18 y.o. established patient. Telephone visit today for concerns about COVID symptoms. Pt wit elevated temp on Monday 101 with s/t and fatigue-no cough, no diarrhea, no SOB. No exposure noted. 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 [employer] will n    Patient Active Problem List   Diagnosis Date Noted  . Sorethroat 01/01/2019  . Acute pharyngitis 01/01/2019  . Viral illness 01/01/2019  . Odynophagia 03/23/2018  . Depression 03/23/2018  . Anxiety 03/23/2018  . Asthma 03/23/2018  . GERD (gastroesophageal reflux disease) 03/23/2018  . Gender identity: Female identifies as female 03/23/2018  . Narrowing of airway   . 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 Long-term current use of testosterone  cypionate 02/08/2018  . MDD (major depressive disorder), recurrent episode (HCC) 06/01/2017  . Gender dysphoria 05/12/2017  . 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 Cannabis use disorder, mild, abuse 03/16/2017  . OCD (obsessive compulsive disorder) 03/16/2017  . Suicidal ideation 03/16/2017  . Non-suicidal self harm 03/16/2017  . MDD (major depressive disorder), recurrent severe, without psychosis (HCC) 03/15/2017    Past Medical History:  Diagnosis Date  . Allergy   . Anemia   . Anxiety   . Asthma   . Cannabis use disorder, mild, abuse 03/16/2017  . Depression   . Gender dysphoria 05/12/2017  . GERD (gastroesophageal reflux disease)   . Non-suicidal self harm 03/16/2017  . OCD (obsessive compulsive disorder) 03/16/2017  . Suicidal ideation 03/16/2017    Current Outpatient Medications  Medication Sig Dispense Refill  . albuterol (PROVENTIL HFA;VENTOLIN HFA) 108 (90 Base) MCG/ACT inhaler Inhale 2 puffs into the lungs every 4 (four) hours as needed for wheezing or shortness of breath (cough, shortness of breath or wheezing.). 3 Inhaler 1  . albuterol (PROVENTIL) (2.5 MG/3ML) 0.083% nebulizer solution TAKE 3 MLS BY NEBULIZATION EVERY 6 (SIX) HOURS AS NEEDED FOR WHEEZING OR SHORTNESS OF BREATH 150 mL 1  . azithromycin (ZITHROMAX) 250 MG tablet Sig as indicated 6 tablet 0  . budesonide-formoterol (SYMBICORT) 160-4.5 MCG/ACT inhaler Inhale 2 puffs into the lungs 2 (two) times daily. 3 Inhaler 2  . IBUPROFEN PO Take by mouth as needed.    Marland Kitchen NEEDLE, DISP, 18  G 18G X 1-1/2" MISC 1 Units by Does not apply route as needed. 100 each 0  . NEEDLE, DISP, 23 G 23G X 3/4" MISC 1 Units by Does not apply route as needed. 100 each 0  . Syringe, Disposable, 1 ML MISC 1 Units by Does not apply route as needed. 100 each 0  . testosterone cypionate (DEPOTESTOSTERONE CYPIONATE) 200 MG/ML injection Inject 0.7 mLs (140 mg total) into the muscle every 14 (fourteen)  days. 10 mL 0  . cephALEXin (KEFLEX) 500 MG capsule Take 2 capsules (1,000 mg total) by mouth 2 (two) times daily. (Patient not taking: Reported on 05/14/2019) 28 capsule 0   No current facility-administered medications for this visit.     Allergies  Allergen Reactions  . Pineapple Hives and Itching    Social History   Socioeconomic History  . Marital status: Single    Spouse name: Not on file  . Number of children: Not on file  . Years of education: Not on file  . Highest education level: Not on file  Occupational History  . Not on file  Social Needs  . Financial resource strain: Not on file  . Food insecurity:    Worry: Not on file    Inability: Not on file  . Transportation needs:    Medical: Not on file    Non-medical: Not on file  Tobacco Use  . Smoking status: Former Smoker    Packs/day: 0.25    Years: 1.00    Pack years: 0.25    Types: Cigarettes    Last attempt to quit: 02/08/2018    Years since quitting: 1.2  . Smokeless tobacco: Former Neurosurgeon    Types: Chew  Substance and Sexual Activity  . Alcohol use: No  . Drug use: Yes    Frequency: 14.0 times per week    Types: Marijuana  . Sexual activity: Yes    Birth control/protection: Surgical, Other-see comments    Comment: spouse has had vasectomy, pt amenorrheic on testosterone  Lifestyle  . Physical activity:    Days per week: Not on file    Minutes per session: Not on file  . Stress: Not on file  Relationships  . Social connections:    Talks on phone: Not on file    Gets together: Not on file    Attends religious service: Not on file    Active member of club or organization: Not on file    Attends meetings of clubs or organizations: Not on file    Relationship status: Not on file  . Intimate partner violence:    Fear of current or ex partner: Not on file    Emotionally abused: Not on file    Physically abused: Not on file    Forced sexual activity: Not on file  Other Topics Concern  . Not on file   Social History Narrative  . Not on file    Review of Systems  Constitutional: Positive for malaise/fatigue.  HENT: Positive for sore throat.   Respiratory: Negative for cough and shortness of breath.     Objective   Vitals as reported by the patient: Today's Vitals   05/14/19 1427  Temp: 97.7 F (36.5 C)  TempSrc: Oral  Weight: 277 lb (125.6 kg)  Height:  (1.651 m)   1. Suspected Covid-19 Virus Infection Elevated temp with fatigue, s/t-pt now allowed to RTW unless note stating they do not have COVID  I discussed the assessment and treatment plan with  the patient. The patient was provided an opportunity to ask questions and all were answered. The patient agreed with the plan and demonstrated an understanding of the instructions.   The patient was advised to call back or seek an in-person evaluation if the symptoms worsen or if the condition fails to improve as anticipated.  I provided 8 minutes of non-face-to-face time during this encounter.  Yang Rack Mat CarneLEIGH Cela Newcom, MD  Primary [facility] at [facility] 05-14-19

## 2019-06-17 NOTE — Telephone Encounter (Signed)
This encounter was created in error - please disregard.

## 2019-07-10 ENCOUNTER — Emergency Department (HOSPITAL_COMMUNITY)
Admission: EM | Admit: 2019-07-10 | Discharge: 2019-07-11 | Disposition: A | Discharge status: Home / Self Care | Payer: PRIVATE HEALTH INSURANCE | Attending: Emergency Medicine | Admitting: Emergency Medicine

## 2019-07-10 ENCOUNTER — Other Ambulatory Visit: Payer: Self-pay

## 2019-07-10 ENCOUNTER — Encounter (HOSPITAL_COMMUNITY): Payer: Self-pay | Admitting: Emergency Medicine

## 2019-07-10 DIAGNOSIS — Z8709 Personal history of other diseases of the respiratory system: Secondary | ICD-10-CM | POA: Insufficient documentation

## 2019-07-10 DIAGNOSIS — F1290 Cannabis use, unspecified, uncomplicated: Secondary | ICD-10-CM | POA: Insufficient documentation

## 2019-07-10 DIAGNOSIS — Z87891 Personal history of nicotine dependence: Secondary | ICD-10-CM | POA: Insufficient documentation

## 2019-07-10 DIAGNOSIS — H60502 Unspecified acute noninfective otitis externa, left ear: Secondary | ICD-10-CM | POA: Insufficient documentation

## 2019-07-10 NOTE — ED Triage Notes (Signed)
Patient reports worsening left ear ache for several days with drainage , no hearing loss or injury , seen at an urgent care today prescribed with Ciprodex but did not fill prescription .

## 2019-07-11 MED ORDER — ACETAMINOPHEN 500 MG PO TABS
1000.0000 mg | ORAL_TABLET | Freq: Once | ORAL | Status: AC
  Administered 2019-07-11: 02:00:00 1000 mg via ORAL
  Filled 2019-07-11: qty 2

## 2019-07-11 MED ORDER — AMOXICILLIN-POT CLAVULANATE 875-125 MG PO TABS: 1.0000 | ORAL_TABLET | Freq: Two times a day (BID) | ORAL | 0 refills | Status: DC

## 2019-07-11 MED ORDER — CIPROFLOXACIN-DEXAMETHASONE 0.3-0.1 % OT SUSP
4.0000 [drp] | Freq: Once | OTIC | Status: DC
  Filled 2019-07-11: qty 7.5

## 2019-07-11 NOTE — ED Provider Notes (Signed)
[facility] EMERGENCY [facility]PART[facility] Provider Note   CSN: [id number] Arrival date & time: 07/10/19  2247     History   Chief Complaint Chief Complaint  Patient presents with  . Otalgia    HPI Samantha Hunter is a 18 y.o. adult.     Patient presents to the Ed with a chief complaint of left ear pain.  She was swimming this week and developed an ear infection.  Was seen at an urgent care and was prescribed ciprodex, but wasn't able to fill it due to insurance. She denies any fever.  She states that the ear has been draining.  She also reports referred pain to her jaw.  The history is provided by the patient. No language interpreter was used.    Past Medical History:  Diagnosis Date  . Allergy   . Anemia   . Anxiety   . Asthma   . Cannabis use disorder, mild, abuse 03/16/2017  . Depression   . Gender dysphoria 05/12/2017  . GERD (gastroesophageal reflux disease)   . Non-suicidal self harm 03/16/2017  . OCD (obsessive compulsive disorder) 03/16/2017  . Suicidal ideation 03/16/2017    Patient Active Problem List   Diagnosis Date Noted  . Suspected Covid-19 Virus Infection 05/14/2019  . Sorethroat 01/01/2019  . Acute pharyngitis 01/01/2019  . Viral illness 01/01/2019  . Odynophagia 03/23/2018  . Depression 03/23/2018  . Anxiety 03/23/2018  . Asthma 03/23/2018  . GERD (gastroesophageal reflux disease) 03/23/2018  . Gender identity: Female identifies as female 03/23/2018  . Narrowing of airway   . 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 Long-term current use of testosterone cypionate 02/08/2018  . MDD (major depressive disorder), recurrent episode (HCC) 06/01/2017  . Gender dysphoria 05/12/2017  . 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 Cannabis use disorder, mild, abuse 03/16/2017  . OCD (obsessive compulsive disorder) 03/16/2017  . Suicidal ideation 03/16/2017  . Non-suicidal self harm 03/16/2017  .  MDD (major depressive disorder), recurrent severe, without psychosis (HCC) 03/15/2017    Past Surgical History:  Procedure Laterality Date  . wisdom tooth removal Bilateral      OB History   No obstetric history on file.      Home Medications    Prior to Admission medications   Medication Sig Start Date End Date Taking? Authorizing Provider  albuterol (PROVENTIL HFA;VENTOLIN HFA) 108 (90 Base) MCG/ACT inhaler Inhale 2 puffs into the lungs every 4 (four) hours as needed for wheezing or shortness of breath (cough, shortness of breath or wheezing.). 12/06/18   Sherren MochaShaw, Eva N, MD  albuterol (PROVENTIL) (2.5 MG/3ML) 0.083% nebulizer solution TAKE 3 MLS BY NEBULIZATION EVERY 6 (SIX) HOURS AS NEEDED FOR WHEEZING OR SHORTNESS OF BREATH 02/11/19   Sherren MochaShaw, Eva N, MD  azithromycin [facility](ZITHROMAX) 250 MG tablet Sig as indicated 01/03/19   Georgina QuintSagardia, Miguel Jose, MD  budesonide-formoterol [facility](SYMBICORT) 160-4.5 MCG/ACT inhaler Inhale 2 puffs into the lungs 2 (two) times daily. 12/06/18   Sherren MochaShaw, Eva N, MD  cephALEXin (KEFLEX) 500 MG capsule Take 2 capsules (1,000 mg total) by mouth 2 (two) times daily. Patient not taking: Reported on 05/14/2019 02/27/19   Charlestine NightLawyer, Christopher, PA-C  IBUPROFEN PO Take by mouth as needed.    [provider]  NEEDLE, DISP, 18 G 18G X 1-1/2" MISC 1 Units by Does not apply route as needed. 12/06/18   Sherren MochaShaw, Eva N, MD  NEEDLE, DISP, 23 G 23G X 3/4" MISC 1 Units by Does not  apply route as needed. 12/06/18   Shawnee Knapp, MD  Syringe, Disposable, 1 ML MISC 1 Units by Does not apply route as needed. 07/01/18   Shawnee Knapp, MD  testosterone cypionate (DEPOTESTOSTERONE CYPIONATE) 200 MG/ML injection Inject 0.7 mLs (140 mg total) into the muscle every 14 (fourteen) days. 12/08/18   Shawnee Knapp, MD    Family History Family History  Problem Relation Age of Onset  . Mental illness Mother   . Alcohol abuse Mother   . Drug abuse Mother   . Depression Mother   . Hyperlipidemia Mother   .  Mental illness Father   . Alcohol abuse Father   . Drug abuse Father   . Depression Father   . Cancer Father        skin  . Heart disease Father   . Hyperlipidemia Father   . Stroke Father   . Mental illness Brother   . Anxiety disorder Brother   . Depression Brother   . Anxiety disorder Sister   . Depression Sister   . Mental illness Sister   . Alcohol abuse Maternal Uncle   . Drug abuse Maternal Uncle   . Alcohol abuse Maternal Grandfather   . Mental illness Brother   . Mental illness Sister     Social History Social History   Tobacco Use  . Smoking status: Former Smoker    Packs/day: 0.25    Years: 1.00    Pack years: 0.25    Types: Cigarettes    Quit date: 02/08/2018    Years since quitting: 1.4  . Smokeless tobacco: Former Systems developer    Types: Chew  Substance Use Topics  . Alcohol use: No  . Drug use: Yes    Frequency: 14.0 times per week    Types: Marijuana     Allergies   Pineapple   Review of Systems Review of Systems  All other systems reviewed and are negative.    Physical Exam Updated Vital Signs BP 129/85 (BP Location: Left Arm)   Pulse (!) 105   Temp 99 F (37.2 C) (Oral)   Resp 18   SpO2 97%   Physical Exam Vitals signs and nursing note reviewed.  Constitutional:      General: She is not in acute distress.    Appearance: She is well-developed.  HENT:     Head: Normocephalic and atraumatic.     Ears:     Comments: Significant swelling of the left ear canal, unable to visualize the TM, consistent with otitis externa Eyes:     Conjunctiva/sclera: Conjunctivae normal.  Neck:     Musculoskeletal: Neck supple.  Cardiovascular:     Rate and Rhythm: Normal rate.     Heart sounds: No murmur.  Pulmonary:     Effort: Pulmonary effort is normal. No respiratory distress.  Abdominal:     General: There is no distension.  Musculoskeletal: Normal range of motion.  Skin:    General: Skin is warm and dry.  Neurological:     Mental Status: She  is alert and oriented to person, place, and time.  Psychiatric:        Mood and Affect: Mood normal.        Behavior: Behavior normal.      ED Treatments / Results  Labs (all labs ordered are listed, but only abnormal results are displayed) Labs Reviewed - No data to display  EKG None  Radiology No results found.  Procedures Procedures (including critical care  time)  Medications Ordered in ED Medications  ciprofloxacin-dexamethasone (CIPRODEX) 0.3-0.1 % OTIC (EAR) suspension 4 drop (has no administration in time range)  acetaminophen (TYLENOL) tablet 1,000 mg (has no administration in time range)     Initial Impression / Assessment and Plan / ED Course  I have reviewed the triage vital signs and the nursing notes.  Pertinent labs & imaging results that were available during my care of the patient were reviewed by me and considered in my medical decision making (see chart for details).        Patient with left sided otitis externa.  Couldn't get the ciprodex prescribed by urgent care.  Ear wick placed.  Ciprodex given and sent home with patient.  Will also cover with oral abx due to inability to visualize TM.    Final Clinical Impressions(s) / ED Diagnoses   Final diagnoses:  Acute otitis externa of left ear, unspecified type    ED Discharge Orders         Ordered    amoxicillin-clavulanate (AUGMENTIN) 875-125 MG tablet  Every 12 hours     07/11/19 0136           Roxy HorsemanBrowning, Madeline Bebout, PA-C 07/11/19 0136    Zadie RhineWickline, Donald, MD 07/11/19 [phone]

## 2019-07-11 NOTE — Discharge Instructions (Addendum)
Instill 3-4 drops in ear morning and evening.

## 2019-09-09 ENCOUNTER — Telehealth: Payer: Self-pay

## 2019-09-09 NOTE — Telephone Encounter (Signed)
NOTES ON FILE FROM [facility] [phone], REFERRAL SENT TO SCHEDULING

## 2019-10-24 ENCOUNTER — Ambulatory Visit: Payer: Self-pay | Admitting: Cardiology

## 2019-10-30 ENCOUNTER — Encounter: Payer: Self-pay | Admitting: General Practice

## 2020-04-01 ENCOUNTER — Emergency Department (HOSPITAL_COMMUNITY)
Admission: EM | Admit: 2020-04-01 | Discharge: 2020-04-02 | Disposition: A | Discharge status: Home / Self Care | Payer: Self-pay | Attending: Emergency Medicine | Admitting: Emergency Medicine

## 2020-04-01 ENCOUNTER — Other Ambulatory Visit: Payer: Self-pay

## 2020-04-01 DIAGNOSIS — L03012 Cellulitis of left finger: Secondary | ICD-10-CM | POA: Diagnosis not present

## 2020-04-01 DIAGNOSIS — M79645 Pain in left finger(s): Secondary | ICD-10-CM | POA: Diagnosis present

## 2020-04-01 NOTE — ED Triage Notes (Signed)
Left third finger swelling and redness at the base of the nail for 2 days, has soaked in Epson salt and peroxide, no relief.

## 2020-04-02 MED ORDER — LIDOCAINE-EPINEPHRINE-TETRACAINE (LET) SOLUTION
3.0000 mL | Freq: Once | NASAL | Status: AC
  Administered 2020-04-02: 3 mL via TOPICAL
  Filled 2020-04-02: qty 3

## 2020-04-02 MED ORDER — LIDOCAINE HCL (PF) 1 % IJ SOLN
5.0000 mL | Freq: Once | INTRAMUSCULAR | Status: AC
  Administered 2020-04-02: 5 mL
  Filled 2020-04-02: qty 5

## 2020-04-02 MED ORDER — CEPHALEXIN 500 MG PO CAPS: 500.0000 mg | ORAL_CAPSULE | Freq: Four times a day (QID) | ORAL | 0 refills | Status: DC

## 2020-04-02 MED ORDER — CEPHALEXIN 250 MG PO CAPS
500.0000 mg | ORAL_CAPSULE | Freq: Once | ORAL | Status: AC
  Administered 2020-04-02: 500 mg via ORAL

## 2020-04-02 NOTE — ED Provider Notes (Signed)
[facility] EMERGENCY DEPARTMENT Provider Note   CSN: [id number] Arrival date & time: 04/01/20  2255     History Chief Complaint  Patient presents with  . Hand Pain    Samantha Hunter is a 19 y.o. adult with presents to the Emergency Department complaining of gradual, persistent, progressively worsening swelling and pain to the left middle finger onset several days ago. Associated symptoms include swelling, redness and pain.  Patient reports they do find their nails have had a paronychia before.  Reports soaking finger in Epson salt without relief.  Palpation make the pain worse, nothing makes it better.  The history is provided by the patient and medical records. No language interpreter was used.       Past Medical History:  Diagnosis Date  . Allergy   . Anemia   . Anxiety   . Asthma   . Cannabis use disorder, mild, abuse 03/16/2017  . Depression   . Gender dysphoria 05/12/2017  . GERD (gastroesophageal reflux disease)   . Non-suicidal self harm 03/16/2017  . OCD (obsessive compulsive disorder) 03/16/2017  . Suicidal ideation 03/16/2017    Patient Active Problem List   Diagnosis Date Noted  . Suspected COVID-19 virus infection 05/14/2019  . Sorethroat 01/01/2019  . Acute pharyngitis 01/01/2019  . Viral illness 01/01/2019  . Odynophagia 03/23/2018  . Depression 03/23/2018  . Anxiety 03/23/2018  . Asthma 03/23/2018  . GERD (gastroesophageal reflux disease) 03/23/2018  . Gender identity: Female identifies as female 03/23/2018  . Narrowing of airway   . 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 Long-term current use of testosterone cypionate 02/08/2018  . MDD (major depressive disorder), recurrent episode (HCC) 06/01/2017  . Gender dysphoria 05/12/2017  . 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 Cannabis use disorder, mild, abuse 03/16/2017  . OCD (obsessive compulsive disorder) 03/16/2017  . Suicidal  ideation 03/16/2017  . Non-suicidal self harm 03/16/2017  . MDD (major depressive disorder), recurrent severe, without psychosis (HCC) 03/15/2017    Past Surgical History:  Procedure Laterality Date  . wisdom tooth removal Bilateral      OB History   No obstetric history on file.     Family History  Problem Relation Age of Onset  . Mental illness Mother   . Alcohol abuse Mother   . Drug abuse Mother   . Depression Mother   . Hyperlipidemia Mother   . Mental illness Father   . Alcohol abuse Father   . Drug abuse Father   . Depression Father   . Cancer Father        skin  . Heart disease Father   . Hyperlipidemia Father   . Stroke Father   . Mental illness Brother   . Anxiety disorder Brother   . Depression Brother   . Anxiety disorder Sister   . Depression Sister   . Mental illness Sister   . Alcohol abuse Maternal Uncle   . Drug abuse Maternal Uncle   . Alcohol abuse Maternal Grandfather   . Mental illness Brother   . Mental illness Sister     Social History   Tobacco Use  . Smoking status: Former Smoker    Packs/day: 0.25    Years: 1.00    Pack years: 0.25    Types: Cigarettes    Quit date: 02/08/2018    Years since quitting: 2.1  . Smokeless tobacco: Former Neurosurgeon    Types: Chew  Substance Use Topics  . Alcohol  use: No  . Drug use: Yes    Frequency: 14.0 times per week    Types: Marijuana    Home Medications Prior to Admission medications   Medication Sig Start Date End Date Taking? Authorizing Provider  albuterol (PROVENTIL HFA;VENTOLIN HFA) 108 (90 Base) MCG/ACT inhaler Inhale 2 puffs into the lungs every 4 (four) hours as needed for wheezing or shortness of breath (cough, shortness of breath or wheezing.). 12/06/18   Sherren Mocha, MD  albuterol (PROVENTIL) (2.5 MG/3ML) 0.083% nebulizer solution TAKE 3 MLS BY NEBULIZATION EVERY 6 (SIX) HOURS AS NEEDED FOR WHEEZING OR SHORTNESS OF BREATH 02/11/19   Sherren Mocha, MD  amoxicillin-clavulanate (AUGMENTIN)  875-125 MG tablet Take 1 tablet by mouth every 12 (twelve) hours. 07/11/19   Roxy Horseman, PA-C  azithromycin (ZITHROMAX) 250 MG tablet Sig as indicated 01/03/19   Georgina Quint, MD  budesonide-formoterol [facility]) 160-4.5 MCG/ACT inhaler Inhale 2 puffs into the lungs 2 (two) times daily. 12/06/18   Sherren Mocha, MD  cephALEXin (KEFLEX) 500 MG capsule Take 1 capsule (500 mg total) by mouth 4 (four) times daily. 04/02/20   Jayshaun Phillips, Dahlia Client, PA-C  IBUPROFEN PO Take by mouth as needed.    [provider]  NEEDLE, DISP, 18 G 18G X 1-1/2" MISC 1 Units by Does not apply route as needed. 12/06/18   Sherren Mocha, MD  NEEDLE, DISP, 23 G 23G X 3/4" MISC 1 Units by Does not apply route as needed. 12/06/18   Sherren Mocha, MD  Syringe, Disposable, 1 ML MISC 1 Units by Does not apply route as needed. 07/01/18   Sherren Mocha, MD  testosterone cypionate (DEPOTESTOSTERONE CYPIONATE) 200 MG/ML injection Inject 0.7 mLs (140 mg total) into the muscle every 14 (fourteen) days. 12/08/18   Sherren Mocha, MD    Allergies    Pineapple  Review of Systems   Review of Systems  Constitutional: Negative for chills and fever.  Gastrointestinal: Negative for nausea and vomiting.  Endocrine: Negative for polydipsia, polyphagia and polyuria.  Skin: Positive for color change.  Allergic/Immunologic: Negative for immunocompromised state.  Hematological: Does not bruise/bleed easily.  Psychiatric/Behavioral: The patient is not nervous/anxious.     Physical Exam Updated Vital Signs BP 129/78 (BP Location: Left Arm)   Pulse (!) 128   Temp 97.8 F (36.6 C) (Oral)   Resp 18   SpO2 99%   Physical Exam Vitals and nursing note reviewed.  Constitutional:      Comments: No acute distress, well-developed  HENT:     Head: Normocephalic.  Eyes:     General: No scleral icterus.    Conjunctiva/sclera: Conjunctivae normal.  Cardiovascular:     Rate and Rhythm: Tachycardia present.  Pulmonary:     Effort:  Pulmonary effort is normal.  Musculoskeletal:        General: Normal range of motion.     Cervical back: Normal range of motion.     Comments: Full range of motion of all fingers of the left hand.  Skin:    General: Skin is warm and dry.     Capillary Refill: Capillary refill takes less than 2 seconds.     Findings: Abscess present.     Comments: Paronychia of the left middle finger -swelling and erythema.  No drainage.  Neurological:     General: No focal deficit present.     Comments: Alert  Psychiatric:        Mood and Affect: Mood normal.  ED Results / Procedures / Treatments    Procedures .Marland KitchenIncision and Drainage  Date/Time: 04/02/2020 12:42 AM Performed by: Abigail Butts, PA-C Authorized by: Abigail Butts, PA-C   Consent:    Consent obtained:  Verbal   Consent given by:  Patient   Risks discussed:  Bleeding, incomplete drainage, pain and damage to other organs   Alternatives discussed:  No treatment Universal protocol:    Procedure explained and questions answered to patient or proxy's satisfaction: yes     Relevant documents present and verified: yes     Test results available and properly labeled: yes     Imaging studies available: yes     Required blood products, implants, devices, and special equipment available: yes     Site/side marked: yes     Immediately prior to procedure a time out was called: yes     Patient identity confirmed:  Verbally with patient Location:    Type:  Abscess Pre-procedure details:    Skin preparation:  Chloraprep Anesthesia (see MAR for exact dosages):    Anesthesia method:  Nerve block and topical application   Topical anesthetic:  LET   Block needle gauge:  27 G   Block anesthetic:  Lidocaine 1% w/o epi   Block injection procedure:  Anatomic landmarks identified, introduced needle, incremental injection, anatomic landmarks palpated and negative aspiration for blood   Block outcome:  Anesthesia achieved Procedure  type:    Complexity:  Complex Procedure details:    Incision types:  Single straight   Incision depth:  Subcutaneous   Scalpel blade:  11   Wound management:  Probed and deloculated, irrigated with saline and extensive cleaning   Drainage:  Purulent   Drainage amount:  Moderate   Wound treatment:  Wound left open Post-procedure details:    Patient tolerance of procedure:  Tolerated well, no immediate complications   (including critical care time)  Medications Ordered in ED Medications  lidocaine-EPINEPHrine-tetracaine (LET) solution (has no administration in time range)  lidocaine (PF) (XYLOCAINE) 1 % injection 5 mL (5 mLs Infiltration Given 04/02/20 0013)    ED Course  I have reviewed the triage vital signs and the nursing notes.  Pertinent labs & imaging results that were available during my care of the patient were reviewed by me and considered in my medical decision making (see chart for details).    MDM Rules/Calculators/A&P                       Presents with paronychia of the left middle finger.  Likely from nail biting.  I&D in the emergency department with copious amounts of purulent drainage.  Patient discharged home with Keflex and instructions for warm soaks.  Patient states understanding and is in agreement with the plan.  BP 122/72 (BP Location: Right Arm)   Pulse (!) 106   Temp 97.8 F (36.6 C) (Oral)   Resp 16   SpO2 99%    Final Clinical Impression(s) / ED Diagnoses Final diagnoses:  Paronychia of finger of left hand    Rx / DC Orders ED Discharge Orders         Ordered    cephALEXin (KEFLEX) 500 MG capsule  4 times daily     04/02/20 0042           Ceilidh Torregrossa, Jarrett Soho, PA-C 04/02/20 0601    Cardama, Grayce Sessions, MD 04/02/20 [phone]

## 2020-04-02 NOTE — ED Notes (Signed)
Patient verbalizes understanding of discharge instructions. Opportunity for questioning and answers were provided. Armband removed by staff, pt discharged from ED ambulatory to home.  

## 2020-04-02 NOTE — Discharge Instructions (Addendum)
1. Medications: Keflex, usual home medications 2. Treatment: rest, drink plenty of fluids, warm soaks 2-3x per day, keep wound clean with warm soap and water 3. Follow Up: Please followup with your primary doctor in 2-3 days for wound check; Please return to the ER for persistent swelling, spreading infection, worsening pain or other concerns.

## 2020-04-12 ENCOUNTER — Telehealth: Payer: Self-pay

## 2020-04-12 NOTE — Telephone Encounter (Signed)
NOTES ON FILE FROM DR EVA Clelia Croft [phone], SENT REFERRAL TO SCHEDULING

## 2020-04-21 ENCOUNTER — Encounter: Payer: Self-pay | Admitting: General Practice

## 2021-04-26 ENCOUNTER — Other Ambulatory Visit: Payer: Self-pay | Admitting: Family Medicine

## 2021-04-26 ENCOUNTER — Other Ambulatory Visit: Payer: Self-pay

## 2021-04-26 ENCOUNTER — Ambulatory Visit (INDEPENDENT_AMBULATORY_CARE_PROVIDER_SITE_OTHER): Payer: 59

## 2021-04-26 ENCOUNTER — Other Ambulatory Visit: Payer: 59

## 2021-04-26 DIAGNOSIS — K8500 Idiopathic acute pancreatitis without necrosis or infection: Secondary | ICD-10-CM

## 2021-04-26 DIAGNOSIS — K921 Melena: Secondary | ICD-10-CM

## 2021-04-26 DIAGNOSIS — R748 Abnormal levels of other serum enzymes: Secondary | ICD-10-CM

## 2021-04-26 DIAGNOSIS — R109 Unspecified abdominal pain: Secondary | ICD-10-CM

## 2021-04-26 DIAGNOSIS — R1110 Vomiting, unspecified: Secondary | ICD-10-CM | POA: Diagnosis not present

## 2021-04-26 MED ORDER — IOHEXOL 300 MG/ML  SOLN
100.0000 mL | Freq: Once | INTRAMUSCULAR | Status: AC | PRN
  Administered 2021-04-26: 100 mL via INTRAVENOUS

## 2021-09-10 ENCOUNTER — Other Ambulatory Visit: Payer: Self-pay

## 2021-09-10 ENCOUNTER — Encounter (HOSPITAL_COMMUNITY): Payer: Self-pay | Admitting: Emergency Medicine

## 2021-09-10 ENCOUNTER — Emergency Department (HOSPITAL_COMMUNITY): Payer: 59

## 2021-09-10 ENCOUNTER — Emergency Department (HOSPITAL_COMMUNITY)
Admission: EM | Admit: 2021-09-10 | Discharge: 2021-09-10 | Disposition: A | Discharge status: Home / Self Care | Payer: 59 | Attending: Emergency Medicine | Admitting: Emergency Medicine

## 2021-09-10 DIAGNOSIS — R112 Nausea with vomiting, unspecified: Secondary | ICD-10-CM | POA: Insufficient documentation

## 2021-09-10 DIAGNOSIS — J45909 Unspecified asthma, uncomplicated: Secondary | ICD-10-CM | POA: Insufficient documentation

## 2021-09-10 DIAGNOSIS — W228XXA Striking against or struck by other objects, initial encounter: Secondary | ICD-10-CM | POA: Insufficient documentation

## 2021-09-10 DIAGNOSIS — S060X0A Concussion without loss of consciousness, initial encounter: Secondary | ICD-10-CM | POA: Insufficient documentation

## 2021-09-10 DIAGNOSIS — F17210 Nicotine dependence, cigarettes, uncomplicated: Secondary | ICD-10-CM | POA: Insufficient documentation

## 2021-09-10 DIAGNOSIS — Z7951 Long term (current) use of inhaled steroids: Secondary | ICD-10-CM | POA: Insufficient documentation

## 2021-09-10 MED ORDER — ONDANSETRON 4 MG PO TBDP
4.0000 mg | ORAL_TABLET | Freq: Once | ORAL | Status: AC
  Administered 2021-09-10: 4 mg via ORAL
  Filled 2021-09-10: qty 1

## 2021-09-10 MED ORDER — ONDANSETRON 4 MG PO TBDP: 4.0000 mg | ORAL_TABLET | Freq: Three times a day (TID) | ORAL | 0 refills | Status: DC | PRN

## 2021-09-10 MED ORDER — KETOROLAC TROMETHAMINE 30 MG/ML IJ SOLN
30.0000 mg | Freq: Once | INTRAMUSCULAR | Status: AC
  Administered 2021-09-10: 30 mg via INTRAMUSCULAR
  Filled 2021-09-10: qty 1

## 2021-09-10 NOTE — ED Provider Notes (Addendum)
Emergency Medicine Provider Triage Evaluation Note  Samantha Hunter , a 20 y.o. adult  was evaluated in triage.  Pt complains of nausea and vomiting after head injury that occurred yesterday.  Patient states that he accidentally opened the car door and struck himself in the face.  Since the incident he has vomited 6 times and reports associated confusion and generalized altered mental status.  He denies any weakness/numbness to any of his upper or lower extremities.  Has pain over the right eyebrow which he rates moderate in severity. Patient identifies as female.   Positive:  Negative: See above  Physical Exam  BP 135/86 (BP Location: Left Arm)   Pulse 95   Temp 97.9 F (36.6 C) (Oral)   Resp 16   Ht 5\' 5"  (1.651 m)   Wt 117.9 kg   SpO2 97%   BMI 43.27 kg/m  Gen:   Awake, no distress   Resp:  Normal effort  MSK:   Moves extremities without difficulty  Other:  Cranial nerves II through XII are intact.  Process 5 strength to the upper and lower extremities.  Normal sensation to the upper and lower extremities.  There is mild ecchymosis over the right eyebrow.  Patient is alert and oriented x4.  No evidence of battle sign or raccoon eyes at this time.  Answers all questions appropriately.  Medical Decision Making  Medically screening exam initiated at 9:55 AM.  Appropriate orders placed.  Samantha Hunter was informed that the remainder of the evaluation will be completed by another provider, this initial triage assessment does not replace that evaluation, and the importance of remaining in the ED until their evaluation is complete.  CT head ordered per Felecia Jan CT head trauma rules.     [place], PA-C 09/10/21 0959    11/10/21, PA-C 09/10/21 1000    11/10/21, MD 09/10/21 [phone]

## 2021-09-10 NOTE — Discharge Instructions (Signed)
Please read and follow all provided instructions.  Your diagnoses today include:  1. Concussion without loss of consciousness, initial encounter   2. Nausea and vomiting, unspecified vomiting type     Tests performed today include: CT scan of your head that did not show any serious injury. Vital signs. See below for your results today.   Medications prescribed:  Zofran (ondansetron) - for nausea and vomiting  Take any prescribed medications only as directed.  Home care instructions:  Follow any educational materials contained in this packet.  BE VERY CAREFUL not to take multiple medicines containing Tylenol (also called acetaminophen). Doing so can lead to an overdose which can damage your liver and cause liver failure and possibly death.   Follow-up instructions: Please follow-up with your primary care provider or the concussion clinic listed in the next 3 days for further evaluation of your symptoms if not improving.  Return instructions:  SEEK IMMEDIATE MEDICAL ATTENTION IF: There is confusion or drowsiness (although children frequently become drowsy after injury).  You cannot awaken the injured person.  You have more than one episode of vomiting.  You notice dizziness or unsteadiness which is getting worse, or inability to walk.  You have convulsions or unconsciousness.  You experience severe, persistent headaches not relieved by Tylenol. You cannot use arms or legs normally.  There are changes in pupil sizes. (This is the black center in the colored part of the eye)  There is clear or bloody discharge from the nose or ears.  You have change in speech, vision, swallowing, or understanding.  Localized weakness, numbness, tingling, or change in bowel or bladder control. You have any other emergent concerns.  Additional Information: You have had a head injury which does not appear to require admission at this time.  Your vital signs today were: BP 115/83   Pulse 72    Temp 97.9 F (36.6 C) (Oral)   Resp (!) 28   Ht 5\' 5"  (1.651 m)   Wt 117.9 kg   SpO2 95%   BMI 43.27 kg/m  If your blood pressure (BP) was elevated above 135/85 this visit, please have this repeated by your doctor within one month. --------------

## 2021-09-10 NOTE — ED Notes (Signed)
DC instructions reviewed with pt. PT verbalized understanding. PT DC °

## 2021-09-10 NOTE — ED Triage Notes (Signed)
Hit R side of forehead on car door yesterday.  C/o head pain, nausea, and vomiting.

## 2021-09-10 NOTE — ED Provider Notes (Signed)
[facility] EMERGENCY DEPARTMENT Provider Note   CSN: [id number] Arrival date & time: 09/10/21  0940     History Chief Complaint  Patient presents with   Concussion    Samantha Hunter is a 20 y.o. adult.  Patient is a 20 year old transgender female who presents to the emergency department today for vomiting after head injury sustained yesterday.  He states that he was opening up a car door and struck his head above his right eyebrow.  He states that he had vomiting immediately and that this vomiting has been persistent overnight into this morning.  Patient reports headache, light sensitivity.  No neck pain, weakness, numbness, or tingling in the arms or the legs.  No treatments prior to arrival.  No jaw pain.  Pain is currently 7 out of 10.  Onset of symptoms acute.  Course is constant.  Nothing make symptoms better.      Past Medical History:  Diagnosis Date   Allergy    Anemia    Anxiety    Asthma    Cannabis use disorder, mild, abuse 03/16/2017   Depression    Gender dysphoria 05/12/2017   GERD (gastroesophageal reflux disease)    Non-suicidal self harm 03/16/2017   OCD (obsessive compulsive disorder) 03/16/2017   Suicidal ideation 03/16/2017    Patient Active Problem List   Diagnosis Date Noted   Suspected COVID-19 virus infection 05/14/2019   Sorethroat 01/01/2019   Acute pharyngitis 01/01/2019   Viral illness 01/01/2019   Odynophagia 03/23/2018   Depression 03/23/2018   Anxiety 03/23/2018   Asthma 03/23/2018   GERD (gastroesophageal reflux disease) 03/23/2018   Gender identity: Female identifies as female 03/23/2018   Narrowing of airway    Female-to-female transgender person 02/08/2018   Long-term current use of testosterone cypionate 02/08/2018   MDD (major depressive disorder), recurrent episode (HCC) 06/01/2017   Gender dysphoria 05/12/2017   Major depressive disorder, 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     Family History  Problem Relation Age of Onset   Mental illness Mother    Alcohol abuse Mother    Drug abuse Mother    Depression Mother    Hyperlipidemia Mother    Mental illness Father    Alcohol abuse Father    Drug abuse Father    Depression Father    Cancer Father        skin   Heart disease Father    Hyperlipidemia Father    Stroke Father    Mental illness Brother    Anxiety disorder Brother    Depression Brother    Anxiety disorder Sister    Depression Sister    Mental illness Sister    Alcohol abuse Maternal Uncle    Drug abuse Maternal Uncle    Alcohol abuse Maternal Grandfather    Mental illness Brother    Mental illness Sister     Social History   Tobacco Use   Smoking status: Every Day    Packs/day: 0.25    Years: 1.00    Pack years: 0.25    Types: Cigarettes  Last attempt to quit: 02/08/2018    Years since quitting: 3.5   Smokeless tobacco: Former    Types: Associate Professor Use: Every day  Substance Use Topics   Alcohol use: Yes   Drug use: Yes    Frequency: 14.0 times per week    Types: Marijuana, Cocaine    Comment: "Coke, weed, and xanax"    Home Medications Prior to Admission medications   Medication Sig Start Date End Date Taking? Authorizing Provider  albuterol (PROVENTIL HFA;VENTOLIN HFA) 108 (90 Base) MCG/ACT inhaler Inhale 2 puffs into the lungs every 4 (four) hours as needed for wheezing or shortness of breath (cough, shortness of breath or wheezing.). 12/06/18   Sherren Mocha, MD  albuterol (PROVENTIL) (2.5 MG/3ML) 0.083% nebulizer solution TAKE 3  MLS BY NEBULIZATION EVERY 6 (SIX) HOURS AS NEEDED FOR WHEEZING OR SHORTNESS OF BREATH 02/11/19   Sherren Mocha, MD  amoxicillin-clavulanate (AUGMENTIN) 875-125 MG tablet Take 1 tablet by mouth every 12 (twelve) hours. 07/11/19   Roxy Horseman, PA-C  azithromycin (ZITHROMAX) 250 MG tablet Sig as indicated 01/03/19   Georgina Quint, MD  budesonide-formoterol [facility]) 160-4.5 MCG/ACT inhaler Inhale 2 puffs into the lungs 2 (two) times daily. 12/06/18   Sherren Mocha, MD  cephALEXin (KEFLEX) 500 MG capsule Take 1 capsule (500 mg total) by mouth 4 (four) times daily. 04/02/20   Muthersbaugh, Dahlia Client, PA-C  IBUPROFEN PO Take by mouth as needed.    [provider]  NEEDLE, DISP, 18 G 18G X 1-1/2" MISC 1 Units by Does not apply route as needed. 12/06/18   Sherren Mocha, MD  NEEDLE, DISP, 23 G 23G X 3/4" MISC 1 Units by Does not apply route as needed. 12/06/18   Sherren Mocha, MD  Syringe, Disposable, 1 ML MISC 1 Units by Does not apply route as needed. 07/01/18   Sherren Mocha, MD  testosterone cypionate (DEPOTESTOSTERONE CYPIONATE) 200 MG/ML injection Inject 0.7 mLs (140 mg total) into the muscle every 14 (fourteen) days. 12/08/18   Sherren Mocha, MD    Allergies    Pineapple  Review of Systems   Review of Systems  Constitutional:  Negative for fatigue.  HENT:  Negative for tinnitus.   Eyes:  Positive for photophobia and visual disturbance (blurry, no loss of vision). Negative for pain.  Respiratory:  Negative for shortness of breath.   Cardiovascular:  Negative for chest pain.  Gastrointestinal:  Positive for nausea and vomiting.  Musculoskeletal:  Negative for back pain, gait problem and neck pain.  Skin:  Negative for wound.  Neurological:  Positive for headaches. Negative for dizziness, weakness, light-headedness and numbness.  Psychiatric/Behavioral:  Negative for confusion and decreased concentration.    Physical Exam Updated Vital Signs BP 135/86 (BP Location: Left Arm)   Pulse 95    Temp 97.9 F (36.6 C) (Oral)   Resp 16   Ht 5\' 5"  (1.651 m)   Wt 117.9 kg   SpO2 97%   BMI 43.27 kg/m   Physical Exam Vitals and nursing note reviewed.  Constitutional:      Appearance: She is well-developed.  HENT:     Head: Normocephalic. No raccoon eyes or Battle's sign.     Comments: Minimal abrasion noted just above R eyebrow.     Right Ear: Tympanic membrane, ear canal and external ear normal. No hemotympanum.     Left Ear: Tympanic membrane, ear canal and external ear normal. No hemotympanum.  Nose: Nose normal.  Eyes:     General: Lids are normal.     Conjunctiva/sclera: Conjunctivae normal.     Pupils: Pupils are equal, round, and reactive to light.     Comments: No visible hyphema  Cardiovascular:     Rate and Rhythm: Normal rate and regular rhythm.  Pulmonary:     Effort: Pulmonary effort is normal.     Breath sounds: Normal breath sounds.  Abdominal:     Palpations: Abdomen is soft.     Tenderness: There is no abdominal tenderness.  Musculoskeletal:        General: Normal range of motion.     Cervical back: Normal range of motion and neck supple. No tenderness or bony tenderness.     Thoracic back: No tenderness or bony tenderness.     Lumbar back: No tenderness or bony tenderness.  Skin:    General: Skin is warm and dry.  Neurological:     Mental Status: She is alert and oriented to person, place, and time.     GCS: GCS eye subscore is 4. GCS verbal subscore is 5. GCS motor subscore is 6.     Cranial Nerves: No cranial nerve deficit.     Sensory: No sensory deficit.     Coordination: Coordination normal.     Deep Tendon Reflexes: Reflexes are normal and symmetric.    ED Results / Procedures / Treatments   Labs (all labs ordered are listed, but only abnormal results are displayed) Labs Reviewed - No data to display  EKG None  Radiology CT Head Wo Contrast  Result Date: 09/10/2021 CLINICAL DATA:  Head trauma with repetitive vomiting EXAM: CT  HEAD WITHOUT CONTRAST TECHNIQUE: Contiguous axial images were obtained from the base of the skull through the vertex without intravenous contrast. COMPARISON:  03/06/2019 FINDINGS: Brain: No evidence of acute infarction, hemorrhage, hydrocephalus, extra-axial collection or mass lesion/mass effect. Vascular: No hyperdense vessel or unexpected calcification. Skull: Normal. Negative for fracture or focal lesion. Sinuses/Orbits: No acute finding. IMPRESSION: Negative head CT. Electronically Signed   By: Tiburcio Pea M.D.   On: 09/10/2021 10:53    Procedures Procedures   Medications Ordered in ED Medications  ondansetron (ZOFRAN-ODT) disintegrating tablet 4 mg (4 mg Oral Given 09/10/21 1120)  ketorolac (TORADOL) 30 MG/ML injection 30 mg (30 mg Intramuscular Given 09/10/21 1120)    ED Course  I have reviewed the triage vital signs and the nursing notes.  Pertinent labs & imaging results that were available during my care of the patient were reviewed by me and considered in my medical decision making (see chart for details).  Patient seen and examined.  Patient updated on CT imaging results.  We discussed signs and symptoms of a concussion, follow-up, precautions.  We will give IM Toradol for headache now that CT is back.  We will give ODT Zofran and fluid challenge.  Vital signs reviewed and are as follows: BP 135/86 (BP Location: Left Arm)   Pulse 95   Temp 97.9 F (36.6 C) (Oral)   Resp 16   Ht 5\' 5"  (1.651 m)   Wt 117.9 kg   SpO2 97%   BMI 43.27 kg/m   12:02 PM Patient rechecked.  He is drinking sips of water with some nausea but no vomiting.  Comfortable with discharge home.  Reiterated instructions.  Ready for discharge.    MDM Rules/Calculators/A&P  Patient with minor head injury, persistent vomiting concerning for concussion.  Head CT without signs of intracranial bleeding.  Otherwise normal neurologic exam.  Tolerating fluids after Zofran.  Ready for  discharge.  Follow-up as above.   Final Clinical Impression(s) / ED Diagnoses Final diagnoses:  Concussion without loss of consciousness, initial encounter  Nausea and vomiting, unspecified vomiting type    Rx / DC Orders ED Discharge Orders          Ordered    ondansetron (ZOFRAN ODT) 4 MG disintegrating tablet  Every 8 hours PRN        09/10/21 1141             Renne Crigler, PA-C 09/10/21 1203    Alvira Monday, MD 09/10/21 1640

## 2021-10-23 ENCOUNTER — Other Ambulatory Visit: Payer: Self-pay

## 2021-10-23 ENCOUNTER — Emergency Department (HOSPITAL_COMMUNITY)
Admission: EM | Admit: 2021-10-23 | Discharge: 2021-10-23 | Disposition: A | Discharge status: Home / Self Care | Payer: 59 | Attending: Medical | Admitting: Medical

## 2021-10-23 DIAGNOSIS — Z5321 Procedure and treatment not carried out due to patient leaving prior to being seen by health care provider: Secondary | ICD-10-CM | POA: Insufficient documentation

## 2021-10-23 DIAGNOSIS — J029 Acute pharyngitis, unspecified: Secondary | ICD-10-CM | POA: Insufficient documentation

## 2021-10-23 DIAGNOSIS — M791 Myalgia, unspecified site: Secondary | ICD-10-CM | POA: Diagnosis not present

## 2021-10-23 DIAGNOSIS — R059 Cough, unspecified: Secondary | ICD-10-CM | POA: Diagnosis not present

## 2021-10-23 DIAGNOSIS — Z20822 Contact with and (suspected) exposure to covid-19: Secondary | ICD-10-CM | POA: Diagnosis not present

## 2021-10-23 LAB — RESP PANEL BY RT-PCR (FLU A&B, COVID) ARPGX2
Influenza A by PCR: NEGATIVE
Influenza B by PCR: NEGATIVE
SARS Coronavirus 2 by RT PCR: NEGATIVE

## 2021-10-23 LAB — GROUP A STREP BY PCR: Group A Strep by PCR: NOT DETECTED

## 2021-10-23 NOTE — ED Triage Notes (Signed)
Pt c/o sore throat and a cough. Pt also reports body aches.

## 2021-10-23 NOTE — ED Notes (Signed)
Patient LWBS

## 2021-10-23 NOTE — ED Provider Notes (Signed)
Emergency Medicine Provider Triage Evaluation Note  Samantha Hunter , a 20 y.o. adult  was evaluated in triage.  Pt complains of sore throat that began 2 days ago.  Patient is also complaining of a cough, fevers, body aches.  Reports history of strep in the past requiring ICU admission.  Patient states that they have been able to swallow however it takes 2-3 times and with force to be able to do so.  They have been taking DayQuil, NyQuil, ibuprofen.    Review of Systems  Positive: + sore throat, cough, body aches, fever Negative: - SOB  Physical Exam  BP (!) 139/98 (BP Location: Right Arm)   Pulse (!) 105   Temp 98 F (36.7 C) (Oral)   Resp 17   Ht 5\' 5"  (1.651 m)   Wt 104.3 kg   SpO2 99%   BMI 38.27 kg/m  Gen:   Awake, no distress   Resp:  Normal effort. Coughing in room. Able to speak in full sentences without difficulty.  MSK:   Moves extremities without difficulty  Other:  Mild posterior oropharynx erythema. No edema. Uvula midline. Airway patent. Tolerating own secretions.   Medical Decision Making  Medically screening exam initiated at 7:55 PM.  Appropriate orders placed.  Emile Ringgenberg was informed that the remainder of the evaluation will be completed by another provider, this initial triage assessment does not replace that evaluation, and the importance of remaining in the ED until their evaluation is complete.     Felecia Jan, PA-C 10/23/21 1956    10/25/21, MD 10/24/21 [phone]

## 2021-10-26 ENCOUNTER — Other Ambulatory Visit: Payer: Self-pay | Admitting: Family Medicine

## 2021-10-26 ENCOUNTER — Other Ambulatory Visit: Payer: Self-pay

## 2021-10-26 ENCOUNTER — Ambulatory Visit
Admission: RE | Admit: 2021-10-26 | Discharge: 2021-10-26 | Disposition: A | Discharge status: Home / Self Care | Payer: 59 | Source: Ambulatory Visit | Attending: Family Medicine | Admitting: Family Medicine

## 2021-10-26 DIAGNOSIS — R042 Hemoptysis: Secondary | ICD-10-CM

## 2021-11-16 ENCOUNTER — Other Ambulatory Visit: Payer: Self-pay

## 2021-11-16 ENCOUNTER — Ambulatory Visit
Admission: RE | Admit: 2021-11-16 | Discharge: 2021-11-16 | Disposition: A | Discharge status: Home / Self Care | Payer: 59 | Source: Ambulatory Visit | Attending: Family Medicine | Admitting: Family Medicine

## 2021-11-16 ENCOUNTER — Other Ambulatory Visit: Payer: Self-pay | Admitting: Family Medicine

## 2021-11-16 DIAGNOSIS — R0989 Other specified symptoms and signs involving the circulatory and respiratory systems: Secondary | ICD-10-CM

## 2022-07-22 ENCOUNTER — Encounter (HOSPITAL_COMMUNITY): Payer: Self-pay | Admitting: *Deleted

## 2022-07-22 ENCOUNTER — Emergency Department (HOSPITAL_COMMUNITY)
Admission: EM | Admit: 2022-07-22 | Discharge: 2022-07-23 | Disposition: A | Discharge status: Home / Self Care | Payer: 59 | Attending: Emergency Medicine | Admitting: Emergency Medicine

## 2022-07-22 ENCOUNTER — Other Ambulatory Visit: Payer: Self-pay

## 2022-07-22 DIAGNOSIS — Z79899 Other long term (current) drug therapy: Secondary | ICD-10-CM | POA: Insufficient documentation

## 2022-07-22 DIAGNOSIS — L03311 Cellulitis of abdominal wall: Secondary | ICD-10-CM | POA: Insufficient documentation

## 2022-07-22 NOTE — ED Triage Notes (Signed)
Pt noticed area on the right side of the mid abdomen today "golf ball size lump" that is red and hurts. Denies fevers.

## 2022-07-22 NOTE — ED Provider Triage Note (Signed)
Emergency Medicine Provider Triage Evaluation Note  Samantha Hunter , a 21 y.o. adult  was evaluated in triage.  Pt complains of a lump on his lower right abdomen.  Noticed it a few hours ago, felt it was mildly painful and looked red.  Denies fever, recent tick bite or infection.  No recent antibiotic use.  Unsure whether he has had neck stiffness, believes it may be "in my head".  Review of Systems  Positive:  Negative: See above  Physical Exam  BP 124/86   Pulse 79   Temp 98.1 F (36.7 C) (Oral)   Resp 16   SpO2 99%  Gen:   Awake, no distress   Resp:  Normal effort  MSK:   Moves extremities without difficulty  Other:  1 to 2 cm possible abscess of the right abdominal wall.  Mild fluctuance appreciated.  No evidence of discharge.  Negative Kernig's and Brudzinski sign.  No midline cervical spine tenderness.  Medical Decision Making  Medically screening exam initiated at 8:32 PM.  Appropriate orders placed.  Samantha Hunter was informed that the remainder of the evaluation will be completed by another provider, this initial triage assessment does not replace that evaluation, and the importance of remaining in the ED until their evaluation is complete.     Cecil Cobbs, PA-C 07/22/22 2039

## 2022-07-23 MED ORDER — DOXYCYCLINE HYCLATE 100 MG PO TABS
100.0000 mg | ORAL_TABLET | Freq: Once | ORAL | Status: AC
  Administered 2022-07-23: 100 mg via ORAL
  Filled 2022-07-23: qty 1

## 2022-07-23 MED ORDER — OXYCODONE-ACETAMINOPHEN 5-325 MG PO TABS
1.0000 | ORAL_TABLET | Freq: Once | ORAL | Status: AC
  Administered 2022-07-23: 1 via ORAL
  Filled 2022-07-23: qty 1

## 2022-07-23 MED ORDER — DOXYCYCLINE HYCLATE 100 MG PO CAPS: 100.0000 mg | ORAL_CAPSULE | Freq: Two times a day (BID) | ORAL | 0 refills | Status: DC

## 2022-07-23 NOTE — ED Provider Notes (Signed)
[facility] EMERGENCY DEPARTMENT Provider Note   CSN: [id number] Arrival date & time: 07/22/22  1920     History  Chief Complaint  Patient presents with   Abscess    Samantha Hunter is a 21 y.o. adult.  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 There is also a firm mass he feels under the skin. No fevers, nausea, vomiting. Just noticed it today. Doesn't inject her testosterone there. No other known injuries. 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    Abscess      Home Medications Prior to Admission medications   Medication Sig Start Date End Date Taking? Authorizing Provider  doxycycline (VIBRAMYCIN) 100 MG capsule Take 1 capsule (100 mg total) by mouth 2 (two) times daily. One po bid x 7 days 07/23/22  Yes Stepehn Eckard, Barbara Cower, MD  albuterol (PROVENTIL HFA;VENTOLIN HFA) 108 (90 Base) MCG/ACT inhaler Inhale 2 puffs into the lungs every 4 (four) hours as needed for wheezing or shortness of breath (cough, shortness of breath or wheezing.). 12/06/18   Sherren Mocha, MD  albuterol (PROVENTIL) (2.5 MG/3ML) 0.083% nebulizer solution TAKE 3 MLS BY NEBULIZATION EVERY 6 (SIX) HOURS AS NEEDED FOR WHEEZING OR SHORTNESS OF BREATH 02/11/19   Sherren Mocha, MD  amoxicillin-clavulanate (AUGMENTIN) 875-125 MG tablet Take 1 tablet by mouth every 12 (twelve) hours. 07/11/19   Roxy Horseman, PA-C  azithromycin (ZITHROMAX) 250 MG tablet Sig as indicated 01/03/19   Georgina Quint, MD  budesonide-formoterol [facility]) 160-4.5 MCG/ACT inhaler Inhale 2 puffs into the lungs 2 (two) times daily. 12/06/18   Sherren Mocha, MD  cephALEXin (KEFLEX) 500 MG capsule Take 1 capsule (500 mg total) by mouth 4 (four) times daily. 04/02/20   Muthersbaugh, Dahlia Client, PA-C  IBUPROFEN PO Take by mouth as needed.    [provider]  NEEDLE, DISP, 18 G 18G X 1-1/2" MISC 1  Units by Does not apply route as needed. 12/06/18   Sherren Mocha, MD  NEEDLE, DISP, 23 G 23G X 3/4" MISC 1 Units by Does not apply route as needed. 12/06/18   Sherren Mocha, MD  ondansetron (ZOFRAN ODT) 4 MG disintegrating tablet Take 1 tablet (4 mg total) by mouth every 8 (eight) hours as needed for nausea or vomiting. 09/10/21   Renne Crigler, PA-C  Syringe, Disposable, 1 ML MISC 1 Units by Does not apply route as needed. 07/01/18   Sherren Mocha, MD  testosterone cypionate (DEPOTESTOSTERONE CYPIONATE) 200 MG/ML injection Inject 0.7 mLs (140 mg total) into the muscle every 14 (fourteen) days. 12/08/18   Sherren Mocha, MD      Allergies    Pineapple    Review of Systems   Review of Systems  Physical Exam Updated Vital Signs BP 108/74 (BP Location: Right Arm)   Pulse 76   Temp 97.9 F (36.6 C)   Resp 18   SpO2 100%  Physical Exam Vitals and nursing note reviewed.  Constitutional:      Appearance: He is well-developed.  HENT:     Head: Normocephalic and atraumatic.  Eyes:     Pupils: Pupils are equal, round, and reactive to light.  Cardiovascular:     Rate and Rhythm: Normal rate.  Pulmonary:     Effort: Pulmonary effort is normal. No respiratory distress.  Abdominal:     General: Abdomen  is flat. There is no distension.  Musculoskeletal:        General: Normal range of motion.     Cervical back: Normal range of motion.  Skin:    General: Skin is warm and dry.     Findings: Erythema (3x3 cm area of erythema to RLQ with firm approximately 1.0 cm area under it, skin indurated and tender) present.  Neurological:     General: No focal deficit present.     Mental Status: He is alert.     ED Results / Procedures / Treatments   Labs (all labs ordered are listed, but only abnormal results are displayed) Labs Reviewed - No data to display  EKG None  Radiology No results found.  Procedures Procedures    Medications Ordered in ED Medications  oxyCODONE-acetaminophen  (PERCOCET/ROXICET) 5-325 MG per tablet 1 tablet (1 tablet Oral Given 07/23/22 0356)  doxycycline (VIBRA-TABS) tablet 100 mg (100 mg Oral Given 07/23/22 0356)    ED Course/ Medical Decision Making/ A&P                           Medical Decision Making Risk Prescription drug management.   Quick BUS shows cobblestoning c/w cellulitis. Not able to appreciate the firm nodule no [place], may be a lymph node? But not an abscess. Will treat for mild cellulitis.   Final Clinical Impression(s) / ED Diagnoses Final diagnoses:  Cellulitis of abdominal wall    Rx / DC Orders ED Discharge Orders          Ordered    doxycycline (VIBRAMYCIN) 100 MG capsule  2 times daily        07/23/22 0322              Grant Swager, Barbara Cower, MD 07/23/22 [phone]

## 2022-07-23 NOTE — ED Notes (Signed)
Provider at bedside

## 2023-05-14 ENCOUNTER — Encounter (HOSPITAL_COMMUNITY): Payer: Self-pay

## 2023-05-14 ENCOUNTER — Emergency Department (HOSPITAL_COMMUNITY)
Admission: EM | Admit: 2023-05-14 | Discharge: 2023-05-15 | Discharge status: Against Medical Advice | Payer: BLUE CROSS/BLUE SHIELD | Attending: Emergency Medicine | Admitting: Emergency Medicine

## 2023-05-14 ENCOUNTER — Other Ambulatory Visit: Payer: Self-pay

## 2023-05-14 DIAGNOSIS — R1011 Right upper quadrant pain: Secondary | ICD-10-CM | POA: Insufficient documentation

## 2023-05-14 DIAGNOSIS — Z5321 Procedure and treatment not carried out due to patient leaving prior to being seen by health care provider: Secondary | ICD-10-CM | POA: Diagnosis not present

## 2023-05-14 DIAGNOSIS — R1012 Left upper quadrant pain: Secondary | ICD-10-CM | POA: Diagnosis not present

## 2023-05-14 DIAGNOSIS — R112 Nausea with vomiting, unspecified: Secondary | ICD-10-CM | POA: Insufficient documentation

## 2023-05-14 DIAGNOSIS — K921 Melena: Secondary | ICD-10-CM | POA: Insufficient documentation

## 2023-05-14 DIAGNOSIS — R197 Diarrhea, unspecified: Secondary | ICD-10-CM | POA: Diagnosis not present

## 2023-05-14 LAB — CBC WITH DIFFERENTIAL/PLATELET
Abs Immature Granulocytes: 0.02 10*3/uL (ref 0.00–0.07)
Basophils Absolute: 0.1 10*3/uL (ref 0.0–0.1)
Basophils Relative: 1 %
Eosinophils Absolute: 0.3 10*3/uL (ref 0.0–0.5)
Eosinophils Relative: 4 %
HCT: 44.5 % (ref 36.0–46.0)
Hemoglobin: 14.8 g/dL (ref 12.0–15.0)
Immature Granulocytes: 0 %
Lymphocytes Relative: 32 %
Lymphs Abs: 2.6 10*3/uL (ref 0.7–4.0)
MCH: 30.6 pg (ref 26.0–34.0)
MCHC: 33.3 g/dL (ref 30.0–36.0)
MCV: 91.9 fL (ref 80.0–100.0)
Monocytes Absolute: 0.6 10*3/uL (ref 0.1–1.0)
Monocytes Relative: 8 %
Neutro Abs: 4.5 10*3/uL (ref 1.7–7.7)
Neutrophils Relative %: 55 %
Platelets: 382 10*3/uL (ref 150–400)
RBC: 4.84 MIL/uL (ref 3.87–5.11)
RDW: 12.8 % (ref 11.5–15.5)
WBC: 8.2 10*3/uL (ref 4.0–10.5)
nRBC: 0 % (ref 0.0–0.2)

## 2023-05-14 LAB — COMPREHENSIVE METABOLIC PANEL
ALT: 24 U/L (ref 0–44)
AST: 21 U/L (ref 15–41)
Albumin: 4.2 g/dL (ref 3.5–5.0)
Alkaline Phosphatase: 33 U/L — ABNORMAL LOW (ref 38–126)
Anion gap: 8 (ref 5–15)
BUN: 18 mg/dL (ref 6–20)
CO2: 26 mmol/L (ref 22–32)
Calcium: 9.2 mg/dL (ref 8.9–10.3)
Chloride: 104 mmol/L (ref 98–111)
Creatinine, Ser: 0.91 mg/dL (ref 0.44–1.00)
GFR, Estimated: >60 mL/min (ref >60)
Glucose, Bld: 88 mg/dL (ref 70–99)
Potassium: 4.2 mmol/L (ref 3.5–5.1)
Sodium: 138 mmol/L (ref 135–145)
Total Bilirubin: 0.4 mg/dL (ref 0.3–1.2)
Total Protein: 7.2 g/dL (ref 6.5–8.1)

## 2023-05-14 LAB — URINALYSIS, ROUTINE W REFLEX MICROSCOPIC
Bacteria, UA: NONE SEEN
Bilirubin Urine: NEGATIVE
Glucose, UA: NEGATIVE mg/dL
Ketones, ur: NEGATIVE mg/dL
Leukocytes,Ua: NEGATIVE
Nitrite: NEGATIVE
Protein, ur: NEGATIVE mg/dL
Specific Gravity, Urine: 1.02 (ref 1.005–1.030)
pH: 6 (ref 5.0–8.0)

## 2023-05-14 LAB — I-STAT BETA HCG BLOOD, ED (MC, WL, AP ONLY): I-stat hCG, quantitative: <5 m[IU]/mL (ref <5)

## 2023-05-14 LAB — LIPASE, BLOOD: Lipase: 48 U/L (ref 11–51)

## 2023-05-14 NOTE — ED Triage Notes (Signed)
Pt having diarrhea x4 and there is bright red blood in stool starting today. Denies dark tarry stools. Also endorses N&V x1. Denies fever chills. Abd pain is RUQ LUQ and epigastric.

## 2023-05-15 NOTE — ED Notes (Signed)
Pt decided to leave while waiting for a room.  

## 2023-11-05 ENCOUNTER — Ambulatory Visit (HOSPITAL_COMMUNITY)
Admission: EM | Admit: 2023-11-05 | Discharge: 2023-11-06 | Disposition: A | Discharge status: Other Acute Inpatient Hospital | Payer: BLUE CROSS/BLUE SHIELD | Attending: Psychiatry | Admitting: Psychiatry

## 2023-11-05 ENCOUNTER — Encounter (HOSPITAL_COMMUNITY): Payer: Self-pay

## 2023-11-05 DIAGNOSIS — F411 Generalized anxiety disorder: Secondary | ICD-10-CM | POA: Diagnosis not present

## 2023-11-05 DIAGNOSIS — Z0289 Encounter for other administrative examinations: Secondary | ICD-10-CM | POA: Diagnosis not present

## 2023-11-05 DIAGNOSIS — R9431 Abnormal electrocardiogram [ECG] [EKG]: Secondary | ICD-10-CM | POA: Diagnosis not present

## 2023-11-05 DIAGNOSIS — Z9151 Personal history of suicidal behavior: Secondary | ICD-10-CM | POA: Insufficient documentation

## 2023-11-05 DIAGNOSIS — F319 Bipolar disorder, unspecified: Secondary | ICD-10-CM | POA: Diagnosis present

## 2023-11-05 DIAGNOSIS — R45851 Suicidal ideations: Secondary | ICD-10-CM | POA: Insufficient documentation

## 2023-11-05 DIAGNOSIS — F3170 Bipolar disorder, currently in remission, most recent episode unspecified: Secondary | ICD-10-CM | POA: Insufficient documentation

## 2023-11-05 LAB — COMPREHENSIVE METABOLIC PANEL
ALT: 17 U/L (ref 0–44)
AST: 15 U/L (ref 15–41)
Albumin: 4 g/dL (ref 3.5–5.0)
Alkaline Phosphatase: 43 U/L (ref 38–126)
Anion gap: 9 (ref 5–15)
BUN: 12 mg/dL (ref 6–20)
CO2: 23 mmol/L (ref 22–32)
Calcium: 9.3 mg/dL (ref 8.9–10.3)
Chloride: 104 mmol/L (ref 98–111)
Creatinine, Ser: 0.73 mg/dL (ref 0.44–1.00)
GFR, Estimated: >60 mL/min (ref >60)
Glucose, Bld: 84 mg/dL (ref 70–99)
Potassium: 3.7 mmol/L (ref 3.5–5.1)
Sodium: 136 mmol/L (ref 135–145)
Total Bilirubin: 0.6 mg/dL (ref <1.2)
Total Protein: 7.4 g/dL (ref 6.5–8.1)

## 2023-11-05 LAB — POCT PREGNANCY, URINE: Preg Test, Ur: NEGATIVE

## 2023-11-05 LAB — CBC WITH DIFFERENTIAL/PLATELET
Abs Immature Granulocytes: 0.01 10*3/uL (ref 0.00–0.07)
Basophils Absolute: 0.1 10*3/uL (ref 0.0–0.1)
Basophils Relative: 1 %
Eosinophils Absolute: 0.2 10*3/uL (ref 0.0–0.5)
Eosinophils Relative: 2 %
HCT: 43.4 % (ref 36.0–46.0)
Hemoglobin: 14.5 g/dL (ref 12.0–15.0)
Immature Granulocytes: 0 %
Lymphocytes Relative: 31 %
Lymphs Abs: 2.4 10*3/uL (ref 0.7–4.0)
MCH: 30.1 pg (ref 26.0–34.0)
MCHC: 33.4 g/dL (ref 30.0–36.0)
MCV: 90 fL (ref 80.0–100.0)
Monocytes Absolute: 0.5 10*3/uL (ref 0.1–1.0)
Monocytes Relative: 6 %
Neutro Abs: 4.6 10*3/uL (ref 1.7–7.7)
Neutrophils Relative %: 60 %
Platelets: 348 10*3/uL (ref 150–400)
RBC: 4.82 MIL/uL (ref 3.87–5.11)
RDW: 12.9 % (ref 11.5–15.5)
WBC: 7.7 10*3/uL (ref 4.0–10.5)
nRBC: 0 % (ref 0.0–0.2)

## 2023-11-05 LAB — POCT URINE DRUG SCREEN - MANUAL ENTRY (I-SCREEN)
POC Amphetamine UR: NOT DETECTED
POC Buprenorphine (BUP): NOT DETECTED
POC Cocaine UR: NOT DETECTED
POC Marijuana UR: NOT DETECTED
POC Methadone UR: NOT DETECTED
POC Methamphetamine UR: NOT DETECTED
POC Morphine: NOT DETECTED
POC Oxazepam (BZO): NOT DETECTED
POC Oxycodone UR: NOT DETECTED
POC Secobarbital (BAR): NOT DETECTED

## 2023-11-05 LAB — LIPID PANEL
Cholesterol: 199 mg/dL (ref 0–200)
HDL: 57 mg/dL (ref >40)
LDL Cholesterol: 126 mg/dL — ABNORMAL HIGH (ref 0–99)
Total CHOL/HDL Ratio: 3.5 {ratio}
Triglycerides: 80 mg/dL (ref <150)
VLDL: 16 mg/dL (ref 0–40)

## 2023-11-05 LAB — MAGNESIUM: Magnesium: 2.1 mg/dL (ref 1.7–2.4)

## 2023-11-05 LAB — POC URINE PREG, ED: Preg Test, Ur: NEGATIVE

## 2023-11-05 LAB — HEMOGLOBIN A1C
Hgb A1c MFr Bld: 5.5 % (ref 4.8–5.6)
Mean Plasma Glucose: 111.15 mg/dL

## 2023-11-05 LAB — VALPROIC ACID LEVEL: Valproic Acid Lvl: 17 ug/mL — ABNORMAL LOW (ref 50.0–100.0)

## 2023-11-05 LAB — ETHANOL: Alcohol, Ethyl (B): <10 mg/dL (ref <10)

## 2023-11-05 LAB — TSH: TSH: 1.187 u[IU]/mL (ref 0.350–4.500)

## 2023-11-05 MED ORDER — NICOTINE 21 MG/24HR TD PT24
21.0000 mg | MEDICATED_PATCH | Freq: Every day | TRANSDERMAL | Status: DC
  Administered 2023-11-05 – 2023-11-06 (×2): 21 mg via TRANSDERMAL
  Filled 2023-11-05 (×2): qty 1

## 2023-11-05 MED ORDER — NICOTINE POLACRILEX 2 MG MT GUM
2.0000 mg | CHEWING_GUM | OROMUCOSAL | Status: DC
  Administered 2023-11-05 – 2023-11-06 (×4): 2 mg via ORAL
  Filled 2023-11-05 (×4): qty 1

## 2023-11-05 MED ORDER — DIVALPROEX SODIUM 125 MG PO CSDR
500.0000 mg | DELAYED_RELEASE_CAPSULE | Freq: Once | ORAL | Status: AC
  Administered 2023-11-05: 500 mg via ORAL
  Filled 2023-11-05: qty 4

## 2023-11-05 MED ORDER — HYDROXYZINE HCL 25 MG PO TABS
25.0000 mg | ORAL_TABLET | Freq: Three times a day (TID) | ORAL | Status: DC | PRN
  Administered 2023-11-05: 25 mg via ORAL
  Filled 2023-11-05: qty 1

## 2023-11-05 MED ORDER — ALUM & MAG HYDROXIDE-SIMETH 200-200-20 MG/5ML PO SUSP
30.0000 mL | ORAL | Status: DC | PRN
Start: 2023-11-05 — End: 2023-11-06

## 2023-11-05 MED ORDER — TRAZODONE HCL 50 MG PO TABS
50.0000 mg | ORAL_TABLET | Freq: Every evening | ORAL | Status: DC | PRN
  Administered 2023-11-05: 50 mg via ORAL
  Filled 2023-11-05: qty 1

## 2023-11-05 MED ORDER — MAGNESIUM HYDROXIDE 400 MG/5ML PO SUSP: 30.0000 mL | Freq: Every day | ORAL | Status: DC | PRN

## 2023-11-05 MED ORDER — ACETAMINOPHEN 325 MG PO TABS: 650.0000 mg | ORAL_TABLET | Freq: Four times a day (QID) | ORAL | Status: DC | PRN

## 2023-11-05 NOTE — ED Notes (Signed)
Pt sitting in chair next to bed at this hour. No apparent distress. RR even and unlabored. Monitored for safety.

## 2023-11-05 NOTE — Progress Notes (Signed)
   11/05/23 1739  [facility] Triage Screening (Walk-ins at [facility] only)  How Did You Hear About [place]? Self  What Is the Reason for Your Visit/Call Today? [name] "[name]" is a 22 year old female who presents to [facility] unaccompanied due to SI with a plan to shoot himself. Pt reports he has access to his dads guns. He reports previous suicide attempt in 2018 by attempting to overdose on medication, he states he was hospitalized at [facility] for 18 days. Pt states he lives with a roommate. 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 Pt states prior to relapsing he was clean for 4 months. He reports he has not used in about 2 weeks. Pt states he spoke with his therapist today and they recommended that he come in for additional assistance. Pt reports he was seeing his therapist 1x a week but stopped seeing them for about 6 weeks, and returned to therapy today. Pt is established with outpatient therapy and psychiatry at the [facility]. He reports his medication was changed about 1 week ago and he believes this is also a trigger for his current mood. He reports being diagnosed with anxiety, depression, Bi-polar disorder.Pt cannot verbally contract for safety.  How Long Has This Been Causing You Problems? 1 wk - 1 month  Have You Recently Had Any Thoughts About Hurting Yourself? Yes  How long ago did you have thoughts about hurting yourself? today  Are You Planning to Commit Suicide/Harm Yourself At This time? Yes  Have you Recently Had Thoughts About Hurting Someone Karolee Ohs? No  Are You Planning To Harm Someone At This Time? No  Physical Abuse Denies  Verbal Abuse Denies  Sexual Abuse Denies  Exploitation of patient/patient's resources Denies  Self-Neglect Denies  Possible abuse reported to:  (none)  Are you currently experiencing any auditory, visual or other hallucinations? No  Have You Used Any Alcohol or Drugs in the Past 24 Hours? No  Do you have any current medical  co-morbidities that require immediate attention? No  Clinician description of patient physical appearance/behavior: nervous but cooperative  What Do You Feel Would Help You the Most Today? Treatment for Depression or other mood problem  If access to [facility] Urgent Care was not available, would you have sought care in the Emergency Department? No  Determination of Need Urgent (48 hours)  Options For Referral [facility] Urgent Care;Medication Management;Inpatient Hospitalization;Outpatient Therapy  Determination of Need filed? Yes

## 2023-11-05 NOTE — Progress Notes (Signed)
Pt was accepted to [facility] TOMORROW 11/06/2023; Bed Assignment 306 PENDING Labs, orders. IVC paperwork is uploaded to chart BMU FAX Number [phone] Address: [address] [place], [place], [place] [address]  Pt meets inpatient criteria per Alona Bene, PMHNP  Attending Physician will be Dr. Shellee Milo  Report can be called to: [phone]  Pt can arrive after: Day [facility] and [facility] Charge RN to coordinate with care team.  Care Team notified: Day [facility] Rona Ravens, [place] [facility] [place], Matthew Wiggins,RN, Ceasar Mons, RN, [place], Sherivian Spears,NT, Ryta [place], Amy Clodfelter-Simmons,NT, [place], PMHNP    Maryjean Ka, MSW, [facility] 11/05/2023 10:25 PM

## 2023-11-05 NOTE — ED Notes (Addendum)
Pt is IVC. Pt here d/t suicidal ideation with plan to shoot himself with gun from his father's house. Pt still suicidal but contracts for safety here. States he wants to leave. Also admitted that if he was allowed to leave earlier that he might have driven to his father's house and shot himself. Pt denies HI, AVH and pain. Endorses anxiety and depression. Had a med change one week ago. Now taking Vraylar and Depakote. Pt states he was sober for 4 months before relapsing on cocaine and marijuana. "I didn't even want to smoke. I just did." Pt works for an [employer], counselling and also training dogs. States he works a lot. Pt denies any overt stressors in his life right now. Previous suicide attempt in 2018 - OD on phenobarbital. Pt is irritable but non-threatening. Pt sitting in chair at present.

## 2023-11-05 NOTE — ED Notes (Signed)
Another attempt made by a staff member and blood specimens obtained. Will be sent out to [facility] lab for processing.

## 2023-11-05 NOTE — ED Provider Notes (Signed)
[facility] Continuous Assessment Admission H&P  Date: 11/05/23 Patient Name: Samantha Hunter MRN: [id number] Chief Complaint:   Diagnoses:  Final diagnoses:  Suicidal ideation  Bipolar disorder in full remission, most recent episode unspecified type [facility])  GAD (generalized anxiety disorder)    HPI: Samantha Hunter, 22 y.o., adult patient seen face to face by this provider, consulted with Dr. Lucianne Muss; and chart reviewed on 11/05/23.  On evaluation Samantha Hunter reports that he relapsed on cocaine and THC on October 31, and states now he is having feelings of guilt and remorse and has suicidal thoughts with a plan to shoot himself. He states that he does have access to his fathers gun, states he lives with roommates but is able to get over to his parent's house to where the guns are located.  Patient endorses using cocaine and THC, but has not used either in 2 weeks and denies using any other illicit drugs or alcohol.  They also states that their psychiatrist switched medications around, and he states that there may have been feeling "off." He states she was taken off all his " night time medications" but unable to recall what they were. He reports previous suicide attempt in 2018 by attempting to overdose on medication, he states he was hospitalized at [facility] for 18 days.  Pt states prior to relapsing he was clean for 4 months. He reports he has not used in about 2 weeks.  He states he has been sober for 6 months, currently feeling hopeless and like a failure.  Pt states he spoke with his therapist today and they recommended that he come in for additional assistance. Pt reports he was seeing his therapist 1x a week but stopped seeing them for about 6 weeks, and returned to therapy today. Pt is established with outpatient therapy and psychiatry at the [facility]. He reports being diagnosed with anxiety, depression, Bi-polar disorder.Pt cannot verbally contract for safety. Patient works at [employer]  and the [employer] [place].  He states poor sleep of about 2-3 hours a night and poor appetite, no full meals just snacking.   During evaluation Samantha Hunter is sitting in the assessment room and appears to be in moderate distress, due to anxiety. He is alert, oriented x 4, calm, cooperative and attentive. His mood is anxious with congruent affect.  He has normal speech, and behavior.  Objectively there is no evidence of psychosis/mania or delusional thinking.  Patient is able to converse coherently, goal directed thoughts, no distractibility, or pre-occupation.  He endorses suicidal ideation with a plan to shoot self. Denies self-harm/homicidal ideation, psychosis, and paranoia. Per chart review patient has a history of concussion 09/2021, mixed obsessional thoughts and acts, MDD, ODD, and gender dysphoria.     Total Time spent with patient: 30 minutes  Musculoskeletal  Strength & Muscle Tone: within normal limits Gait & Station: normal Patient leans: N/A  Psychiatric Specialty Exam  Presentation General Appearance:  Appropriate for Environment  Eye Contact: Fleeting  Speech: Clear and Coherent  Speech Volume: Normal  Handedness: Right   Mood and Affect  Mood: Anxious; Dysphoric  Affect: Appropriate; Flat   Thought Process  Thought Processes: Coherent  Descriptions of Associations:Intact  Orientation:Full (Time, Place and Person)  Thought Content:WDL    Hallucinations:Hallucinations: None  Ideas of Reference:None  Suicidal Thoughts:Suicidal Thoughts: Yes, Active SI Active Intent and/or Plan: With Plan; With Intent; With Means to Carry Out  Homicidal Thoughts:Homicidal Thoughts: No   Sensorium  Memory: Immediate Good; Recent Good; Remote Fair  Judgment: Poor  Insight: Poor   Executive Functions  Concentration: Fair  Attention Span: Fair  Recall: [employer] of Knowledge: Fair  Language: Fair   Psychomotor  Activity  Psychomotor Activity: Psychomotor Activity: Normal   Assets  Assets: Desire for Improvement; Communication Skills; Social Support; Health and safety inspector; Housing   Sleep  Sleep: Sleep: Poor Number of Hours of Sleep: 3   Nutritional Assessment (For OBS and FBC admissions only) Has the patient had a weight loss or gain of 10 pounds or more in the last 3 months?: No Has the patient had a decrease in food intake/or appetite?: No Does the patient have dental problems?: No Does the patient have eating habits or behaviors that may be indicators of an eating disorder including binging or inducing vomiting?: No Has the patient recently lost weight without trying?: 0 Has the patient been eating poorly because of a decreased appetite?: 1 Malnutrition Screening Tool Score: 1    Physical Exam Vitals and nursing note reviewed. Exam conducted with a chaperone present.  Psychiatric:        Attention and Perception: Attention normal.        Mood and Affect: Mood is anxious. Affect is flat.        Speech: Speech normal.        Behavior: Behavior is cooperative.        Thought Content: Thought content includes suicidal ideation. Thought content includes suicidal plan.        Cognition and Memory: Memory normal.        Judgment: Judgment is impulsive and inappropriate.    Review of Systems  Constitutional: Negative.   Psychiatric/Behavioral:  Positive for depression, substance abuse and suicidal ideas.     Blood pressure (!) 151/87, pulse (!) 103, temperature 98.3 F (36.8 C), temperature source Oral, resp. rate 18, SpO2 99%. There is no height or weight on file to calculate BMI.  Past Psychiatric History: concussion 09/2021, mixed obsessional thoughts and acts, MDD, ODD, and gender dysphoria.     Is the patient at risk to self? Yes  Has the patient been a risk to self in the past 6 months? No .    Has the patient been a risk to self within the distant past? Yes   Is  the patient a risk to others? No   Has the patient been a risk to others in the past 6 months? No   Has the patient been a risk to others within the distant past? No   Past Medical History: No significant medical hx  Family History: No significant family hx   Last Labs:  Admission on 05/14/2023, Discharged on 05/15/2023  Component Date Value Ref Range Status   Sodium 05/14/2023 138  135 - 145 mmol/L Final   Potassium 05/14/2023 4.2  3.5 - 5.1 mmol/L Final   Chloride 05/14/2023 104  98 - 111 mmol/L Final   CO2 05/14/2023 26  22 - 32 mmol/L Final   Glucose, Bld 05/14/2023 88  70 - 99 mg/dL Final   Glucose reference range applies only to samples taken after fasting for at least 8 hours.   BUN 05/14/2023 18  6 - 20 mg/dL Final   Creatinine, Ser 05/14/2023 0.91  0.44 - 1.00 mg/dL Final   Calcium 27/25/3664 9.2  8.9 - 10.3 mg/dL Final   Total Protein 40/34/7425 7.2  6.5 - 8.1 g/dL Final   Albumin 95/63/8756 4.2  3.5 -  5.0 g/dL Final   AST 16/09/9603 21  15 - 41 U/L Final   ALT 05/14/2023 24  0 - 44 U/L Final   Alkaline Phosphatase 05/14/2023 33 (L)  38 - 126 U/L Final   Total Bilirubin 05/14/2023 0.4  0.3 - 1.2 mg/dL Final   GFR, Estimated 05/14/2023 >60  >60 mL/min Final   Comment: (NOTE) Calculated using the CKD-EPI Creatinine Equation (2021)    Anion gap 05/14/2023 8  5 - 15 Final   Performed at [facility], [address] [place], [place] [address]   Lipase 05/14/2023 48  11 - 51 U/L Final   Performed at [facility], [address], [place], [place] [address]   WBC 05/14/2023 8.2  4.0 - 10.5 K/uL Final   RBC 05/14/2023 4.84  3.87 - 5.11 MIL/uL Final   Hemoglobin 05/14/2023 14.8  12.0 - 15.0 g/dL Final   HCT 78/29/5621 44.5  36.0 - 46.0 % Final   MCV 05/14/2023 91.9  80.0 - 100.0 fL Final   MCH 05/14/2023 30.6  26.0 - 34.0 pg Final   MCHC 05/14/2023 33.3  30.0 - 36.0 g/dL Final   RDW 30/86/5784 12.8  11.5 - 15.5 % Final   Platelets 05/14/2023 382  150 - 400 K/uL  Final   nRBC 05/14/2023 0.0  0.0 - 0.2 % Final   Neutrophils Relative % 05/14/2023 55  % Final   Neutro Abs 05/14/2023 4.5  1.7 - 7.7 K/uL Final   Lymphocytes Relative 05/14/2023 32  % Final   Lymphs Abs 05/14/2023 2.6  0.7 - 4.0 K/uL Final   Monocytes Relative 05/14/2023 8  % Final   Monocytes Absolute 05/14/2023 0.6  0.1 - 1.0 K/uL Final   Eosinophils Relative 05/14/2023 4  % Final   Eosinophils Absolute 05/14/2023 0.3  0.0 - 0.5 K/uL Final   Basophils Relative 05/14/2023 1  % Final   Basophils Absolute 05/14/2023 0.1  0.0 - 0.1 K/uL Final   Immature Granulocytes 05/14/2023 0  % Final   Abs Immature Granulocytes 05/14/2023 0.02  0.00 - 0.07 K/uL Final   Performed at [facility], [address], [place], [place] [address]   Color, Urine 05/14/2023 YELLOW  YELLOW Final   APPearance 05/14/2023 CLEAR  CLEAR Final   Specific Gravity, Urine 05/14/2023 1.020  1.005 - 1.030 Final   pH 05/14/2023 6.0  5.0 - 8.0 Final   Glucose, UA 05/14/2023 NEGATIVE  NEGATIVE mg/dL Final   Hgb urine dipstick 05/14/2023 SMALL (A)  NEGATIVE Final   Bilirubin Urine 05/14/2023 NEGATIVE  NEGATIVE Final   Ketones, ur 05/14/2023 NEGATIVE  NEGATIVE mg/dL Final   Protein, ur 52/84/1324 NEGATIVE  NEGATIVE mg/dL Final   Nitrite 40/09/2724 NEGATIVE  NEGATIVE Final   Leukocytes,Ua 05/14/2023 NEGATIVE  NEGATIVE Final   RBC / HPF 05/14/2023 0-5  0 - 5 RBC/hpf Final   WBC, UA 05/14/2023 6-10  0 - 5 WBC/hpf Final   Bacteria, UA 05/14/2023 NONE SEEN  NONE SEEN Final   Squamous Epithelial / HPF 05/14/2023 0-5  0 - 5 /HPF Final   Performed at [facility], [address] [place], [place] [address]   I-stat hCG, quantitative 05/14/2023 <5.0  <5 mIU/mL Final   Comment 3 05/14/2023          Final   Comment:   GEST. AGE      CONC.  (mIU/mL)   <=1 WEEK  5 - 50     2 WEEKS       50 - 500     3 WEEKS       100 - 10,000     4 WEEKS     1,000 - 30,000        FEMALE AND NON-PREGNANT FEMALE:     LESS THAN 5  mIU/mL     Allergies: Pineapple  Medications:  Facility Ordered Medications  Medication   acetaminophen (TYLENOL) tablet 650 mg   alum & mag hydroxide-simeth (MAALOX/MYLANTA) 200-200-20 MG/5ML suspension 30 mL   magnesium hydroxide (MILK OF MAGNESIA) suspension 30 mL   hydrOXYzine (ATARAX) tablet 25 mg   traZODone (DESYREL) tablet 50 mg   nicotine polacrilex (NICORETTE) gum 2 mg   nicotine (NICODERM CQ - dosed in mg/24 hours) patch 21 mg   PTA Medications  Medication Sig   Syringe, Disposable, 1 ML MISC 1 Units by Does not apply route as needed.   budesonide-formoterol (SYMBICORT) 160-4.5 MCG/ACT inhaler Inhale 2 puffs into the lungs 2 (two) times daily.   albuterol (PROVENTIL HFA;VENTOLIN HFA) 108 (90 Base) MCG/ACT inhaler Inhale 2 puffs into the lungs every 4 (four) hours as needed for wheezing or shortness of breath (cough, shortness of breath or wheezing.).   NEEDLE, DISP, 18 G 18G X 1-1/2" MISC 1 Units by Does not apply route as needed.   NEEDLE, DISP, 23 G 23G X 3/4" MISC 1 Units by Does not apply route as needed.   testosterone cypionate (DEPOTESTOSTERONE CYPIONATE) 200 MG/ML injection Inject 0.7 mLs (140 mg total) into the muscle every 14 (fourteen) days.   IBUPROFEN PO Take by mouth as needed.   azithromycin (ZITHROMAX) 250 MG tablet Sig as indicated   albuterol (PROVENTIL) (2.5 MG/3ML) 0.083% nebulizer solution TAKE 3 MLS BY NEBULIZATION EVERY 6 (SIX) HOURS AS NEEDED FOR WHEEZING OR SHORTNESS OF BREATH   amoxicillin-clavulanate (AUGMENTIN) 875-125 MG tablet Take 1 tablet by mouth every 12 (twelve) hours.   cephALEXin (KEFLEX) 500 MG capsule Take 1 capsule (500 mg total) by mouth 4 (four) times daily.   ondansetron (ZOFRAN ODT) 4 MG disintegrating tablet Take 1 tablet (4 mg total) by mouth every 8 (eight) hours as needed for nausea or vomiting.   doxycycline (VIBRAMYCIN) 100 MG capsule Take 1 capsule (100 mg total) by mouth 2 (two) times daily. One po bid x 7 days       Medical Decision Making  Pt case reviewed and discussed with Dr. Lucianne Muss. Pt does meet criteria for IVC and inpatient psychiatric treatment. Patient needs inpatient psychiatric admission for stabilization and treatment. [facility] and CSW notified of disposition. Will restart patient home medications. EDP, RN, and LCSW notified of disposition.  Patient has been IVCd by this provider.     Recommendations    Alona Bene, PMHNP 11/05/23  6:27 PM

## 2023-11-05 NOTE — ED Notes (Signed)
Pt alert and sitting in chair at this hour. No apparent distress. RR even and unlabored. Monitored for safety.

## 2023-11-05 NOTE — ED Notes (Addendum)
Pt is a 22 trans F to M admitted for suicidal ideation with a plan to use a firearm.   Pt reports recent relapse on cocaine.  Flat affect and depressed mood. Pt's Belongings locked in locker  #11   Pt was searched with no contraband found.  Pt was oriented to milieu and room.   Pt is in view of nursing staff for safety.

## 2023-11-05 NOTE — ED Notes (Signed)
Pt states that he was prescribed Vraylar and Depakote a week ago. Pt was given a prescription for Depakote but samples for Vraylar. Providers notified of need for order for Depakote. Pt states he takes it at night. Pt told to speak to provider in the morning so that they can contact his doctor about the Vraylar.

## 2023-11-06 ENCOUNTER — Encounter: Payer: Self-pay | Admitting: Psychiatry

## 2023-11-06 ENCOUNTER — Inpatient Hospital Stay
Admission: AD | Admit: 2023-11-06 | Discharge: 2023-11-12 | DRG: 885 | Disposition: A | Discharge status: Home / Self Care | Payer: BLUE CROSS/BLUE SHIELD | Source: Intra-hospital | Attending: Psychiatry | Admitting: Psychiatry

## 2023-11-06 ENCOUNTER — Other Ambulatory Visit: Payer: Self-pay

## 2023-11-06 DIAGNOSIS — F319 Bipolar disorder, unspecified: Principal | ICD-10-CM | POA: Diagnosis present

## 2023-11-06 DIAGNOSIS — Z8249 Family history of ischemic heart disease and other diseases of the circulatory system: Secondary | ICD-10-CM

## 2023-11-06 DIAGNOSIS — Z813 Family history of other psychoactive substance abuse and dependence: Secondary | ICD-10-CM

## 2023-11-06 DIAGNOSIS — R45851 Suicidal ideations: Secondary | ICD-10-CM | POA: Diagnosis present

## 2023-11-06 DIAGNOSIS — F1210 Cannabis abuse, uncomplicated: Secondary | ICD-10-CM | POA: Diagnosis present

## 2023-11-06 DIAGNOSIS — Z818 Family history of other mental and behavioral disorders: Secondary | ICD-10-CM

## 2023-11-06 DIAGNOSIS — Z91018 Allergy to other foods: Secondary | ICD-10-CM | POA: Diagnosis not present

## 2023-11-06 DIAGNOSIS — F419 Anxiety disorder, unspecified: Secondary | ICD-10-CM

## 2023-11-06 DIAGNOSIS — F1410 Cocaine abuse, uncomplicated: Secondary | ICD-10-CM | POA: Diagnosis present

## 2023-11-06 DIAGNOSIS — J45909 Unspecified asthma, uncomplicated: Secondary | ICD-10-CM | POA: Diagnosis present

## 2023-11-06 DIAGNOSIS — R030 Elevated blood-pressure reading, without diagnosis of hypertension: Secondary | ICD-10-CM | POA: Diagnosis not present

## 2023-11-06 DIAGNOSIS — Z79899 Other long term (current) drug therapy: Secondary | ICD-10-CM

## 2023-11-06 DIAGNOSIS — F17210 Nicotine dependence, cigarettes, uncomplicated: Secondary | ICD-10-CM | POA: Diagnosis present

## 2023-11-06 DIAGNOSIS — Z9151 Personal history of suicidal behavior: Secondary | ICD-10-CM

## 2023-11-06 DIAGNOSIS — Z79890 Hormone replacement therapy (postmenopausal): Secondary | ICD-10-CM | POA: Diagnosis not present

## 2023-11-06 DIAGNOSIS — F3170 Bipolar disorder, currently in remission, most recent episode unspecified: Secondary | ICD-10-CM | POA: Diagnosis not present

## 2023-11-06 DIAGNOSIS — F640 Transsexualism: Secondary | ICD-10-CM | POA: Diagnosis present

## 2023-11-06 DIAGNOSIS — K219 Gastro-esophageal reflux disease without esophagitis: Secondary | ICD-10-CM | POA: Diagnosis present

## 2023-11-06 DIAGNOSIS — Z23 Encounter for immunization: Secondary | ICD-10-CM

## 2023-11-06 MED ORDER — HYDROXYZINE HCL 25 MG PO TABS: 25.0000 mg | ORAL_TABLET | Freq: Three times a day (TID) | ORAL | Status: DC | PRN

## 2023-11-06 MED ORDER — NICOTINE 21 MG/24HR TD PT24
21.0000 mg | MEDICATED_PATCH | Freq: Every day | TRANSDERMAL | Status: DC
  Administered 2023-11-06 – 2023-11-12 (×7): 21 mg via TRANSDERMAL
  Filled 2023-11-06 (×7): qty 1

## 2023-11-06 MED ORDER — HALOPERIDOL LACTATE 5 MG/ML IJ SOLN: 5.0000 mg | Freq: Three times a day (TID) | INTRAMUSCULAR | Status: DC | PRN

## 2023-11-06 MED ORDER — MAGNESIUM HYDROXIDE 400 MG/5ML PO SUSP
30.0000 mL | Freq: Every day | ORAL | Status: DC | PRN
Start: 2023-11-06 — End: 2023-11-12

## 2023-11-06 MED ORDER — LORAZEPAM 2 MG/ML IJ SOLN: 2.0000 mg | Freq: Three times a day (TID) | INTRAMUSCULAR | Status: DC | PRN

## 2023-11-06 MED ORDER — DIVALPROEX SODIUM ER 500 MG PO TB24
500.0000 mg | ORAL_TABLET | Freq: Every day | ORAL | Status: DC
  Administered 2023-11-06 – 2023-11-09 (×4): 500 mg via ORAL
  Filled 2023-11-06 (×4): qty 1

## 2023-11-06 MED ORDER — TOPIRAMATE 25 MG PO TABS
100.0000 mg | ORAL_TABLET | Freq: Two times a day (BID) | ORAL | Status: DC
  Administered 2023-11-07: 100 mg via ORAL
  Filled 2023-11-06: qty 4

## 2023-11-06 MED ORDER — GABAPENTIN 300 MG PO CAPS
300.0000 mg | ORAL_CAPSULE | Freq: Three times a day (TID) | ORAL | Status: DC
  Filled 2023-11-06: qty 1

## 2023-11-06 MED ORDER — ALBUTEROL SULFATE HFA 108 (90 BASE) MCG/ACT IN AERS: 2.0000 | INHALATION_SPRAY | RESPIRATORY_TRACT | Status: DC | PRN

## 2023-11-06 MED ORDER — MOMETASONE FURO-FORMOTEROL FUM 200-5 MCG/ACT IN AERO
2.0000 | INHALATION_SPRAY | Freq: Two times a day (BID) | RESPIRATORY_TRACT | Status: DC
  Filled 2023-11-06: qty 8.8

## 2023-11-06 MED ORDER — ACETAMINOPHEN 325 MG PO TABS: 650.0000 mg | ORAL_TABLET | Freq: Four times a day (QID) | ORAL | Status: DC | PRN

## 2023-11-06 MED ORDER — ALUM & MAG HYDROXIDE-SIMETH 200-200-20 MG/5ML PO SUSP: 30.0000 mL | ORAL | Status: DC | PRN

## 2023-11-06 MED ORDER — TRAZODONE HCL 50 MG PO TABS: 50.0000 mg | ORAL_TABLET | Freq: Every evening | ORAL | Status: DC | PRN

## 2023-11-06 MED ORDER — NICOTINE POLACRILEX 2 MG MT GUM
4.0000 mg | CHEWING_GUM | OROMUCOSAL | Status: DC
  Administered 2023-11-06 – 2023-11-07 (×6): 4 mg via ORAL
  Filled 2023-11-06 (×6): qty 2

## 2023-11-06 MED ORDER — HYDROXYZINE HCL 50 MG PO TABS
50.0000 mg | ORAL_TABLET | Freq: Four times a day (QID) | ORAL | Status: DC | PRN
  Administered 2023-11-06 – 2023-11-12 (×16): 50 mg via ORAL
  Filled 2023-11-06 (×16): qty 1

## 2023-11-06 MED ORDER — DIPHENHYDRAMINE HCL 50 MG/ML IJ SOLN: 50.0000 mg | Freq: Three times a day (TID) | INTRAMUSCULAR | Status: DC | PRN

## 2023-11-06 MED ORDER — INFLUENZA VIRUS VACC SPLIT PF (FLUZONE) 0.5 ML IM SUSY
0.5000 mL | PREFILLED_SYRINGE | INTRAMUSCULAR | Status: AC
  Administered 2023-11-07: 0.5 mL via INTRAMUSCULAR
  Filled 2023-11-06: qty 0.5

## 2023-11-06 MED ORDER — TRAZODONE HCL 100 MG PO TABS
100.0000 mg | ORAL_TABLET | Freq: Every evening | ORAL | Status: DC | PRN
  Administered 2023-11-06 – 2023-11-11 (×6): 100 mg via ORAL
  Filled 2023-11-06 (×6): qty 1

## 2023-11-06 NOTE — Progress Notes (Signed)
Patient presents with flat affect. Bizarre, isolative to self. Spent most of early shift in chair by phones. Did call someone this afternoon. Passive SI, But contracts for safety. Guarded, Forwards minimal. Encouragement and support provided. Safety checks maintained. Medications given as prescribed. Pt receptive and remains safe on unit with q 15 min checks.

## 2023-11-06 NOTE — Plan of Care (Signed)
Problem: Education: Goal: Emotional status will improve Outcome: Not Progressing Goal: Mental status will improve Outcome: Not Progressing Goal: Verbalization of understanding the information provided will improve Outcome: Not Progressing   Problem: Activity: Goal: Interest or engagement in activities will improve Outcome: Not Progressing

## 2023-11-06 NOTE — ED Notes (Signed)
Pt discharged with sheriff due to ivc  Pt alert, oriented, and ambulatory.  Safety maintained.

## 2023-11-06 NOTE — ED Notes (Signed)
Pt discharged with sheriff due to ivc .   Pt alert, oriented, and ambulatory.  Safety maintained.

## 2023-11-06 NOTE — ED Notes (Signed)
Patient  sleeping in no acute stress. RR even and unlabored .Environment secured .Will continue to monitor for safely.

## 2023-11-06 NOTE — Tx Team (Signed)
Initial Treatment Plan 11/06/2023 1:16 PM Samantha Hunter YSA:[id number]    PATIENT STRESSORS: Medication change or noncompliance     PATIENT STRENGTHS: Ability for insight  General fund of knowledge  Supportive family/friends    PATIENT IDENTIFIED PROBLEMS:                      DISCHARGE CRITERIA:  Ability to meet basic life and health needs  PRELIMINARY DISCHARGE PLAN: Outpatient therapy Return to previous living arrangement  PATIENT/FAMILY INVOLVEMENT: This treatment plan has been presented to and reviewed with the patient, Samantha Hunter,, .  The patient and family have been given the opportunity to ask questions and make suggestions.  Velna Hatchet, RN 11/06/2023, 1:16 PM

## 2023-11-06 NOTE — ED Notes (Signed)
Provider states that patient can have gum and nictotine patch

## 2023-11-06 NOTE — Group Note (Signed)
Date:  11/06/2023 Time:  6:24 PM  Group Topic/Focus:  Healthy Communication:   The focus of this group is to discuss communication, barriers to communication, as well as healthy ways to communicate with others. Outdoor recreation structured activity    Participation Level:  Did Not Attend   Rosaura Carpenter 11/06/2023, 6:24 PM

## 2023-11-06 NOTE — ED Notes (Signed)
Provider did come see patient before she left

## 2023-11-06 NOTE — Plan of Care (Signed)
Patient is an IVC'd patient to BMU to MDD with SI transferred from [facility].  Endorses SI on admission with access to a gun but contracts for safety.  States here for medication adjustment.  Was recently changed by her psychiatrist.  Denies HI, AVH, endorses anxiety 7/10 and depression 8/10. Going through transition and goes by [employer]. Has a nicotine patch on but is requesting gum as well - met with NP and clarified orders.  Will continue to monitor.

## 2023-11-06 NOTE — ED Notes (Signed)
Rn called report to susan rn states patient can come.

## 2023-11-06 NOTE — ED Notes (Signed)
Pt asleep at this hour. No apparent distress. RR even and unlabored. Monitored for safety.  

## 2023-11-06 NOTE — Progress Notes (Signed)
Patient is an involuntary admission to BMU for MDD with SI. Patient states passive SI with access to gun but states he is in a safe place here and will not hurt himself.  Denies HI, AVH, Endorse both anxiety and depression.  Is here for medication adjustment.  Refuses gabapentin d/t abuse in the past and doesn't want to relapse.  Declined topamax today stating not having any cravings.  Would like to talk to the NP about changing it to either bedtime or prn.  Calm and cooperative but did get frustrated and start cursing at one point.  Was redirectable and apologized.  Will continue to monitor.

## 2023-11-06 NOTE — Group Note (Signed)
Recreation Therapy Group Note   Group Topic:Goal Setting  Group Date: 11/06/2023 Start Time: 1000 End Time: 1100 Facilitators: Rosina Lowenstein, LRT, CTRS Location:  Craft Room  Group Description: Product/process development scientist. Patients were given many different magazines, a glue stick, markers, and a piece of cardstock paper. LRT and pts discussed the importance of having goals in life. LRT and pts discussed the difference between short-term and long-term goals, as well as what a SMART goal is. LRT encouraged pts to create a vision board, with images they picked and then cut out with safety scissors from the magazine, for themselves, that capture their short and long-term goals. LRT encouraged pts to show and explain their vision board to the group.   Goal Area(s) Addressed:  Patient will gain knowledge of short vs. long term goals.  Patient will identify goals for themselves. Patient will practice setting SMART goals. Patient will verbalize their goals to LRT and peers.   Affect/Mood: N/A   Participation Level: Did not attend    Clinical Observations/Individualized Feedback: Samantha Hunter did not attend group due to not being on the unit yet.   Plan: Continue to engage patient in RT group sessions 2-3x/week.   Rosina Lowenstein, LRT, CTRS 11/06/2023 11:49 AM

## 2023-11-06 NOTE — ED Notes (Signed)
Rn notified provider that patient wanted to see her

## 2023-11-06 NOTE — Group Note (Addendum)
[facility] LCSW Group Therapy Note   Group Date: 11/06/2023 Start Time: 1300 End Time: 1410  Type of Therapy/Topic:  Group Therapy:  Feelings about Diagnosis  Participation Level:  Active   Mood: Appropriate    Description of Group:    This group will allow patients to explore their thoughts and feelings about diagnoses they have received. Patients will be guided to explore their level of understanding and acceptance of these diagnoses. Facilitator will encourage patients to process their thoughts and feelings about the reactions of others to their diagnosis, and will guide patients in identifying ways to discuss their diagnosis with significant others in their lives. This group will be process-oriented, with patients participating in exploration of their own experiences as well as giving and receiving support and challenge from other group members.   Therapeutic Goals: 1. Patient will demonstrate understanding of diagnosis as evidence by identifying two or more symptoms of the disorder:  2. Patient will be able to express two feelings regarding the diagnosis 3. Patient will demonstrate ability to communicate their needs through discussion and/or role plays  Summary of Patient Progress: During group, patient was active and shared personal experiences that added to a positive group dynamic and discussion. Although guarded initially, the patient engaged in active listening and was very attentive to group facilitator. Patient offered insight, perspective and did well being respectful and empathetic to peers.    Therapeutic Modalities:   Cognitive Behavioral Therapy Brief Therapy Feelings Identification    Lowry Ram, LCSW

## 2023-11-06 NOTE — Group Note (Signed)
Date:  11/06/2023 Time:  1:50 PM  Group Topic/Focus:  Emotional Education:   The focus of this group is to discuss what feelings/emotions are, and how they are experienced. Recovery Goals:   The focus of this group is to identify appropriate goals for recovery and establish a plan to achieve them.    Participation Level:  Did Not Attend  Samantha Hunter 11/06/2023, 1:50 PM

## 2023-11-06 NOTE — ED Notes (Signed)
Sheriff called for transport due to patient being ivc

## 2023-11-06 NOTE — ED Notes (Signed)
Patient alert and oriented x 3. Denies SI/HI/AVH. Denies intent or plan to harm self or others. Routine conducted according to faculty protocol. Encourage patient to notify staff with any needs or concerns. Patient verbalized agreement and understanding. Will continue to monitor for safety. 

## 2023-11-06 NOTE — Consult Note (Addendum)
Initial Consultation Note   Patient: Samantha Hunter WUJ:[id number] DOB: [birth year] PCP: Pcp, No DOA: 11/06/2023 DOS: the patient was seen and examined on 11/06/2023 Primary service: Sarina Ill  Referring physician: Keith Rake, NP Reason for consult: Elevated blood pressure   Assessment and Plan:   Elevated blood pressure: He is asymptomatic.  Anxiety and current hospitalization may be contributing to elevated blood pressure. Most recent BP at 4 PM today was 117/84. Other BP readings today are as follows-94/64 at 5:46 AM, 124/73 at 10 AM and 131/103 at 10:46 AM. BP was 151/87 on 11/05/2023 at 5:46 PM No indication to start any antihypertensives at this time. Patient advised to establish care with a primary care physician for routine health maintenance so that his blood pressure can be monitored in the outpatient setting.   Depression with suicidal thoughts, anxiety: Follow-up with psychiatrist    TRH will sign off at present, please call us again when needed.    HPI: Samantha Hunter is a 22 y.o. adult with past medical history including but not limited to POTS (postural orthostatic tachycardia syndrome), anxiety, depression, asthma, polysubstance use disorder (opioids, cocaine, alcohol, tobacco, marijuana) who is in recovery and has been "clean for 15 days".  No history of hypertension, diabetes or hyperlipidemia.  He was admitted to the behavioral health unit because of suicidal thoughts.  Currently, patient has no complaints.  He said his blood pressure wasn't that high this morning.  He said he feels fine.  He stated that he does not like to be "in a place like this" and this may have caused his blood pressure to go up.  No headache, dizziness, palpitations, chest pain, shortness of breath, wheezing, vomiting, diarrhea, abdominal pain.  When asked about suicidal thoughts he said "I don't know".    Review of Systems: All other systems reviewed were negative Past  Medical History:  Diagnosis Date   Allergy    Anemia    Anxiety    Asthma    Cannabis use disorder, mild, abuse 03/16/2017   Depression    Gender dysphoria 05/12/2017   GERD (gastroesophageal reflux disease)    Non-suicidal self harm 03/16/2017   OCD (obsessive compulsive disorder) 03/16/2017   Suicidal ideation 03/16/2017   Past Surgical History:  Procedure Laterality Date   wisdom tooth removal Bilateral    Social History:  reports that he has been smoking cigarettes. He started smoking about 6 years ago. He has a 1.5 pack-year smoking history. He has quit using smokeless tobacco.  His smokeless tobacco use included chew. He reports that he does not currently use alcohol. He reports that he does not currently use drugs after having used the following drugs: Marijuana and Cocaine. Frequency: 14.00 times per week.  Allergies  Allergen Reactions   Coconut (Cocos Nucifera) Itching   Pineapple Hives and Itching    Family History  Problem Relation Age of Onset   Mental illness Mother    Alcohol abuse Mother    Drug abuse Mother    Depression Mother    Hyperlipidemia Mother    Mental illness Father    Alcohol abuse Father    Drug abuse Father    Depression Father    Cancer Father        skin   Heart disease Father    Hyperlipidemia Father    Stroke Father    Mental illness Brother    Anxiety disorder Brother    Depression Brother    Anxiety disorder Sister  Depression Sister    Mental illness Sister    Alcohol abuse Maternal Uncle    Drug abuse Maternal Uncle    Alcohol abuse Maternal Grandfather    Mental illness Brother    Mental illness Sister     Prior to Admission medications   Medication Sig Start Date End Date Taking? Authorizing Provider  albuterol (PROVENTIL HFA;VENTOLIN HFA) 108 (90 Base) MCG/ACT inhaler Inhale 2 puffs into the lungs every 4 (four) hours as needed for wheezing or shortness of breath (cough, shortness of breath or wheezing.). 12/06/18   Sherren Mocha, MD  albuterol (PROVENTIL) (2.5 MG/3ML) 0.083% nebulizer solution TAKE 3 MLS BY NEBULIZATION EVERY 6 (SIX) HOURS AS NEEDED FOR WHEEZING OR SHORTNESS OF BREATH 02/11/19   Sherren Mocha, MD  budesonide-formoterol [facility]) 160-4.5 MCG/ACT inhaler Inhale 2 puffs into the lungs 2 (two) times daily. Patient not taking: Reported on 11/06/2023 12/06/18   Sherren Mocha, MD  divalproex (DEPAKOTE ER) 500 MG 24 hr tablet Take 500 mg by mouth at bedtime. 10/30/23   [provider]  gabapentin (NEURONTIN) 300 MG capsule Take 300 mg by mouth 3 (three) times daily. Patient not taking: Reported on 11/06/2023 06/19/23   [provider]  hydrOXYzine (ATARAX) 25 MG tablet Take 25 mg by mouth 3 (three) times daily as needed for anxiety.    [provider]  LYBALVI 15-10 MG TABS Take 1 tablet by mouth daily. Patient not taking: Reported on 11/06/2023 05/22/23   [provider]    Physical Exam: Vitals:   11/06/23 1100 11/06/23 1649  BP: (!) 131/103 117/84  Pulse: 89 100  Resp: 16 17  Temp: (!) 97.5 F (36.4 C) 98.5 F (36.9 C)  TempSrc: Oral Oral  SpO2: 100% 100%  Weight: 118.4 kg   Height: 5\' 5"  (1.651 m)     Gen: No acute distress. Head: Normocephalic, atraumatic. Eyes: Pupils equal, round and reactive to light. Extraocular movements intact.  Sclerae nonicteric.  Mouth: Moist mucous membranes Neck: Supple, no jugular venous distention. Chest: Lungs are clear to auscultation with good air movement. No rales, rhonchi or wheezes.  CV: Heart sounds are regular with an S1, S2. No murmurs, rubs or gallops.  Abdomen: Soft, nontender, obese with normal active bowel sounds. No palpable masses. Extremities: Extremities are without clubbing, or cyanosis. No edema. Pedal pulses 2+.  Skin: Warm and dry.  Neuro: Alert and oriented times 3; grossly nonfocal.  Psych: Insight is good and judgment is appropriate.      Data Reviewed:    CBC, CMP unremarkable.  LDL 126, HDL 57,  triglycerides 80, total cholesterol 199.  Hemoglobin A1c 5.5, TSH 1.187.   Family Communication: None Primary team communication:  Thank you very much for involving [place] in the care of your patient.  Author: Lurene Shadow, MD 11/06/2023 5:02 PM  For on call review [email or web address].

## 2023-11-06 NOTE — Group Note (Signed)
Date:  11/06/2023 Time:  10:28 PM  Group Topic/Focus:  Personal Choices and Values:   The focus of this group is to help patients assess and explore the importance of values in their lives, how their values affect their decisions, how they express their values and what opposes their expression.    Participation Level:  Active  Participation Quality:  Appropriate and Attentive  Affect:  Appropriate  Cognitive:  Appropriate  Insight: Appropriate  Engagement in Group:  Engaged and Limited  Modes of Intervention:  Activity, Clarification, Discussion, Rapport Building, Socialization, and Support  Additional Comments:     Samantha Hunter 11/06/2023, 10:28 PM

## 2023-11-07 DIAGNOSIS — F319 Bipolar disorder, unspecified: Secondary | ICD-10-CM | POA: Diagnosis not present

## 2023-11-07 DIAGNOSIS — F1410 Cocaine abuse, uncomplicated: Secondary | ICD-10-CM | POA: Diagnosis present

## 2023-11-07 MED ORDER — NICOTINE POLACRILEX 2 MG MT GUM
4.0000 mg | CHEWING_GUM | OROMUCOSAL | Status: DC | PRN
  Administered 2023-11-07 – 2023-11-12 (×30): 4 mg via ORAL
  Filled 2023-11-07 (×28): qty 2

## 2023-11-07 MED ORDER — TOPIRAMATE 25 MG PO TABS
100.0000 mg | ORAL_TABLET | Freq: Two times a day (BID) | ORAL | Status: DC
  Administered 2023-11-07 – 2023-11-12 (×10): 100 mg via ORAL
  Filled 2023-11-07: qty 4
  Filled 2023-11-07: qty 1
  Filled 2023-11-07 (×9): qty 4

## 2023-11-07 NOTE — Group Note (Signed)
Date:  11/07/2023 Time:  10:01 PM  Group Topic/Focus:  Wrap-Up Group:   The focus of this group is to help patients review their daily goal of treatment and discuss progress on daily workbooks.    Participation Level:  Active  Participation Quality:  Appropriate  Affect:  Appropriate  Cognitive:  Appropriate  Insight: Appropriate  Engagement in Group:  Engaged  Modes of Intervention:  Discussion   Lenore Cordia 11/07/2023, 10:01 PM
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
## 2023-11-07 NOTE — BH IP Treatment Plan (Signed)
Interdisciplinary Treatment and Diagnostic Plan Update  11/07/2023 Time of Session: 9:52AM Samantha Hunter MRN: [id number]  Principal Diagnosis: Bipolar 1 disorder (HCC)  Secondary Diagnoses: Principal Problem:   Bipolar 1 disorder (HCC) Active Problems:   Elevated BP without diagnosis of hypertension   Current Medications:  Current Facility-Administered Medications  Medication Dose Route Frequency Provider Last Rate Last Admin   acetaminophen (TYLENOL) tablet 650 mg  650 mg Oral Q6H PRN Motley-Mangrum, Jadeka A, PMHNP       albuterol (VENTOLIN HFA) 108 (90 Base) MCG/ACT inhaler 2 puff  2 puff Inhalation Q4H PRN Motley-Mangrum, Jadeka A, PMHNP       alum & mag hydroxide-simeth (MAALOX/MYLANTA) 200-200-20 MG/5ML suspension 30 mL  30 mL Oral Q4H PRN Motley-Mangrum, Jadeka A, PMHNP       haloperidol lactate (HALDOL) injection 5 mg  5 mg Intramuscular TID PRN Motley-Mangrum, Jadeka A, PMHNP       And   diphenhydrAMINE (BENADRYL) injection 50 mg  50 mg Intramuscular TID PRN Motley-Mangrum, Jadeka A, PMHNP       And   LORazepam (ATIVAN) injection 2 mg  2 mg Intramuscular TID PRN Motley-Mangrum, Jadeka A, PMHNP       divalproex (DEPAKOTE ER) 24 hr tablet 500 mg  500 mg Oral QHS Motley-Mangrum, Jadeka A, PMHNP   500 mg at 11/06/23 2111   gabapentin (NEURONTIN) capsule 300 mg  300 mg Oral TID Motley-Mangrum, Jadeka A, PMHNP       hydrOXYzine (ATARAX) tablet 50 mg  50 mg Oral Q6H PRN Myriam Forehand, NP   50 mg at 11/07/23 0802   influenza vac split trivalent PF (FLULAVAL) injection 0.5 mL  0.5 mL Intramuscular Tomorrow-1000 Myriam Forehand, NP       magnesium hydroxide (MILK OF MAGNESIA) suspension 30 mL  30 mL Oral Daily PRN Motley-Mangrum, Jadeka A, PMHNP       mometasone-formoterol (DULERA) 200-5 MCG/ACT inhaler 2 puff  2 puff Inhalation BID Motley-Mangrum, Jadeka A, PMHNP       nicotine (NICODERM CQ - dosed in mg/24 hours) patch 21 mg  21 mg Transdermal Daily Motley-Mangrum, Jadeka A, PMHNP    21 mg at 11/07/23 0803   nicotine polacrilex (NICORETTE) gum 4 mg  4 mg Oral Q4H while awake Myriam Forehand, NP   4 mg at 11/07/23 0900   topiramate (TOPAMAX) tablet 100 mg  100 mg Oral BID Myriam Forehand, NP   100 mg at 11/07/23 0802   traZODone (DESYREL) tablet 100 mg  100 mg Oral QHS PRN Myriam Forehand, NP   100 mg at 11/06/23 2111   PTA Medications: Medications Prior to Admission  Medication Sig Dispense Refill Last Dose   albuterol (PROVENTIL HFA;VENTOLIN HFA) 108 (90 Base) MCG/ACT inhaler Inhale 2 puffs into the lungs every 4 (four) hours as needed for wheezing or shortness of breath (cough, shortness of breath or wheezing.). 3 Inhaler 1    albuterol (PROVENTIL) (2.5 MG/3ML) 0.083% nebulizer solution TAKE 3 MLS BY NEBULIZATION EVERY 6 (SIX) HOURS AS NEEDED FOR WHEEZING OR SHORTNESS OF BREATH 150 mL 1    budesonide-formoterol (SYMBICORT) 160-4.5 MCG/ACT inhaler Inhale 2 puffs into the lungs 2 (two) times daily. (Patient not taking: Reported on 11/06/2023) 3 Inhaler 2    divalproex (DEPAKOTE ER) 500 MG 24 hr tablet Take 500 mg by mouth at bedtime.      gabapentin (NEURONTIN) 300 MG capsule Take 300 mg by mouth 3 (three) times daily. (Patient not taking: Reported on  11/06/2023)      hydrOXYzine (ATARAX) 25 MG tablet Take 25 mg by mouth 3 (three) times daily as needed for anxiety.      LYBALVI 15-10 MG TABS Take 1 tablet by mouth daily. (Patient not taking: Reported on 11/06/2023)       Patient Stressors: Medication change or noncompliance    Patient Strengths: Ability for insight  General fund of knowledge  Supportive family/friends   Treatment Modalities: Medication Management, Group therapy, Case management,  1 to 1 session with clinician, Psychoeducation, Recreational therapy.   Physician Treatment Plan for Primary Diagnosis: Bipolar 1 disorder (HCC) Long Term Goal(s):     Short Term Goals:    Medication Management: Evaluate patient's response, side effects, and tolerance of  medication regimen.  Therapeutic Interventions: 1 to 1 sessions, Unit Group sessions and Medication administration.  Evaluation of Outcomes: Not Met  Physician Treatment Plan for Secondary Diagnosis: Principal Problem:   Bipolar 1 disorder (HCC) Active Problems:   Elevated BP without diagnosis of hypertension  Long Term Goal(s):     Short Term Goals:       Medication Management: Evaluate patient's response, side effects, and tolerance of medication regimen.  Therapeutic Interventions: 1 to 1 sessions, Unit Group sessions and Medication administration.  Evaluation of Outcomes: Not Met   RN Treatment Plan for Primary Diagnosis: Bipolar 1 disorder (HCC) Long Term Goal(s): Knowledge of disease and therapeutic regimen to maintain health will improve  Short Term Goals: Ability to demonstrate self-control, Ability to participate in decision making will improve, Ability to verbalize feelings will improve, Ability to disclose and discuss suicidal ideas, Ability to identify and develop effective coping behaviors will improve, and Compliance with prescribed medications will improve  Medication Management: RN will administer medications as ordered by provider, will assess and evaluate patient's response and provide education to patient for prescribed medication. RN will report any adverse and/or side effects to prescribing provider.  Therapeutic Interventions: 1 on 1 counseling sessions, Psychoeducation, Medication administration, Evaluate responses to treatment, Monitor vital signs and CBGs as ordered, Perform/monitor CIWA, COWS, AIMS and Fall Risk screenings as ordered, Perform wound care treatments as ordered.  Evaluation of Outcomes: Not Met   LCSW Treatment Plan for Primary Diagnosis: Bipolar 1 disorder (HCC) Long Term Goal(s): Safe transition to appropriate next level of care at discharge, Engage patient in therapeutic group addressing interpersonal concerns.  Short Term Goals: Engage  patient in aftercare planning with referrals and resources, Increase social support, Increase ability to appropriately verbalize feelings, Increase emotional regulation, Facilitate acceptance of mental health diagnosis and concerns, and Increase skills for wellness and recovery  Therapeutic Interventions: Assess for all discharge needs, 1 to 1 time with Social worker, Explore available resources and support systems, Assess for adequacy in community support network, Educate family and significant other(s) on suicide prevention, Complete Psychosocial Assessment, Interpersonal group therapy.  Evaluation of Outcomes: Not Met   Progress in Treatment: Attending groups: Yes. Participating in groups: Yes. Taking medication as prescribed: Yes. Toleration medication: Yes. Family/Significant other contact made: No, will contact:  once permission has been given Patient understands diagnosis: Yes. Discussing patient identified problems/goals with staff: Yes. Medical problems stabilized or resolved: Yes. Denies suicidal/homicidal ideation: Yes. Issues/concerns per patient self-inventory: No. Other: none  New problem(s) identified: No, Describe:  none  New Short Term/Long Term Goal(s): detox, elimination of symptoms of psychosis, medication management for mood stabilization; elimination of SI thoughts; development of comprehensive mental wellness/sobriety plan.   Patient Goals:  "I just  need to get my meds right"  Discharge Plan or Barriers: CSW to assist pt in development of appropriate discharge plans.  Patient reports that he has therapist and psychiatrist that he would like to continue with treatment.   Reason for Continuation of Hospitalization: Anxiety Depression Medication stabilization Suicidal ideation  Estimated Length of Stay:  1-7 days  Last 3 [place] Suicide Severity Risk Score: Flowsheet Row Admission (Current) from 11/06/2023 in [facility] INPATIENT BE[facility]AL MEDICINE ED from  11/05/2023 in [facility] ED from 05/14/2023 in [facility] Emergency Department at [facility]  C-SSRS RISK CATEGORY High Risk Error: Q7 should not be populated when Q6 is No No Risk       Last PHQ 2/9 Scores:    11/05/2023    6:25 PM 01/01/2019    3:13 PM 08/27/2018    4:22 PM  Depression screen PHQ 2/9  Decreased Interest 1 3 0  Down, Depressed, Hopeless 2 2 0  PHQ - 2 Score 3 5 0  Altered sleeping 2 3   Tired, decreased energy 2 3   Change in appetite 0 2   Feeling bad or failure about yourself  2 1   Trouble concentrating 1 3   Moving slowly or fidgety/restless 1 2   Suicidal thoughts 3 1   PHQ-9 Score 14 20   Difficult doing work/chores Very difficult      Scribe for Treatment Team: Harden Mo, LCSW 11/07/2023 11:54 AM

## 2023-11-07 NOTE — [facility] Suicide Risk Assessment (Addendum)
[facility] Admission Suicide Risk Assessment   Nursing information obtained from:  Patient Demographic factors:  Caucasian, Gay, lesbian, or bisexual orientation, Access to firearms Current Mental Status:  Suicidal ideation indicated by patient Loss Factors:  NA Historical Factors:  Prior suicide attempts Risk Reduction Factors:  Positive social support  Total Time spent with patient: 2 hours Principal Problem: Bipolar 1 disorder (HCC) Diagnosis:  Principal Problem:   Bipolar 1 disorder (HCC) Active Problems:   Elevated BP without diagnosis of hypertension  Subjective Data: 22 year old transgender female on hormone replacement therapy (HRT) presenting with feelings of guilt, remorse, and hopelessness following a relapse on cocaine and THC on October 31. He reports active suicidal ideation with a plan to shoot himself and states he has access to his father's gun, which is stored at his parents' house. He expresses concern about his mental state and inability to manage these feelings independently, stating his therapist advised him to seek additional assistance.The patient denies using cocaine or THC in the past two weeks and denies the use of alcohol or other illicit substances. He states he had been sober for four months before the relapse and reports a previous suicide attempt in 2018 that required an 18-day hospitalization. He describes his current sleep as poor, averaging 2-3 hours per night, and his appetite as limited to snacks without full meals.The patient attributes some of his distress to recent medication adjustments by his psychiatrist, during which his nighttime medications were discontinued. He cannot recall the specific medications involved. He reports feeling "off" since the change. The patient acknowledges ongoing therapy at the [facility] but reports that he had paused sessions for six weeks before returning to therapy today. He is diagnosed with anxiety, depression, and bipolar disorder  and has a history of concussion in 2022, mixed obsessional thoughts and acts, major depressive disorder (MDD), oppositional defiant disorder (ODD), and gender dysphoria. He describes himself as feeling "hopeless" and "like a failure."    CLINICAL FACTORS:   Severe Anxiety and/or Agitation Bipolar Disorder:   Bipolar II Depression:   Comorbid alcohol abuse/dependence Impulsivity Insomnia Alcohol/Substance Abuse/Dependencies Medical Diagnoses and Treatments/Surgeries   Musculoskeletal: Strength & Muscle Tone: within normal limits Gait & Station: normal Patient leans: N/A  Psychiatric Specialty Exam:  Presentation  General Appearance:  Fairly Groomed  Eye Contact: Minimal  Speech: Clear and Coherent; Normal Rate  Speech Volume: Increased  Handedness: Right   Mood and Affect  Mood: Angry; Anxious; Irritable  Affect: Flat   Thought Process  Thought Processes: Coherent  Descriptions of Associations:Intact  Orientation:Full (Time, Place and Person) (and situation)  Thought Content:WDL  History of Schizophrenia/Schizoaffective disorder: none reported Duration of Psychotic Symptoms: none reported Hallucinations:Hallucinations: None  Ideas of Reference:None  Suicidal Thoughts:Suicidal Thoughts: No SI Active Intent and/or Plan: -- (denies)  Homicidal Thoughts:Homicidal Thoughts: No   Sensorium  Memory: Immediate Good; Remote Good  Judgment: Poor  Insight: Poor   Executive Functions  Concentration: Good  Attention Span: Fair  Recall: Good  Fund of Knowledge: Good  Language: Good   Psychomotor Activity  Psychomotor Activity:Psychomotor Activity: Normal   Assets  Assets: Communication Skills; Financial Resources/Insurance; Desire for Improvement; Housing   Sleep  Sleep:Sleep: Good Number of Hours of Sleep: 6    Physical Exam: Physical Exam Vitals and nursing note reviewed.  Constitutional:      Appearance: Normal  appearance.  HENT:     Head: Normocephalic and atraumatic.     Nose: Nose normal.  Pulmonary:  Effort: Pulmonary effort is normal.  Musculoskeletal:        General: Normal range of motion.     Cervical back: Normal range of motion.  Neurological:     General: No focal deficit present.     Mental Status: He is alert and oriented to person, place, and time. Mental status is at baseline.  Psychiatric:        Attention and Perception: Attention and perception normal.        Mood and Affect: Mood is anxious. Affect is flat.        Speech: Speech normal.        Behavior: Behavior is uncooperative and agitated.        Thought Content: Thought content normal.        Cognition and Memory: Cognition and memory normal.        Judgment: Judgment is impulsive.    Review of Systems  Psychiatric/Behavioral:  Positive for substance abuse. The patient is nervous/anxious.   All other systems reviewed and are negative.  Blood pressure 136/84, pulse 95, temperature 98.4 F (36.9 C), resp. rate 17, height 5\' 5"  (1.651 m), weight 118.4 kg, SpO2 100%. Body mass index is 43.43 kg/m.   COGNITIVE FEATURES THAT CONTRIBUTE TO RISK:  None    SUICIDE RISK:   Minimal: No identifiable suicidal ideation.  Patients presenting with no risk factors but with morbid ruminations; may be classified as minimal risk based on the severity of the depressive symptoms  PLAN OF CARE: Albuterol (Ventolin HFA) 108 MCG/ACT inhaler 2 puffs Q4H PRN Indication: For acute asthma symptoms or respiratory distress.. Divalproex ER (Depakote ER) 500 mg oral QHS Indication: For mood stabilization and management of bipolar disorder. Hydroxyzine (Atarax) 50 mg oral Q6H PRN indication: For anxiety management or sedation. .Mometasone-Formoterol (Dulera) 200-5 MCG/ACT inhaler 2 puffs BID Indication: For respiratory maintenance in asthma or chronic obstructive pulmonary disease (COPD). Nicotine Patch 21 mg transdermal  daily Indication: For smoking cessation and nicotine withdrawal management. Nicorette Gum 4 mg oral Q4H while awake Indication: For smoking cessation and to manage nicotine cravings. Topiramate (Topamax) 100 mg oral BID indication: For mood stabilization and cravings management, particularly in dual-diagnosis patients. Trazodone (Desyrel) 100 mg oral QHS PRN Indication: For sleep or insomnia management. Discontinue Gabapentin as per patient request. Monitor for changes in mood or anxiety following discontinuation. Discontinue Gabapentin as per patient request. Monitor for changes in mood or anxiety following discontinuation. Arrange follow-up appointments with the [facility] for outpatient therapy and psychiatry. Collaborate with LCSW to address housing, work stressors, and access to ongoing support.   I certify that inpatient services furnished can reasonably be expected to improve the patient's condition.   Myriam Forehand, NP 11/07/2023, 2:55 PM

## 2023-11-07 NOTE — Group Note (Signed)
Date:  11/07/2023 Time:  10:07 AM  Group Topic/Focus:  Goals Group:   The focus of this group is to help patients establish daily goals to achieve during treatment and discuss how the patient can incorporate goal setting into their daily lives to aide in recovery.    Participation Level:  Did Not Attend   Lynelle Smoke [facility] 11/07/2023, 10:07 AM

## 2023-11-07 NOTE — Progress Notes (Signed)
   11/07/23 1610  Provider Notification  Provider Name/Title Earlene Plater, NP  Date Provider Notified 11/07/23  Time Provider Notified 0901  Method of Notification Face-to-face  Notification Reason Red med refusal

## 2023-11-07 NOTE — Progress Notes (Signed)
   11/07/23 0800  Psych Admission Type (Psych Patients Only)  Admission Status Involuntary  Psychosocial Assessment  Patient Complaints Anxiety;Depression;Self-harm thoughts  Eye Contact Fair  Facial Expression Anxious  Affect Appropriate to circumstance  Speech Logical/coherent  Interaction Assertive;Needy  Motor Activity Slow  Appearance/Hygiene Layered clothes;Unremarkable  Behavior Characteristics Cooperative;Appropriate to situation  Mood Preoccupied;Pleasant  Aggressive Behavior  Effect No apparent injury  Thought Process  Coherency Circumstantial  Content WDL  Delusions None reported or observed  Perception WDL  Hallucination None reported or observed  Judgment WDL  Confusion None  Danger to Self  Current suicidal ideation? Passive  Self-Injurious Behavior No self-injurious ideation or behavior indicators observed or expressed   Agreement Not to Harm Self Yes  Description of Agreement Verbal  Danger to Others  Danger to Others None reported or observed

## 2023-11-07 NOTE — Plan of Care (Signed)

## 2023-11-07 NOTE — [facility] Suicide Risk Assessment (Signed)
[facility] INPATIENT:  Family/Significant Other Suicide Prevention Education  Suicide Prevention Education:  Patient Refusal for Family/Significant Other Suicide Prevention Education: The patient Samantha Hunter has refused to provide written consent for family/significant other to be provided Family/Significant Other Suicide Prevention Education during admission and/or prior to discharge.  Physician notified.   SPE completed with pt, as pt refused to consent to family contact. SPI pamphlet provided to pt and pt was encouraged to share information with support network, ask questions, and talk about any concerns relating to SPE. Pt denies access to guns/firearms and verbalized understanding of information provided. Mobile Crisis information also provided to pt.    Harden Mo 11/07/2023, 3:52 PM

## 2023-11-07 NOTE — Progress Notes (Signed)
Patient refused scheduled Gabapentin, stating that he told the doctor yesterday that he wasn't taking it. Patient also stated that this is due to him taking "a bunch of it after I relapsed and my doctor took me off of it". Patient also refused scheduled Dulera inhaler reporting that he hasn't used it in about two years and that it should be as needed. Providers will be notified during progression rounds.

## 2023-11-07 NOTE — Group Note (Signed)
Recreation Therapy Group Note   Group Topic:Emotion Expression  Group Date: 11/07/2023 Start Time: 1045 End Time: 1135 Facilitators: Rosina Lowenstein, LRT, CTRS Location:  Craft Room  Group Description: Gratitude Journaling. Patients and LRT discussed what gratitude means, how we can express it and what it means to [place], personally. LRT gave an education handout on the definition of gratitude that also gave different examples of gratitude exercises that they could try. One of the examples was "Gratitude Letter", which prompted patient to write a letter to someone they appreciate. LRT played soft music while everyone wrote their letter. Once letter was completed, LRT encouraged people to read their letter if they wanted to; or share who they wrote it to, at minimum. LRT and pts talked about the benefits of journaling and how it can be used as a positive coping skill. LRT and pts processed how showing gratitude towards themselves, and others can be applied to everyday life post-discharge. LRT offered journals to pts afterwards.   Goal Area(s) Addressed:  Patient will identify the definition of gratitude. Patient will learn different gratitude exercises. Patient will practice writing/journaling as a coping skill.  Patient will identify a new coping skill.   Affect/Mood: N/A   Participation Level: Did not attend    Clinical Observations/Individualized Feedback: Samantha Hunter did not attend group.  Plan: Continue to engage patient in RT group sessions 2-3x/week.   Rosina Lowenstein, LRT, CTRS 11/07/2023 11:54 AM

## 2023-11-07 NOTE — Group Note (Signed)
Date:  11/07/2023 Time:  5:22 PM  Group Topic/Focus:  Self Care:   The focus of this group is to help patients understand the importance of self-care in order to improve or restore emotional, physical, spiritual, interpersonal, and financial health.      Participation Level:  Did Not Attend   Lynelle Smoke [facility] 11/07/2023, 5:22 PM

## 2023-11-07 NOTE — H&P (Signed)
Psychiatric Admission Assessment Adult  Patient Identification: Samantha Hunter MRN:  [id number] Date of Evaluation:  11/07/2023 Chief Complaint:  Bipolar 1 disorder (HCC) [F31.9] Principal Diagnosis: Bipolar 1 disorder (HCC) Diagnosis:  Principal Problem:   Bipolar 1 disorder (HCC) Active Problems:   Elevated BP without diagnosis of hypertension  History of Present Illness: 22 year old transgender female currently on hormone replacement therapy (HRT) who presented for evaluation following a relapse on cocaine and THC on October 31. He reports feelings of guilt, remorse, hopelessness, and active suicidal ideation with a plan to shoot himself. The patient states he has access to firearms stored at his father's house, though he currently lives with roommates. He reports he has not used cocaine or THC in two weeks and denies the use of other illicit drugs or alcohol. The patient attributes his current distress to his recent relapse and changes in his psychiatric medications, stating his psychiatrist discontinued all his nighttime medications, leaving him feeling "off." He cannot recall the names of the medications. The patient has a history of anxiety, depression, and bipolar disorder, as well as a prior suicide attempt in 2018 by medication overdose, which required an 18-day inpatient psychiatric hospitalization. He reports poor sleep (2-3 hours per night), poor appetite limited to snacking, and a sense of failure despite maintaining sobriety for four months before the relapse. The patient recently resumed therapy at the [facility] after a six-week hiatus, following a recommendation from his therapist to seek additional help.During admission, the patient became visibly agitated about being placed on Involuntary Commitment (IVC) status, expressing dissatisfaction with facility rules Associated Signs/Symptoms: Depression Symptoms:  insomnia, fatigue, feelings of  worthlessness/guilt, hopelessness, anxiety, loss of energy/fatigue, disturbed sleep, (Hypo) Manic Symptoms:  Irritable Mood, Anxiety Symptoms:  Excessive Worry, Psychotic Symptoms:   none PTSD Symptoms: Negative Total Time spent with patient: 2 hours  Past Psychiatric History: Anxiety Polysubstance Abuse  Is the patient at risk to self? No.  Has the patient been a risk to self in the past 6 months? Yes.    Has the patient been a risk to self within the distant past? Yes.    Is the patient a risk to others? No.  Has the patient been a risk to others in the past 6 months? No.  Has the patient been a risk to others within the distant past? No.   [place] Scale:  Flowsheet Row Admission (Current) from 11/06/2023 in [facility] INPATIENT BEHAV[facility]AL MEDICINE ED from 11/05/2023 in [facility] ED from 05/14/2023 in [facility] Emergency Department at [facility]  C-SSRS RISK CATEGORY High Risk Error: Q7 should not be populated when Q6 is No No Risk        Prior Inpatient Therapy: Yes.   If yes, describe admision to inpatient  Prior Outpatient Therapy: Yes.   If yes, describe IOP   Alcohol Screening: 1. How often do you have a drink containing alcohol?: Never 2. How many drinks containing alcohol do you have on a typical day when you are drinking?: 1 or 2 3. How often do you have six or more drinks on one occasion?: Never AUDIT-C Score: 0 4. How often during the last year have you found that you were not able to stop drinking once you had started?: Never 5. How often during the last year have you failed to do what was normally expected from you because of drinking?: Never 6. How often during the last year have you needed a first drink in the  morning to get yourself going after a heavy drinking session?: Never 7. How often during the last year have you had a feeling of guilt of remorse after drinking?: Never 8. How often during the last year have you been  unable to remember what happened the night before because you had been drinking?: Never 9. Have you or someone else been injured as a result of your drinking?: No 10. Has a relative or friend or a doctor or another health worker been concerned about your drinking or suggested you cut down?: No Alcohol Use Disorder Identification Test Final Score (AUDIT): 0 Substance Abuse History in the last 12 months:  Yes.   Consequences of Substance Abuse: guilt Previous Psychotropic Medications: Yes  Psychological Evaluations: Yes  Past Medical History:  Past Medical History:  Diagnosis Date   Allergy    Anemia    Anxiety    Asthma    Cannabis use disorder, mild, abuse 03/16/2017   Depression    Gender dysphoria 05/12/2017   GERD (gastroesophageal reflux disease)    Non-suicidal self harm 03/16/2017   OCD (obsessive compulsive disorder) 03/16/2017   Suicidal ideation 03/16/2017    Past Surgical History:  Procedure Laterality Date   wisdom tooth removal Bilateral    Family History:  Family History  Problem Relation Age of Onset   Mental illness Mother    Alcohol abuse Mother    Drug abuse Mother    Depression Mother    Hyperlipidemia Mother    Mental illness Father    Alcohol abuse Father    Drug abuse Father    Depression Father    Cancer Father        skin   Heart disease Father    Hyperlipidemia Father    Stroke Father    Mental illness Brother    Anxiety disorder Brother    Depression Brother    Anxiety disorder Sister    Depression Sister    Mental illness Sister    Alcohol abuse Maternal Uncle    Drug abuse Maternal Uncle    Alcohol abuse Maternal Grandfather    Mental illness Brother    Mental illness Sister    Family Psychiatric  History: see above Tobacco Screening:  Social History   Tobacco Use  Smoking Status Every Day   Current packs/day: 0.25   Average packs/day: 0.3 packs/day for 5.9 years (1.5 ttl pk-yrs)   Types: Cigarettes   Start date: 02/08/2017   Last  attempt to quit: 02/08/2018  Smokeless Tobacco Former   Types: Chew  Tobacco Comments   Declined cessation    BH Tobacco Counseling     Are you interested in Tobacco Cessation Medications?  Yes, implement Nicotene Replacement Protocol Counseled patient on smoking cessation:  Refused/Declined practical counseling Reason Tobacco Screening Not Completed: Patient Refused Screening       Social History:  Social History   Substance and Sexual Activity  Alcohol Use Not Currently     Social History   Substance and Sexual Activity  Drug Use Not Currently   Frequency: 14.0 times per week   Types: Marijuana, Cocaine   Comment: "Coke, weed, and xanax"    Additional Social History:                           Allergies:   Allergies  Allergen Reactions   Coconut (Cocos Nucifera) Itching   Pineapple Hives and Itching   Lab Results:  Results for  orders placed or performed during the hospital encounter of 11/05/23 (from the past 48 hour(s))  POCT Urine Drug Screen - (I-Screen)     Status: None   Collection Time: 11/05/23  8:01 PM  Result Value Ref Range   POC Amphetamine UR None Detected NONE DETECTED (Cut Off Level 1000 ng/mL)   POC Secobarbital (BAR) None Detected NONE DETECTED (Cut Off Level 300 ng/mL)   POC Buprenorphine (BUP) None Detected NONE DETECTED (Cut Off Level 10 ng/mL)   POC Oxazepam (BZO) None Detected NONE DETECTED (Cut Off Level 300 ng/mL)   POC Cocaine UR None Detected NONE DETECTED (Cut Off Level 300 ng/mL)   POC Methamphetamine UR None Detected NONE DETECTED (Cut Off Level 1000 ng/mL)   POC Morphine None Detected NONE DETECTED (Cut Off Level 300 ng/mL)   POC Methadone UR None Detected NONE DETECTED (Cut Off Level 300 ng/mL)   POC Oxycodone UR None Detected NONE DETECTED (Cut Off Level 100 ng/mL)   POC Marijuana UR None Detected NONE DETECTED (Cut Off Level 50 ng/mL)  POC urine preg, ED     Status: None   Collection Time: 11/05/23  8:03 PM  Result Value  Ref Range   Preg Test, Ur Negative Negative  Pregnancy, urine POC     Status: None   Collection Time: 11/05/23  8:06 PM  Result Value Ref Range   Preg Test, Ur NEGATIVE NEGATIVE    Comment:        THE SENSITIVITY OF THIS METHODOLOGY IS >24 mIU/mL   Ethanol     Status: None   Collection Time: 11/05/23 10:21 PM  Result Value Ref Range   Alcohol, Ethyl (B) <10 <10 mg/dL    Comment: (NOTE) Lowest detectable limit for serum alcohol is 10 mg/dL.  For medical purposes only. Performed at [facility], [address] [place], [place] [address]   Comprehensive metabolic panel     Status: None   Collection Time: 11/05/23 10:22 PM  Result Value Ref Range   Sodium 136 135 - 145 mmol/L   Potassium 3.7 3.5 - 5.1 mmol/L   Chloride 104 98 - 111 mmol/L   CO2 23 22 - 32 mmol/L   Glucose, Bld 84 70 - 99 mg/dL    Comment: Glucose reference range applies only to samples taken after fasting for at least 8 hours.   BUN 12 6 - 20 mg/dL   Creatinine, Ser 2.13 0.44 - 1.00 mg/dL   Calcium 9.3 8.9 - 08.6 mg/dL   Total Protein 7.4 6.5 - 8.1 g/dL   Albumin 4.0 3.5 - 5.0 g/dL   AST 15 15 - 41 U/L   ALT 17 0 - 44 U/L   Alkaline Phosphatase 43 38 - 126 U/L   Total Bilirubin 0.6 <1.2 mg/dL   GFR, Estimated >57 >84 mL/min    Comment: (NOTE) Calculated using the CKD-EPI Creatinine Equation (2021)    Anion gap 9 5 - 15    Comment: Performed at [facility], [address] [place], [place] [address]  CBC with Differential/Platelet     Status: None   Collection Time: 11/05/23 10:22 PM  Result Value Ref Range   WBC 7.7 4.0 - 10.5 K/uL   RBC 4.82 3.87 - 5.11 MIL/uL   Hemoglobin 14.5 12.0 - 15.0 g/dL   HCT 52.8 41.3 - 24.4 %   MCV 90.0 80.0 - 100.0 fL   MCH 30.1 26.0 - 34.0 pg   MCHC 33.4 30.0 -  36.0 g/dL   RDW 16.1 09.6 - 04.5 %   Platelets 348 150 - 400 K/uL   nRBC 0.0 0.0 - 0.2 %   Neutrophils Relative % 60 %   Neutro Abs 4.6 1.7 - 7.7 K/uL   Lymphocytes Relative 31 %   Lymphs Abs  2.4 0.7 - 4.0 K/uL   Monocytes Relative 6 %   Monocytes Absolute 0.5 0.1 - 1.0 K/uL   Eosinophils Relative 2 %   Eosinophils Absolute 0.2 0.0 - 0.5 K/uL   Basophils Relative 1 %   Basophils Absolute 0.1 0.0 - 0.1 K/uL   Immature Granulocytes 0 %   Abs Immature Granulocytes 0.01 0.00 - 0.07 K/uL    Comment: Performed at [facility], [address], [place], [place] [address]  Magnesium     Status: None   Collection Time: 11/05/23 10:22 PM  Result Value Ref Range   Magnesium 2.1 1.7 - 2.4 mg/dL    Comment: Performed at [facility], [address], [place], [place] [address]  Valproic acid level     Status: Abnormal   Collection Time: 11/05/23 10:22 PM  Result Value Ref Range   Valproic Acid Lvl 17 (L) 50.0 - 100.0 ug/mL    Comment: Performed at [facility], [address], [place], [place] [address]  Hemoglobin A1c     Status: None   Collection Time: 11/05/23 10:23 PM  Result Value Ref Range   Hgb A1c MFr Bld 5.5 4.8 - 5.6 %    Comment: (NOTE) Pre diabetes:          5.7%-6.4%  Diabetes:              >6.4%  Glycemic control for   <7.0% adults with diabetes    Mean Plasma Glucose 111.15 mg/dL    Comment: Performed at [facility], [address], [place], [place] [address]  Lipid panel     Status: Abnormal   Collection Time: 11/05/23 10:23 PM  Result Value Ref Range   Cholesterol 199 0 - 200 mg/dL   Triglycerides 80 <657 mg/dL   HDL 57 >84 mg/dL   Total CHOL/HDL Ratio 3.5 RATIO   VLDL 16 0 - 40 mg/dL   LDL Cholesterol 696 (H) 0 - 99 mg/dL    Comment:        Total Cholesterol/HDL:CHD Risk Coronary Heart Disease Risk Table                     Men   Women  1/2 Average Risk   3.4   3.3  Average Risk       5.0   4.4  2 X Average Risk   9.6   7.1  3 X Average Risk  23.4   11.0        Use the calculated Patient Ratio above and the CHD Risk Table to determine the patient's CHD Risk.        ATP III CLASSIFICATION (LDL):  <100     mg/dL    Optimal  295-284  mg/dL   Near or Above                    Optimal  130-159  mg/dL   Borderline  132-440  mg/dL   High  >102     mg/dL   Very High Performed at [facility], [address] [place], [place] [address]   TSH  Status: None   Collection Time: 11/05/23 10:23 PM  Result Value Ref Range   TSH 1.187 0.350 - 4.500 uIU/mL    Comment: Performed by a 3rd Generation assay with a functional sensitivity of <=0.01 uIU/mL. Performed at [facility], [address], [place], [place] [address]     Blood Alcohol level:  Lab Results  Component Value Date   [facility] <10 11/05/2023   ETH <5 05/11/2017    Metabolic Disorder Labs:  Lab Results  Component Value Date   HGBA1C 5.5 11/05/2023   MPG 111.15 11/05/2023   MPG 108 06/02/2017   No results found for: "PROLACTIN" Lab Results  Component Value Date   CHOL 199 11/05/2023   TRIG 80 11/05/2023   HDL 57 11/05/2023   CHOLHDL 3.5 11/05/2023   VLDL 16 11/05/2023   LDLCALC 126 (H) 11/05/2023   LDLCALC 99 06/02/2017    Current Medications: Current Facility-Administered Medications  Medication Dose Route Frequency Provider Last Rate Last Admin   acetaminophen (TYLENOL) tablet 650 mg  650 mg Oral Q6H PRN Motley-Mangrum, Jadeka A, PMHNP       albuterol (VENTOLIN HFA) 108 (90 Base) MCG/ACT inhaler 2 puff  2 puff Inhalation Q4H PRN Motley-Mangrum, Jadeka A, PMHNP       alum & mag hydroxide-simeth (MAALOX/MYLANTA) 200-200-20 MG/5ML suspension 30 mL  30 mL Oral Q4H PRN Motley-Mangrum, Jadeka A, PMHNP       haloperidol lactate (HALDOL) injection 5 mg  5 mg Intramuscular TID PRN Motley-Mangrum, Jadeka A, PMHNP       And   diphenhydrAMINE (BENADRYL) injection 50 mg  50 mg Intramuscular TID PRN Motley-Mangrum, Jadeka A, PMHNP       And   LORazepam (ATIVAN) injection 2 mg  2 mg Intramuscular TID PRN Motley-Mangrum, Jadeka A, PMHNP       divalproex (DEPAKOTE ER) 24 hr tablet 500 mg  500 mg Oral QHS Motley-Mangrum, Jadeka A, PMHNP    500 mg at 11/06/23 2111   hydrOXYzine (ATARAX) tablet 50 mg  50 mg Oral Q6H PRN Myriam Forehand, NP   50 mg at 11/07/23 1437   magnesium hydroxide (MILK OF MAGNESIA) suspension 30 mL  30 mL Oral Daily PRN Motley-Mangrum, Jadeka A, PMHNP       mometasone-formoterol (DULERA) 200-5 MCG/ACT inhaler 2 puff  2 puff Inhalation BID Motley-Mangrum, Jadeka A, PMHNP       nicotine (NICODERM CQ - dosed in mg/24 hours) patch 21 mg  21 mg Transdermal Daily Motley-Mangrum, Jadeka A, PMHNP   21 mg at 11/07/23 0803   nicotine polacrilex (NICORETTE) gum 4 mg  4 mg Oral Q4H while awake Myriam Forehand, NP   4 mg at 11/07/23 1300   topiramate (TOPAMAX) tablet 100 mg  100 mg Oral BID Myriam Forehand, NP       traZODone (DESYREL) tablet 100 mg  100 mg Oral QHS PRN Myriam Forehand, NP   100 mg at 11/06/23 2111   PTA Medications: Medications Prior to Admission  Medication Sig Dispense Refill Last Dose   albuterol (PROVENTIL HFA;VENTOLIN HFA) 108 (90 Base) MCG/ACT inhaler Inhale 2 puffs into the lungs every 4 (four) hours as needed for wheezing or shortness of breath (cough, shortness of breath or wheezing.). 3 Inhaler 1    albuterol (PROVENTIL) (2.5 MG/3ML) 0.083% nebulizer solution TAKE 3 MLS BY NEBULIZATION EVERY 6 (SIX) HOURS AS NEEDED FOR WHEEZING OR SHORTNESS OF BREATH 150 mL 1    budesonide-formoterol (SYMBICORT) 160-4.5 MCG/ACT  inhaler Inhale 2 puffs into the lungs 2 (two) times daily. (Patient not taking: Reported on 11/06/2023) 3 Inhaler 2    divalproex (DEPAKOTE ER) 500 MG 24 hr tablet Take 500 mg by mouth at bedtime.      gabapentin (NEURONTIN) 300 MG capsule Take 300 mg by mouth 3 (three) times daily. (Patient not taking: Reported on 11/06/2023)      hydrOXYzine (ATARAX) 25 MG tablet Take 25 mg by mouth 3 (three) times daily as needed for anxiety.      LYBALVI 15-10 MG TABS Take 1 tablet by mouth daily. (Patient not taking: Reported on 11/06/2023)       Musculoskeletal: Strength & Muscle Tone: within normal  limits Gait & Station: normal Patient leans: N/A            Psychiatric Specialty Exam:  Presentation  General Appearance:  Fairly Groomed  Eye Contact: Minimal  Speech: Clear and Coherent; Normal Rate  Speech Volume: Increased  Handedness: Right   Mood and Affect  Mood: Angry; Anxious; Irritable  Affect: Flat   Thought Process  Thought Processes: Coherent  Duration of Psychotic Symptoms: 3 weeks depression symptoms Past Diagnosis of Schizophrenia or Psychoactive disorder: none Descriptions of Associations:Intact  Orientation:Full (Time, Place and Person) (and situation)  Thought Content:WDL  Hallucinations:Hallucinations: None  Ideas of Reference:None  Suicidal Thoughts:Suicidal Thoughts: No SI Active Intent and/or Plan: -- (denies)  Homicidal Thoughts:Homicidal Thoughts: No   Sensorium  Memory: Immediate Good; Remote Good  Judgment: Poor  Insight: Poor   Executive Functions  Concentration: Good  Attention Span: Fair  Recall: Good  Fund of Knowledge: Good  Language: Good   Psychomotor Activity  Psychomotor Activity: Psychomotor Activity: Normal   Assets  Assets: Communication Skills; Financial Resources/Insurance; Desire for Improvement; Housing   Sleep  Sleep: Sleep: Good Number of Hours of Sleep: 6    Physical Exam: Physical Exam Vitals and nursing note reviewed.  Musculoskeletal:        General: Normal range of motion.  Neurological:     General: No focal deficit present.     Mental Status: He is oriented to person, place, and time.  Psychiatric:        Attention and Perception: Attention and perception normal.        Mood and Affect: Mood is anxious. Affect is flat.        Speech: Speech normal.        Behavior: Behavior is agitated and withdrawn.        Thought Content: Thought content normal.        Cognition and Memory: Cognition and memory normal.        Judgment: Judgment is impulsive.     ROS Blood pressure 136/84, pulse 95, temperature 98.4 F (36.9 C), resp. rate 17, height 5\' 5"  (1.651 m), weight 118.4 kg, SpO2 100%. Body mass index is 43.43 kg/m.  Treatment Plan Summary: Daily contact with patient to assess and evaluate symptoms and progress in treatment and Medication management Albuterol (Ventolin HFA) 108 MCG/ACT inhaler 2 puffs Q4H PRN Indication: For acute asthma symptoms or respiratory distress.. Divalproex ER (Depakote ER) 500 mg oral QHS Indication: For mood stabilization and management of bipolar disorder. Hydroxyzine (Atarax) 50 mg oral Q6H PRN indication: For anxiety management or sedation. .Mometasone-Formoterol (Dulera) 200-5 MCG/ACT inhaler 2 puffs BID Indication: For respiratory maintenance in asthma or chronic obstructive pulmonary disease (COPD). Nicotine Patch 21 mg transdermal daily Indication: For smoking cessation and nicotine withdrawal management. Nicorette Gum 4  mg oral Q4H while awake Indication: For smoking cessation and to manage nicotine cravings. Topiramate (Topamax) 100 mg oral BID indication: For mood stabilization and cravings management, particularly in dual-diagnosis patients. Trazodone (Desyrel) 100 mg oral QHS PRN Indication: For sleep or insomnia management. Discontinue Gabapentin as per patient request. Monitor for changes in mood or anxiety following discontinuation. Discontinue Gabapentin as per patient request. Monitor for changes in mood or anxiety following discontinuation. Arrange follow-up appointments with the [facility] for outpatient therapy and psychiatry. Collaborate with LCSW to address housing, work stressors, and access to ongoing support.  Observation Level/Precautions:  Continuous Observation 1 to 1 15 minute checks Seizure  Laboratory:   none indicated  Psychotherapy:    Medications:    Consultations:    Discharge Concerns:    Estimated LOS: 72-96 hours  Other:     Physician Treatment Plan  for Primary Diagnosis: Bipolar 1 disorder (HCC) Long Term Goal(s): Improvement in symptoms so as ready for discharge  Short Term Goals: Ability to identify changes in lifestyle to reduce recurrence of condition will improve, Ability to verbalize feelings will improve, Ability to disclose and discuss suicidal ideas, Ability to demonstrate self-control will improve, Ability to identify and develop effective coping behaviors will improve, Ability to maintain clinical measurements within normal limits will improve, Compliance with prescribed medications will improve, and Ability to identify triggers associated with substance abuse/mental health issues will improve  Physician Treatment Plan for Secondary Diagnosis: Principal Problem:   Bipolar 1 disorder (HCC) Active Problems:   Elevated BP without diagnosis of hypertension  Long Term Goal(s): Improvement in symptoms so as ready for discharge  Short Term Goals: Ability to identify changes in lifestyle to reduce recurrence of condition will improve, Ability to verbalize feelings will improve, Ability to disclose and discuss suicidal ideas, Ability to demonstrate self-control will improve, Ability to identify and develop effective coping behaviors will improve, Ability to maintain clinical measurements within normal limits will improve, Compliance with prescribed medications will improve, and Ability to identify triggers associated with substance abuse/mental health issues will improve  I certify that inpatient services furnished can reasonably be expected to improve the patient's condition.    Myriam Forehand, NP 11/27/20243:36 PM

## 2023-11-08 DIAGNOSIS — F319 Bipolar disorder, unspecified: Secondary | ICD-10-CM | POA: Diagnosis not present

## 2023-11-08 MED ORDER — IBUPROFEN 600 MG PO TABS
800.0000 mg | ORAL_TABLET | Freq: Three times a day (TID) | ORAL | Status: DC | PRN
  Administered 2023-11-08 – 2023-11-11 (×4): 800 mg via ORAL
  Filled 2023-11-08 (×4): qty 1

## 2023-11-08 MED ORDER — BUPROPION HCL 75 MG PO TABS
75.0000 mg | ORAL_TABLET | Freq: Every day | ORAL | Status: DC
  Administered 2023-11-08 – 2023-11-12 (×5): 75 mg via ORAL
  Filled 2023-11-08 (×5): qty 1

## 2023-11-08 NOTE — Group Note (Signed)
Date:  11/08/2023 Time:  7:07 PM  Group Topic/Focus:  Activity Group:  The focus of the group is to promote activity with the patients to encourage them to go outside to the courtyard to get some fresh air and some exercise.     Participation Level:  Did Not Attend   Mary Sella Kalyan Barabas 11/08/2023, 7:07 PM

## 2023-11-08 NOTE — Progress Notes (Signed)
   11/08/23 0900  Psych Admission Type (Psych Patients Only)  Admission Status Involuntary  Psychosocial Assessment  Patient Complaints Anxiety;Depression;Hopelessness;Self-harm thoughts  Eye Contact Fair  Facial Expression Anxious  Affect Preoccupied  Speech Logical/coherent  Interaction Assertive;Demanding;Needy  Motor Activity Slow  Appearance/Hygiene Layered clothes;Unremarkable  Behavior Characteristics Cooperative;Appropriate to situation  Mood Preoccupied;Pleasant  Aggressive Behavior  Effect No apparent injury  Thought Process  Coherency Circumstantial  Content WDL  Delusions None reported or observed  Perception WDL  Hallucination None reported or observed  Judgment WDL  Confusion None  Danger to Self  Current suicidal ideation? Passive  Self-Injurious Behavior Some self-injurious ideation observed or expressed.  No lethal plan expressed   Agreement Not to Harm Self Yes  Description of Agreement Verbal  Danger to Others  Danger to Others None reported or observed

## 2023-11-08 NOTE — Plan of Care (Signed)
Problem: Education: Goal: Mental status will improve Outcome: Progressing   Problem: Activity: Goal: Interest or engagement in activities will improve Outcome: Progressing   Problem: Activity: Goal: Sleeping patterns will improve Outcome: Progressing

## 2023-11-08 NOTE — Progress Notes (Signed)
Nursing Shift Note:  1900-0700  Attended Evening Group: Yes Medication Compliant:  yes Behavior: cooperative; calm Sleep Quality: good Significant Changes: none noted. Pt was active in the milieu with no c/o distress.  Denies SI Hi AVH.  Continued monitoring for safety.    11/08/23 0200  Psych Admission Type (Psych Patients Only)  Admission Status Involuntary  Psychosocial Assessment  Patient Complaints Anxiety  Eye Contact Fair  Facial Expression Anxious  Affect Appropriate to circumstance  Speech Logical/coherent  Interaction Assertive  Motor Activity Slow  Appearance/Hygiene Unremarkable  Behavior Characteristics Cooperative  Mood Preoccupied  Thought Process  Coherency WDL  Content WDL  Delusions None reported or observed  Perception WDL  Hallucination None reported or observed  Judgment WDL  Confusion None  Danger to Self  Current suicidal ideation? Passive  Danger to Others  Danger to Others None reported or observed

## 2023-11-08 NOTE — Plan of Care (Signed)

## 2023-11-08 NOTE — Group Note (Signed)
Date:  11/08/2023 Time:  10:19 AM  Group Topic/Focus:  Goals Group:   The focus of this group is to help patients establish daily goals to achieve during treatment and discuss how the patient can incorporate goal setting into their daily lives to aide in recovery.    Participation Level:  Active  Participation Quality:  Appropriate  Affect:  Appropriate  Cognitive:  Appropriate  Insight: Appropriate  Engagement in Group:  Engaged  Modes of Intervention:  Discussion, Education, and Support  Additional Comments:    Samantha Hunter 11/08/2023, 10:19 AM

## 2023-11-08 NOTE — Progress Notes (Signed)
[facility] MD Progress Note  11/08/2023 11:46 AM Samantha Hunter  MRN:  [id number] Subjective:   22 year old Hispanic transgender female, presents with suicidal ideation, stating, "I want to kill myself, I fell off the wagon, and I was really in my program." He reports recent substance use, including ketamine, fentanyl, and marijuana, expressing feelings of disappointment and shame in himself. The patient disclosed access to firearms in his father's possession, heightening concerns for safety. Additionally, he requested Nicorette gum in the morning due to a history of vaping and related cravings. Principal Problem: Bipolar 1 disorder (HCC) Diagnosis: Principal Problem:   Bipolar 1 disorder (HCC) Active Problems:   Elevated BP without diagnosis of hypertension   Suicidal ideation  Total Time spent with patient: 45 minutes  Past Psychiatric History: Anxiety Gender Dysphoria Polysubstance abuse  Past Medical History:  Past Medical History:  Diagnosis Date   Allergy    Anemia    Anxiety    Asthma    Cannabis use disorder, mild, abuse 03/16/2017   Depression    Gender dysphoria 05/12/2017   GERD (gastroesophageal reflux disease)    Non-suicidal self harm 03/16/2017   OCD (obsessive compulsive disorder) 03/16/2017   Suicidal ideation 03/16/2017    Past Surgical History:  Procedure Laterality Date   wisdom tooth removal Bilateral    Family History:  Family History  Problem Relation Age of Onset   Mental illness Mother    Alcohol abuse Mother    Drug abuse Mother    Depression Mother    Hyperlipidemia Mother    Mental illness Father    Alcohol abuse Father    Drug abuse Father    Depression Father    Cancer Father        skin   Heart disease Father    Hyperlipidemia Father    Stroke Father    Mental illness Brother    Anxiety disorder Brother    Depression Brother    Anxiety disorder Sister    Depression Sister    Mental illness Sister    Alcohol abuse Maternal Uncle    Drug abuse  Maternal Uncle    Alcohol abuse Maternal Grandfather    Mental illness Brother    Mental illness Sister    Family Psychiatric  History: see above Social History:  Social History   Substance and Sexual Activity  Alcohol Use Not Currently     Social History   Substance and Sexual Activity  Drug Use Not Currently   Frequency: 14.0 times per week   Types: Marijuana, Cocaine   Comment: "Coke, weed, and xanax"    Social History   Socioeconomic History   Marital status: Single    Spouse name: Not on file   Number of children: Not on file   Years of education: Not on file   Highest education level: Not on file  Occupational History   Not on file  Tobacco Use   Smoking status: Every Day    Current packs/day: 0.25    Average packs/day: 0.3 packs/day for 5.9 years (1.5 ttl pk-yrs)    Types: Cigarettes    Start date: 02/08/2017    Last attempt to quit: 02/08/2018   Smokeless tobacco: Former    Types: Chew   Tobacco comments:    Declined cessation  Vaping Use   Vaping status: Every Day  Substance and Sexual Activity   Alcohol use: Not Currently   Drug use: Not Currently    Frequency: 14.0 times per week  Types: Marijuana, Cocaine    Comment: "Coke, weed, and xanax"   Sexual activity: Yes    Birth control/protection: Surgical, Other-see comments    Comment: spouse has had vasectomy, pt amenorrheic on testosterone  Other Topics Concern   Not on file  Social History Narrative   Not on file   Social Determinants of Health   Financial Resource Strain: Not on file  Food Insecurity: No Food Insecurity (11/06/2023)   Hunger Vital Sign    Worried About Running Out of Food in the Last Year: Never true    Ran Out of Food in the Last Year: Never true  Transportation Needs: No Transportation Needs (11/06/2023)   PRAPARE - Administrator, Civil Service (Medical): No    Lack of Transportation (Non-Medical): No  Physical Activity: Not on file  Stress: Not on file   Social Connections: Not on file   Additional Social History:                         Sleep: Fair  Appetite:  Good  Current Medications: Current Facility-Administered Medications  Medication Dose Route Frequency Provider Last Rate Last Admin   albuterol (VENTOLIN HFA) 108 (90 Base) MCG/ACT inhaler 2 puff  2 puff Inhalation Q4H PRN Motley-Mangrum, Jadeka A, PMHNP       alum & mag hydroxide-simeth (MAALOX/MYLANTA) 200-200-20 MG/5ML suspension 30 mL  30 mL Oral Q4H PRN Motley-Mangrum, Jadeka A, PMHNP       haloperidol lactate (HALDOL) injection 5 mg  5 mg Intramuscular TID PRN Motley-Mangrum, Jadeka A, PMHNP       And   diphenhydrAMINE (BENADRYL) injection 50 mg  50 mg Intramuscular TID PRN Motley-Mangrum, Jadeka A, PMHNP       And   LORazepam (ATIVAN) injection 2 mg  2 mg Intramuscular TID PRN Motley-Mangrum, Jadeka A, PMHNP       divalproex (DEPAKOTE ER) 24 hr tablet 500 mg  500 mg Oral QHS Motley-Mangrum, Jadeka A, PMHNP   500 mg at 11/07/23 2149   hydrOXYzine (ATARAX) tablet 50 mg  50 mg Oral Q6H PRN Myriam Forehand, NP   50 mg at 11/08/23 0858   ibuprofen (ADVIL) tablet 800 mg  800 mg Oral Q8H PRN Myriam Forehand, NP       magnesium hydroxide (MILK OF MAGNESIA) suspension 30 mL  30 mL Oral Daily PRN Motley-Mangrum, Jadeka A, PMHNP       mometasone-formoterol (DULERA) 200-5 MCG/ACT inhaler 2 puff  2 puff Inhalation BID Motley-Mangrum, Jadeka A, PMHNP       nicotine (NICODERM CQ - dosed in mg/24 hours) patch 21 mg  21 mg Transdermal Daily Motley-Mangrum, Jadeka A, PMHNP   21 mg at 11/08/23 0804   nicotine polacrilex (NICORETTE) gum 4 mg  4 mg Oral PRN Myriam Forehand, NP   4 mg at 11/08/23 1004   topiramate (TOPAMAX) tablet 100 mg  100 mg Oral BID Myriam Forehand, NP   100 mg at 11/08/23 0759   traZODone (DESYREL) tablet 100 mg  100 mg Oral QHS PRN Myriam Forehand, NP   100 mg at 11/07/23 2149    Lab Results: No results found for this or any previous visit (from the past 48  hour(s)).  Blood Alcohol level:  Lab Results  Component Value Date   [facility] <10 11/05/2023   ETH <5 05/11/2017    Metabolic Disorder Labs: Lab Results  Component Value Date   HGBA1C  5.5 11/05/2023   MPG 111.15 11/05/2023   MPG 108 06/02/2017   No results found for: "PROLACTIN" Lab Results  Component Value Date   CHOL 199 11/05/2023   TRIG 80 11/05/2023   HDL 57 11/05/2023   CHOLHDL 3.5 11/05/2023   VLDL 16 11/05/2023   LDLCALC 126 (H) 11/05/2023   LDLCALC 99 06/02/2017    Physical Findings: AIMS:  , ,  ,  ,    CIWA:    COWS:     Musculoskeletal: Strength & Muscle Tone: within normal limits Gait & Station: normal Patient leans: N/A  Psychiatric Specialty Exam:  Presentation  General Appearance:  Fairly Groomed  Eye Contact: Minimal  Speech: Clear and Coherent; Normal Rate  Speech Volume: Normal  Handedness: Right   Mood and Affect  Mood: Depressed; Irritable  Affect: Depressed   Thought Process  Thought Processes: Coherent  Descriptions of Associations:Intact  Orientation:-- (and situation)  Thought Content:WDL  History of Schizophrenia/Schizoaffective disorder:No data recorded Duration of Psychotic Symptoms:No data recorded Hallucinations:Hallucinations: Auditory Description of Auditory Hallucinations: command to "kill myself"  Ideas of Reference:None  Suicidal Thoughts:Suicidal Thoughts: Yes, Passive SI Active Intent and/or Plan: Without Intent; With Plan; With Access to Means (access to father's guns no supervision) SI Passive Intent and/or Plan: With Intent; With Plan; With Access to Means  Homicidal Thoughts:Homicidal Thoughts: No   Sensorium  Memory: Immediate Good; Remote Good  Judgment: Poor  Insight: Poor   Executive Functions  Concentration: Good  Attention Span: Poor  Recall: Good  Fund of Knowledge: Good  Language: Good   Psychomotor Activity  Psychomotor Activity: Psychomotor Activity:  Normal   Assets  Assets: Communication Skills; Desire for Improvement; Resilience; Financial Resources/Insurance   Sleep  Sleep: Sleep: Fair Number of Hours of Sleep: 4    Physical Exam: Physical Exam Vitals and nursing note reviewed.  Constitutional:      Appearance: Normal appearance.  HENT:     Head: Normocephalic and atraumatic.     Nose: Nose normal.  Pulmonary:     Effort: Pulmonary effort is normal.  Musculoskeletal:        General: Normal range of motion.     Cervical back: Normal range of motion.  Neurological:     General: No focal deficit present.     Mental Status: He is alert and oriented to person, place, and time. Mental status is at baseline.  Psychiatric:        Attention and Perception: Attention normal. He perceives auditory hallucinations.        Mood and Affect: Mood is anxious. Affect is labile.        Speech: Speech normal.        Behavior: Behavior normal. Behavior is cooperative.        Thought Content: Thought content includes suicidal ideation. Thought content includes suicidal plan.        Cognition and Memory: Cognition and memory normal.        Judgment: Judgment is impulsive.    Review of Systems  Psychiatric/Behavioral:  Positive for depression, substance abuse and suicidal ideas. The patient is nervous/anxious and has insomnia.    Blood pressure 106/78, pulse (!) 104, temperature 98.6 F (37 C), resp. rate 17, height 5\' 5"  (1.651 m), weight 118.4 kg, SpO2 98%. Body mass index is 43.43 kg/m.   Treatment Plan Summary: Daily contact with patient to assess and evaluate symptoms and progress in treatment and Medication management Refer to LCSW for therapy focused on relapse prevention,  coping skills, and safety planning Reinforce coping strategies, emphasizing "one day at a time" to manage feelings of disappointment and foster hope. Discuss the importance of consistent engagement with a recovery program and therapy. Divalproex ER  (Depakote ER) 500 mg oral at bedtime Mood stabilization and management of bipolar disorder Topiramate (Topamax) 100 mg oral BID Adjunctive therapy for mood stabilization and possibly for reducing cravings or supporting mental clarity in bipolar disorder or substance use disorders. Consideriing antidepressant for ideation Myriam Forehand, NP 11/08/2023, 11:46 AM

## 2023-11-09 ENCOUNTER — Other Ambulatory Visit: Payer: Self-pay

## 2023-11-09 DIAGNOSIS — F319 Bipolar disorder, unspecified: Secondary | ICD-10-CM | POA: Diagnosis not present

## 2023-11-09 MED ORDER — WIXELA INHUB 500-50 MCG/ACT IN AEPB
1.0000 | INHALATION_SPRAY | Freq: Two times a day (BID) | RESPIRATORY_TRACT | 0 refills | Status: DC
  Filled 2023-11-09: qty 60, 30d supply, fill #0

## 2023-11-09 MED ORDER — NICOTINE POLACRILEX 4 MG MT GUM
4.0000 mg | CHEWING_GUM | OROMUCOSAL | 0 refills | Status: AC | PRN
  Filled 2023-11-09: qty 50, 5d supply, fill #0

## 2023-11-09 MED ORDER — TOPIRAMATE 100 MG PO TABS
100.0000 mg | ORAL_TABLET | Freq: Two times a day (BID) | ORAL | 0 refills | Status: DC
  Filled 2023-11-09: qty 20, 10d supply, fill #0

## 2023-11-09 MED ORDER — BUPROPION HCL 75 MG PO TABS
75.0000 mg | ORAL_TABLET | Freq: Every day | ORAL | 0 refills | Status: DC
  Filled 2023-11-09: qty 10, 10d supply, fill #0

## 2023-11-09 MED ORDER — HYDROXYZINE HCL 50 MG PO TABS
50.0000 mg | ORAL_TABLET | Freq: Three times a day (TID) | ORAL | 0 refills | Status: AC | PRN
  Filled 2023-11-09: qty 30, 10d supply, fill #0

## 2023-11-09 MED ORDER — LORAZEPAM 2 MG PO TABS
2.0000 mg | ORAL_TABLET | Freq: Once | ORAL | Status: AC
  Administered 2023-11-09: 2 mg via ORAL
  Filled 2023-11-09: qty 1

## 2023-11-09 MED ORDER — TRAZODONE HCL 100 MG PO TABS
100.0000 mg | ORAL_TABLET | Freq: Every evening | ORAL | 0 refills | Status: DC | PRN
  Filled 2023-11-09: qty 10, 10d supply, fill #0

## 2023-11-09 MED ORDER — IBUPROFEN 800 MG PO TABS
800.0000 mg | ORAL_TABLET | Freq: Three times a day (TID) | ORAL | 0 refills | Status: DC | PRN
  Filled 2023-11-09: qty 30, 10d supply, fill #0

## 2023-11-09 MED ORDER — NICOTINE 21 MG/24HR TD PT24
21.0000 mg | MEDICATED_PATCH | Freq: Every day | TRANSDERMAL | 0 refills | Status: AC
  Filled 2023-11-09: qty 14, 14d supply, fill #0

## 2023-11-09 NOTE — Group Note (Signed)
Date:  11/09/2023 Time:  5:24 PM  Group Topic/Focus:  Crisis Planning:   The purpose of this group is to help patients create a crisis plan for use upon discharge or in the future, as needed.    Participation Level:  Active  Participation Quality:  Appropriate  Affect:  Appropriate  Cognitive:  Appropriate  Insight: Appropriate  Engagement in Group:  Engaged  Modes of Intervention:  Activity  Additional Comments:    Samantha Hunter 11/09/2023, 5:24 PM

## 2023-11-09 NOTE — Plan of Care (Signed)

## 2023-11-09 NOTE — Progress Notes (Signed)
[facility] MD Progress Note  11/09/2023 1:11 PM Samantha Hunter  MRN:  [id number] Subjective:  22 year old transgender Hispanic female, reports, "I can't take it anymore" and "I am getting overwhelmed." He describes feeling extremely anxious and states, "I am having a panic attack." The patient acknowledged feeling overwhelmed and sought assistance during the assessment. He expressed concerns about his current state but agreed to intervention after discussing the length of stay (LOS). The patient contacted his sponsor to discuss the use of medication and agreed to take it.The patient is experiencing acute anxiety and panic symptoms. He engaged in collaborative care, including consulting his sponsor about medication. No signs of suicidal ideation (SI), homicidal ideation (HI), or psychosis were observed during the assessment. Principal Problem: Bipolar 1 disorder (HCC) Diagnosis: Principal Problem:   Bipolar 1 disorder (HCC) Active Problems:   Elevated BP without diagnosis of hypertension   Suicidal ideation  Total Time spent with patient: 1 hour  Past Psychiatric History: Anxiety Gender Dysphoria Polysubstance abuse    Past Medical History:  Past Medical History:  Diagnosis Date   Allergy    Anemia    Anxiety    Asthma    Cannabis use disorder, mild, abuse 03/16/2017   Depression    Gender dysphoria 05/12/2017   GERD (gastroesophageal reflux disease)    Non-suicidal self harm 03/16/2017   OCD (obsessive compulsive disorder) 03/16/2017   Suicidal ideation 03/16/2017    Past Surgical History:  Procedure Laterality Date   wisdom tooth removal Bilateral    Family History:  Family History  Problem Relation Age of Onset   Mental illness Mother    Alcohol abuse Mother    Drug abuse Mother    Depression Mother    Hyperlipidemia Mother    Mental illness Father    Alcohol abuse Father    Drug abuse Father    Depression Father    Cancer Father        skin   Heart disease Father     Hyperlipidemia Father    Stroke Father    Mental illness Brother    Anxiety disorder Brother    Depression Brother    Anxiety disorder Sister    Depression Sister    Mental illness Sister    Alcohol abuse Maternal Uncle    Drug abuse Maternal Uncle    Alcohol abuse Maternal Grandfather    Mental illness Brother    Mental illness Sister    Family Psychiatric  History: see above Social History:  Social History   Substance and Sexual Activity  Alcohol Use Not Currently     Social History   Substance and Sexual Activity  Drug Use Not Currently   Frequency: 14.0 times per week   Types: Marijuana, Cocaine   Comment: "Coke, weed, and xanax"    Social History   Socioeconomic History   Marital status: Single    Spouse name: Not on file   Number of children: Not on file   Years of education: Not on file   Highest education level: Not on file  Occupational History   Not on file  Tobacco Use   Smoking status: Every Day    Current packs/day: 0.25    Average packs/day: 0.3 packs/day for 5.9 years (1.5 ttl pk-yrs)    Types: Cigarettes    Start date: 02/08/2017    Last attempt to quit: 02/08/2018   Smokeless tobacco: Former    Types: Chew   Tobacco comments:    Declined cessation  Vaping  Use   Vaping status: Every Day  Substance and Sexual Activity   Alcohol use: Not Currently   Drug use: Not Currently    Frequency: 14.0 times per week    Types: Marijuana, Cocaine    Comment: "Coke, weed, and xanax"   Sexual activity: Yes    Birth control/protection: Surgical, Other-see comments    Comment: spouse has had vasectomy, pt amenorrheic on testosterone  Other Topics Concern   Not on file  Social History Narrative   Not on file   Social Determinants of Health   Financial Resource Strain: Not on file  Food Insecurity: No Food Insecurity (11/06/2023)   Hunger Vital Sign    Worried About Running Out of Food in the Last Year: Never true    Ran Out of Food in the Last Year:  Never true  Transportation Needs: No Transportation Needs (11/06/2023)   PRAPARE - Administrator, Civil Service (Medical): No    Lack of Transportation (Non-Medical): No  Physical Activity: Not on file  Stress: Not on file  Social Connections: Not on file   Additional Social History:                         Sleep: Good  Appetite:  Good  Current Medications: Current Facility-Administered Medications  Medication Dose Route Frequency Provider Last Rate Last Admin   albuterol (VENTOLIN HFA) 108 (90 Base) MCG/ACT inhaler 2 puff  2 puff Inhalation Q4H PRN Motley-Mangrum, Jadeka A, PMHNP       alum & mag hydroxide-simeth (MAALOX/MYLANTA) 200-200-20 MG/5ML suspension 30 mL  30 mL Oral Q4H PRN Motley-Mangrum, Geralynn Ochs A, PMHNP       buPROPion (WELLBUTRIN) tablet 75 mg  75 mg Oral Daily Myriam Forehand, NP   75 mg at 11/09/23 0841   divalproex (DEPAKOTE ER) 24 hr tablet 500 mg  500 mg Oral QHS Motley-Mangrum, Jadeka A, PMHNP   500 mg at 11/08/23 2105   hydrOXYzine (ATARAX) tablet 50 mg  50 mg Oral Q6H PRN Myriam Forehand, NP   50 mg at 11/09/23 1006   ibuprofen (ADVIL) tablet 800 mg  800 mg Oral Q8H PRN Myriam Forehand, NP   800 mg at 11/09/23 1251   magnesium hydroxide (MILK OF MAGNESIA) suspension 30 mL  30 mL Oral Daily PRN Motley-Mangrum, Jadeka A, PMHNP       mometasone-formoterol (DULERA) 200-5 MCG/ACT inhaler 2 puff  2 puff Inhalation BID Motley-Mangrum, Jadeka A, PMHNP       nicotine (NICODERM CQ - dosed in mg/24 hours) patch 21 mg  21 mg Transdermal Daily Motley-Mangrum, Jadeka A, PMHNP   21 mg at 11/09/23 0841   nicotine polacrilex (NICORETTE) gum 4 mg  4 mg Oral PRN Myriam Forehand, NP   4 mg at 11/09/23 1248   topiramate (TOPAMAX) tablet 100 mg  100 mg Oral BID Myriam Forehand, NP   100 mg at 11/09/23 0841   traZODone (DESYREL) tablet 100 mg  100 mg Oral QHS PRN Myriam Forehand, NP   100 mg at 11/08/23 2105    Lab Results: No results found for this or any previous visit  (from the past 48 hour(s)).  Blood Alcohol level:  Lab Results  Component Value Date   [facility] <10 11/05/2023   ETH <5 05/11/2017    Metabolic Disorder Labs: Lab Results  Component Value Date   HGBA1C 5.5 11/05/2023   MPG 111.15 11/05/2023  MPG 108 06/02/2017   No results found for: "PROLACTIN" Lab Results  Component Value Date   CHOL 199 11/05/2023   TRIG 80 11/05/2023   HDL 57 11/05/2023   CHOLHDL 3.5 11/05/2023   VLDL 16 11/05/2023   LDLCALC 126 (H) 11/05/2023   LDLCALC 99 06/02/2017    Physical Findings: AIMS:  , ,  ,  ,    CIWA:    COWS:     Musculoskeletal: Strength & Muscle Tone: within normal limits Gait & Station: normal Patient leans: N/A  Psychiatric Specialty Exam:  Presentation  General Appearance:  Appropriate for Environment; Fairly Groomed  Eye Contact: Good  Speech: Clear and Coherent; Normal Rate  Speech Volume: Increased  Handedness: Ambidextrous   Mood and Affect  Mood: Anxious; Angry; Irritable  Affect: Congruent   Thought Process  Thought Processes: Coherent  Descriptions of Associations:Intact  Orientation:Full (Time, Place and Person) (and situation)  Thought Content:WDL  History of Schizophrenia/Schizoaffective disorder:No data recorded Duration of Psychotic Symptoms:No data recorded Hallucinations:Hallucinations: Command Description of Command Hallucinations: "the voices in my head are making me crazy" Description of Auditory Hallucinations: " they are telling me to go off"  Ideas of Reference:None  Suicidal Thoughts:Suicidal Thoughts: No SI Active Intent and/or Plan: -- (denies) SI Passive Intent and/or Plan: -- (denies)  Homicidal Thoughts:Homicidal Thoughts: No   Sensorium  Memory: Immediate Good; Remote Good  Judgment: Fair  Insight: Fair   Art therapist  Concentration: Good  Attention Span: Good  Recall: Good  Fund of Knowledge: Good  Language: Good   Psychomotor  Activity  Psychomotor Activity: Psychomotor Activity: Normal   Assets  Assets: Communication Skills; Financial Resources/Insurance; Housing; Talents/Skills   Sleep  Sleep: Sleep: Good Number of Hours of Sleep: 7    Physical Exam: Physical Exam ROS Blood pressure 117/84, pulse 88, temperature 98 F (36.7 C), resp. rate 17, height 5\' 5"  (1.651 m), weight 118.4 kg, SpO2 100%. Body mass index is 43.43 kg/m.   Treatment Plan Summary: Daily contact with patient to assess and evaluate symptoms and progress in treatment and Medication management Divalproex ER (Depakote ER) 500 mg oral at bedtime Mood stabilization and management of bipolar disorder Topiramate (Topamax) 100 mg oral BID Adjunctive therapy for mood stabilization and possibly for reducing cravings or supporting mental clarity in bipolar disorder or substance use disorders. Administered Ativan 2 mg by mouth during the episode to manage symptoms of panic Reinforce the importance of support from his sponsor and therapeutic interventions, such as Cognitive Behavioral Therapy (CBT), to address triggers and manage anxiety. Myriam Forehand, NP 11/09/2023, 1:11 PM

## 2023-11-09 NOTE — Group Note (Signed)
[facility] LCSW Group Therapy Note   Group Date: 11/09/2023 Start Time: 1300 End Time: 1400   Type of Therapy/Topic:  Group Therapy:  Emotion Regulation  Participation Level:  Did Not Attend   Mood:  Description of Group:    The purpose of this group is to assist patients in learning to regulate negative emotions and experience positive emotions. Patients will be guided to discuss ways in which they have been vulnerable to their negative emotions. These vulnerabilities will be juxtaposed with experiences of positive emotions or situations, and patients challenged to use positive emotions to combat negative ones. Special emphasis will be placed on coping with negative emotions in conflict situations, and patients will process healthy conflict resolution skills.  Therapeutic Goals: Patient will identify two positive emotions or experiences to reflect on in order to balance out negative emotions:  Patient will label two or more emotions that they find the most difficult to experience:  Patient will be able to demonstrate positive conflict resolution skills through discussion or role plays:   Summary of Patient Progress: Patient did not attend group.   Therapeutic Modalities:   Cognitive Behavioral Therapy Feelings Identification Dialectical Behavioral Therapy   Lowry Ram, LCSW

## 2023-11-09 NOTE — Group Note (Signed)
Date:  11/09/2023 Time:  5:28 PM  Group Topic/Focus:  Activity Group:  The focus of the group is to promote activity for the patients and encourage them to go outside in the courtyard and get some fresh air and some exercise.    Participation Level:  Did Not Attend   Samantha Hunter 11/09/2023, 5:28 PM

## 2023-11-09 NOTE — Progress Notes (Signed)
   11/09/23 1041  Psych Admission Type (Psych Patients Only)  Admission Status Involuntary  Psychosocial Assessment  Patient Complaints Anxiety;Irritability  Eye Contact Fair  Facial Expression Anxious;Worried  Affect Anxious;Irritable  Surveyor, minerals Activity Slow  Appearance/Hygiene Unremarkable  Behavior Characteristics Cooperative;Anxious;Irritable  Mood Anxious  Thought Process  Coherency WDL  Content WDL  Delusions None reported or observed  Perception WDL  Hallucination None reported or observed  Judgment WDL  Confusion None  Danger to Self  Current suicidal ideation? Denies  Self-Injurious Behavior No self-injurious ideation or behavior indicators observed or expressed   Agreement Not to Harm Self Yes  Description of Agreement verbal  Danger to Others  Danger to Others None reported or observed   Multiple demands for nicotine gum with nicotine patch on as ordered. Multiple anxiety mood with medication for relief. Pacing hallways.

## 2023-11-10 DIAGNOSIS — F319 Bipolar disorder, unspecified: Secondary | ICD-10-CM | POA: Diagnosis not present

## 2023-11-10 MED ORDER — OLANZAPINE 5 MG PO TABS
7.5000 mg | ORAL_TABLET | Freq: Every day | ORAL | Status: DC
  Administered 2023-11-10 – 2023-11-11 (×2): 7.5 mg via ORAL
  Filled 2023-11-10 (×2): qty 2

## 2023-11-10 MED ORDER — OLANZAPINE 5 MG PO TBDP
5.0000 mg | ORAL_TABLET | Freq: Once | ORAL | Status: AC
  Administered 2023-11-10: 5 mg via ORAL
  Filled 2023-11-10: qty 1

## 2023-11-10 MED ORDER — DIVALPROEX SODIUM ER 500 MG PO TB24
500.0000 mg | ORAL_TABLET | Freq: Two times a day (BID) | ORAL | Status: DC
  Administered 2023-11-10 – 2023-11-12 (×4): 500 mg via ORAL
  Filled 2023-11-10 (×6): qty 1

## 2023-11-10 NOTE — Group Note (Signed)
Date:  11/10/2023 Time:  4:16 AM  Group Topic/Focus:  Wrap-Up Group:   The focus of this group is to help patients review their daily goal of treatment and discuss progress on daily workbooks.    Participation Level:  Minimal  Participation Quality:  Appropriate and Attentive  Affect:  Appropriate  Cognitive:  Alert and Appropriate  Insight: Appropriate  Engagement in Group:  Engaged and Limited  Modes of Intervention:  Discussion  Additional Comments:     Maglione,Kinsleigh Ludolph E 11/10/2023, 4:16 AM

## 2023-11-10 NOTE — Plan of Care (Signed)
  Problem: Education: Goal: Emotional status will improve Outcome: Progressing   Problem: Activity: Goal: Interest or engagement in activities will improve Outcome: Progressing   

## 2023-11-10 NOTE — Group Note (Signed)
Date:  11/10/2023 Time:  11:13 AM  Group Topic/Focus:  Goals Group:   The focus of this group is to help patients establish daily goals to achieve during treatment and discuss how the patient can incorporate goal setting into their daily lives to aide in recovery.    Participation Level:  Active  Participation Quality:  Appropriate  Affect:  Appropriate  Cognitive:  Appropriate  Insight: Limited  Engagement in Group:  Engaged  Modes of Intervention:  Discussion  Additional Comments:    Zakiya Sporrer 11/10/2023, 11:13 AM

## 2023-11-10 NOTE — Group Note (Signed)
Date:  11/10/2023 Time:  5:20 PM  Group Topic/Focus:  Rediscovering Joy:   The focus of this group is to explore various ways to relieve stress in a positive manner.    Participation Level:  Active  Participation Quality:  Appropriate  Affect:  Appropriate  Cognitive:  Appropriate  Insight: Appropriate  Engagement in Group:  Engaged  Modes of Intervention:  Socialization  Additional Comments:    Wilford Corner 11/10/2023, 5:20 PM

## 2023-11-10 NOTE — Progress Notes (Signed)
[facility] MD Progress Note  11/10/2023 2:27 PM Samantha Hunter  MRN:  [id number] Subjective:  22 year old Hispanic transgender female, reports experiencing suicidal thoughts and feeling very anxious, stating, "Is there something you can give me for my nerves besides benzos?" The patient denies hallucinations or delusions but appears visibly distressed.The patient is in acute emotional distress, reporting suicidal thoughts but remains cooperative with treatment. Immediate pharmacologic intervention was provided to manage agitation and suicidal ideation. Principal Problem: Bipolar 1 disorder (HCC) Diagnosis: Principal Problem:   Bipolar 1 disorder (HCC) Active Problems:   Elevated BP without diagnosis of hypertension   Suicidal ideation  Total Time spent with patient: 1 hour  Past Psychiatric History: Anxiety Gender Dysphoria Polysubstance abuse   Past Medical History:  Past Medical History:  Diagnosis Date   Allergy    Anemia    Anxiety    Asthma    Cannabis use disorder, mild, abuse 03/16/2017   Depression    Gender dysphoria 05/12/2017   GERD (gastroesophageal reflux disease)    Non-suicidal self harm 03/16/2017   OCD (obsessive compulsive disorder) 03/16/2017   Suicidal ideation 03/16/2017    Past Surgical History:  Procedure Laterality Date   wisdom tooth removal Bilateral    Family History:  Family History  Problem Relation Age of Onset   Mental illness Mother    Alcohol abuse Mother    Drug abuse Mother    Depression Mother    Hyperlipidemia Mother    Mental illness Father    Alcohol abuse Father    Drug abuse Father    Depression Father    Cancer Father        skin   Heart disease Father    Hyperlipidemia Father    Stroke Father    Mental illness Brother    Anxiety disorder Brother    Depression Brother    Anxiety disorder Sister    Depression Sister    Mental illness Sister    Alcohol abuse Maternal Uncle    Drug abuse Maternal Uncle    Alcohol abuse Maternal  Grandfather    Mental illness Brother    Mental illness Sister    Family Psychiatric  History: see above Social History:  Social History   Substance and Sexual Activity  Alcohol Use Not Currently     Social History   Substance and Sexual Activity  Drug Use Not Currently   Frequency: 14.0 times per week   Types: Marijuana, Cocaine   Comment: "Coke, weed, and xanax"    Social History   Socioeconomic History   Marital status: Single    Spouse name: Not on file   Number of children: Not on file   Years of education: Not on file   Highest education level: Not on file  Occupational History   Not on file  Tobacco Use   Smoking status: Every Day    Current packs/day: 0.25    Average packs/day: 0.3 packs/day for 5.9 years (1.5 ttl pk-yrs)    Types: Cigarettes    Start date: 02/08/2017    Last attempt to quit: 02/08/2018   Smokeless tobacco: Former    Types: Chew   Tobacco comments:    Declined cessation  Vaping Use   Vaping status: Every Day  Substance and Sexual Activity   Alcohol use: Not Currently   Drug use: Not Currently    Frequency: 14.0 times per week    Types: Marijuana, Cocaine    Comment: "Coke, weed, and xanax"   Sexual  activity: Yes    Birth control/protection: Surgical, Other-see comments    Comment: spouse has had vasectomy, pt amenorrheic on testosterone  Other Topics Concern   Not on file  Social History Narrative   Not on file   Social Determinants of Health   Financial Resource Strain: Not on file  Food Insecurity: No Food Insecurity (11/06/2023)   Hunger Vital Sign    Worried About Running Out of Food in the Last Year: Never true    Ran Out of Food in the Last Year: Never true  Transportation Needs: No Transportation Needs (11/06/2023)   PRAPARE - Administrator, Civil Service (Medical): No    Lack of Transportation (Non-Medical): No  Physical Activity: Not on file  Stress: Not on file  Social Connections: Not on file    Additional Social History:                         Sleep: Fair  Appetite:  Good  Current Medications: Current Facility-Administered Medications  Medication Dose Route Frequency Provider Last Rate Last Admin   albuterol (VENTOLIN HFA) 108 (90 Base) MCG/ACT inhaler 2 puff  2 puff Inhalation Q4H PRN Motley-Mangrum, Jadeka A, PMHNP       alum & mag hydroxide-simeth (MAALOX/MYLANTA) 200-200-20 MG/5ML suspension 30 mL  30 mL Oral Q4H PRN Motley-Mangrum, Jadeka A, PMHNP       buPROPion (WELLBUTRIN) tablet 75 mg  75 mg Oral Daily Myriam Forehand, NP   75 mg at 11/10/23 0857   divalproex (DEPAKOTE ER) 24 hr tablet 500 mg  500 mg Oral QHS Motley-Mangrum, Jadeka A, PMHNP   500 mg at 11/09/23 2237   hydrOXYzine (ATARAX) tablet 50 mg  50 mg Oral Q6H PRN Myriam Forehand, NP   50 mg at 11/10/23 0805   ibuprofen (ADVIL) tablet 800 mg  800 mg Oral Q8H PRN Myriam Forehand, NP   800 mg at 11/09/23 1251   magnesium hydroxide (MILK OF MAGNESIA) suspension 30 mL  30 mL Oral Daily PRN Motley-Mangrum, Jadeka A, PMHNP       mometasone-formoterol (DULERA) 200-5 MCG/ACT inhaler 2 puff  2 puff Inhalation BID Motley-Mangrum, Jadeka A, PMHNP       nicotine (NICODERM CQ - dosed in mg/24 hours) patch 21 mg  21 mg Transdermal Daily Motley-Mangrum, Jadeka A, PMHNP   21 mg at 11/10/23 0936   nicotine polacrilex (NICORETTE) gum 4 mg  4 mg Oral PRN Myriam Forehand, NP   4 mg at 11/10/23 1135   OLANZapine (ZYPREXA) tablet 7.5 mg  7.5 mg Oral QHS Myriam Forehand, NP       OLANZapine zydis (ZYPREXA) disintegrating tablet 5 mg  5 mg Oral Once Myriam Forehand, NP       topiramate (TOPAMAX) tablet 100 mg  100 mg Oral BID Myriam Forehand, NP   100 mg at 11/10/23 0857   traZODone (DESYREL) tablet 100 mg  100 mg Oral QHS PRN Myriam Forehand, NP   100 mg at 11/09/23 2237    Lab Results: No results found for this or any previous visit (from the past 48 hour(s)).  Blood Alcohol level:  Lab Results  Component Value Date   [facility] <10  11/05/2023   ETH <5 05/11/2017    Metabolic Disorder Labs: Lab Results  Component Value Date   HGBA1C 5.5 11/05/2023   MPG 111.15 11/05/2023   MPG 108 06/02/2017   No results  found for: "PROLACTIN" Lab Results  Component Value Date   CHOL 199 11/05/2023   TRIG 80 11/05/2023   HDL 57 11/05/2023   CHOLHDL 3.5 11/05/2023   VLDL 16 11/05/2023   LDLCALC 126 (H) 11/05/2023   LDLCALC 99 06/02/2017    Physical Findings: AIMS:  , ,  ,  ,    CIWA:    COWS:     Musculoskeletal: Strength & Muscle Tone: within normal limits Gait & Station: normal Patient leans: N/A  Psychiatric Specialty Exam:  Presentation  General Appearance:  Fairly Groomed  Eye Contact: Good  Speech: Clear and Coherent; Normal Rate  Speech Volume: Decreased  Handedness: Right   Mood and Affect  Mood: Anxious; Irritable  Affect: Flat   Thought Process  Thought Processes: Coherent  Descriptions of Associations:Intact  Orientation:Full (Time, Place and Person)  Thought Content:WDL  History of Schizophrenia/Schizoaffective disorder:No data recorded Duration of Psychotic Symptoms:No data recorded Hallucinations:Hallucinations: None Description of Command Hallucinations: denies Description of Auditory Hallucinations: denies  Ideas of Reference:None  Suicidal Thoughts:Suicidal Thoughts: Yes, Passive SI Active Intent and/or Plan: Without Intent; Without Plan; Without Access to Means; Without Means to Carry Out SI Passive Intent and/or Plan: Without Intent; Without Plan; Without Access to Means; Without Means to Carry Out  Homicidal Thoughts:Homicidal Thoughts: No   Sensorium  Memory: Immediate Good; Immediate Fair  Judgment: Poor  Insight: Poor   Executive Functions  Concentration: Fair  Attention Span: Fair  Recall: Good  Fund of Knowledge: Good  Language: Good   Psychomotor Activity  Psychomotor Activity: Psychomotor Activity: Normal   Assets   Assets: Communication Skills; Financial Resources/Insurance; Housing; Social Support   Sleep  Sleep: Sleep: Fair Number of Hours of Sleep: 4    Physical Exam: Physical Exam Vitals and nursing note reviewed.  Constitutional:      Appearance: Normal appearance.  HENT:     Head: Normocephalic and atraumatic.     Nose: Nose normal.  Pulmonary:     Effort: Pulmonary effort is normal.  Musculoskeletal:        General: Normal range of motion.     Cervical back: Normal range of motion.  Neurological:     General: No focal deficit present.     Mental Status: He is alert and oriented to person, place, and time. Mental status is at baseline.  Psychiatric:        Attention and Perception: Attention and perception normal.        Mood and Affect: Mood is anxious. Affect is flat.        Speech: Speech normal.        Behavior: Behavior normal. Behavior is cooperative.        Thought Content: Thought content includes suicidal ideation.        Cognition and Memory: Cognition and memory normal.        Judgment: Judgment is impulsive.    Review of Systems  Neurological:  Positive for tremors.  Psychiatric/Behavioral:  Positive for suicidal ideas. The patient is nervous/anxious.   All other systems reviewed and are negative.  Blood pressure 107/63, pulse 99, temperature 98.5 F (36.9 C), resp. rate (!) 21, height 5\' 5"  (1.651 m), weight 118.4 kg, SpO2 98%. Body mass index is 43.43 kg/m.   Treatment Plan Summary: Daily contact with patient to assess and evaluate symptoms and progress in treatment and Medication management Zydis (Olanzapine) 5 mg SL, one-time dose: Administered for acute distress and agitation. Olanzapine 7.5 mg, nightly for mood  stabilization and management of suicidal ideation Divalproex ER (Depakote ER) 500 mg oral BID Wellbutrin 75ng daily Mood stabilization and management of bipolar disorder Topiramate (Topamax) 100 mg oral BID Adjunctive therapy for mood  stabilization and possibly for reducing cravings or supporting mental clarity in bipolar disorder or substance use disorders. Myriam Forehand, NP 11/10/2023, 2:27 PM

## 2023-11-10 NOTE — Group Note (Signed)
Date:  11/10/2023 Time:  8:19 PM  Group Topic/Focus:  Wrap-Up Group:   The focus of this group is to help patients review their daily goal of treatment and discuss progress on daily workbooks.    Participation Level:  Active  Participation Quality:  Appropriate and Attentive  Affect:  Appropriate  Cognitive:  Alert and Appropriate  Insight: Appropriate  Engagement in Group:  Engaged  Modes of Intervention:  Discussion  Additional Comments:     Maglione,Aisea Bouldin E 11/10/2023, 8:19 PM

## 2023-11-10 NOTE — Progress Notes (Signed)
Pt continues to request nicotine repacement regularly.  Pt endorses passive SI at times.  Pt denies HI AVH.  Pt rates depression 6, hopelessness 5 and anxiety 9.  Continued monitoring for safety.    11/10/23 1700  Psych Admission Type (Psych Patients Only)  Admission Status Involuntary  Psychosocial Assessment  Patient Complaints Anxiety  Eye Contact Fair  Facial Expression Anxious  Affect Anxious  Speech Logical/coherent  Interaction Assertive  Motor Activity Slow  Appearance/Hygiene Unremarkable  Behavior Characteristics Anxious;Irritable  Mood Anxious  Thought Process  Coherency WDL  Content WDL  Delusions None reported or observed  Perception WDL  Hallucination None reported or observed  Judgment WDL  Confusion None  Danger to Self  Current suicidal ideation? Passive  Danger to Others  Danger to Others None reported or observed

## 2023-11-11 NOTE — Progress Notes (Signed)
Patient requested to have 1700 dose of Depakote moved to bedtime. This Clinical research associate moved dose to 2000 and NP is aware.

## 2023-11-11 NOTE — Group Note (Deleted)
Date:  11/11/2023 Time:  3:00 PM  Group Topic/Focus:  Healthy Communication:   The focus of this group is to discuss communication, barriers to communication, as well as healthy ways to communicate with others. MoviesTherapy encourages emotional release Even those who often have trouble expressing their emotions might find themselves laughing or crying during a film. This release of emotions can have a cathartic effect and also make it easier for a person to become more comfortable in expressing their emotions.     Participation Level:  {BHH PARTICIPATION ZHYQM:57846}  Participation Quality:  {BHH PARTICIPATION QUALITY:22265}  Affect:  {BHH AFFECT:22266}  Cognitive:  {BHH COGNITIVE:22267}  Insight: {BHH Insight2:20797}  Engagement in Group:  {BHH ENGAGEMENT IN GROUP:22268}  Modes of Intervention:  {BHH MODES OF INTERVENTION:22269}  Additional Comments:  ***  Samantha Hunter 11/11/2023, 3:00 PM

## 2023-11-11 NOTE — Progress Notes (Signed)
Patient presents with flat, sad affect. Agitated due to pharmacy out of Nicotine gum. Noted in dayroom watching tv with peers. No other complaints or concerns voiced other than the nicotine gum. Denies SI, Hi, AVH. Patient remains safe on unit with q 15 min checks.

## 2023-11-11 NOTE — Progress Notes (Signed)
D- Patient alert and oriented. Patient presented in an anxious, preoccupied mood reproting that he slept good last night and had no complaints to voice to this Clinical research associate. Patient endorsed passive SI, but does contract for safety. Patient endorsed hopelessness, depression, and anxiety, rating them a "7/10", and "8/10", stating that "I never know when I'm gonna go home". Patient denied HI/AVH and pain at this time. Patient's goal for today is to "take new moods", in which he will "talk to people", in order to achieve his goal.  A- Scheduled medications administered to patient, per MD orders. Support and encouragement provided. Routine safety checks conducted every 15 minutes. Patient informed to notify staff with problems or concerns.  R- No adverse drug reactions noted. Patient contracts for safety at this time. Patient compliant with medications and treatment plan. Patient receptive, calm, and cooperative. Patient interacts well with others on the unit. Patient remains safe at this time.   11/11/23 0810  Psych Admission Type (Psych Patients Only)  Admission Status Involuntary  Psychosocial Assessment  Patient Complaints Anxiety;Depression;Hopelessness  Eye Contact Fair  Facial Expression Anxious  Affect Preoccupied;Anxious;Irritable;Blunted (fixated on [place])  Speech Logical/coherent  Interaction Demanding;Needy  Motor Activity Slow  Appearance/Hygiene Layered clothes;Unremarkable  Behavior Characteristics Anxious  Mood Anxious  Aggressive Behavior  Effect No apparent injury  Thought Process  Coherency Circumstantial  Content Preoccupation  Delusions None reported or observed  Perception WDL  Hallucination None reported or observed  Judgment WDL  Confusion None  Danger to Self  Current suicidal ideation? Passive  Self-Injurious Behavior Some self-injurious ideation observed or expressed.  No lethal plan expressed   Agreement Not to Harm Self Yes  Description of Agreement Verbal   Danger to Others  Danger to Others None reported or observed

## 2023-11-11 NOTE — Plan of Care (Signed)

## 2023-11-11 NOTE — Plan of Care (Signed)
  Problem: Education: Goal: Emotional status will improve Outcome: Progressing Goal: Mental status will improve Outcome: Progressing   Problem: Activity: Goal: Interest or engagement in activities will improve Outcome: Not Progressing   Problem: Coping: Goal: Ability to verbalize frustrations and anger appropriately will improve Outcome: Progressing   Problem: Safety: Goal: Periods of time without injury will increase Outcome: Progressing

## 2023-11-11 NOTE — Group Note (Signed)
Date:  11/11/2023 Time:  10:26 AM  Group Topic/Focus:  Spirituality:   The focus of this group is to discuss how one's spirituality can aide in recovery.    Participation Level:  Did Not Attend   Samantha Hunter 11/11/2023, 10:26 AM

## 2023-11-11 NOTE — Group Note (Signed)
Date:  11/11/2023 Time:  3:18 PM  Group Topic/Focus:  Activity Group:  The focus of the group is to promote activity for the patients and encourage them to come outside in the courtyard and get some fresh air and some exercise.    Participation Level:  Did Not Attend   Samantha Hunter 11/11/2023, 3:18 PM

## 2023-11-11 NOTE — Group Note (Signed)
Date:  11/11/2023 Time:  9:39 PM  Group Topic/Focus:  Wrap-Up Group:   The focus of this group is to help patients review their daily goal of treatment and discuss progress on daily workbooks.    Participation Level:  Active  Participation Quality:  Appropriate, Sharing, and Supportive  Affect:  Appropriate  Cognitive:  Appropriate  Insight: Appropriate and Good  Engagement in Group:  Supportive  Modes of Intervention:  Support  Additional Comments:     Belva Crome 11/11/2023, 9:39 PM

## 2023-11-11 NOTE — Plan of Care (Signed)

## 2023-11-12 ENCOUNTER — Other Ambulatory Visit: Payer: Self-pay

## 2023-11-12 MED ORDER — DIVALPROEX SODIUM ER 500 MG PO TB24
500.0000 mg | ORAL_TABLET | Freq: Two times a day (BID) | ORAL | 0 refills | Status: DC
  Filled 2023-11-12: qty 20, 10d supply, fill #0

## 2023-11-12 MED ORDER — OLANZAPINE 7.5 MG PO TABS
7.5000 mg | ORAL_TABLET | Freq: Every day | ORAL | 0 refills | Status: DC
  Filled 2023-11-12: qty 10, 10d supply, fill #0

## 2023-11-12 NOTE — Group Note (Signed)
Date:  11/12/2023 Time:  10:26 AM  Group Topic/Focus:  Dimensions of Wellness:   The focus of this group is to introduce the topic of wellness and discuss the role each dimension of wellness plays in total health. Goals Group:   The focus of this group is to help patients establish daily goals to achieve during treatment and discuss how the patient can incorporate goal setting into their daily lives to aide in recovery. Self Care:   The focus of this group is to help patients understand the importance of self-care in order to improve or restore emotional, physical, spiritual, interpersonal, and financial health.    Participation Level:  Active  Participation Quality:  Appropriate  Affect:  Appropriate  Cognitive:  Alert and Appropriate  Insight: Appropriate  Engagement in Group:  Developing/Improving and Engaged  Modes of Intervention:  Activity, Discussion, Education, and Support  Additional Comments:    Rosaura Carpenter 11/12/2023, 10:26 AM

## 2023-11-12 NOTE — Group Note (Signed)
Recreation Therapy Group Note   Group Topic:Coping Skills  Group Date: 11/12/2023 Start Time: 1010 End Time: 1105 Facilitators: Rosina Lowenstein, LRT, CTRS Location:  Craft Room  Group Description: Mind Map.  Patient was provided a blank template of a diagram with 32 blank boxes in a tiered system, branching from the center (similar to a bubble chart). LRT directed patients to label the middle of the diagram "Coping Skills". LRT and patients then came up with 8 different coping skills as examples. Pt were directed to record their coping skills in the 2nd tier boxes closest to the center.  Patients would then share their coping skills with the group as LRT wrote them out. LRT gave a handout of 99 different coping skills at the end of group.   Goal Area(s) Addressed: Patients will be able to define "coping skills". Patient will identify new coping skills.  Patient will increase communication.   Affect/Mood: Appropriate and Blunted   Participation Level: Active and Engaged   Participation Quality: Independent   Behavior: Alert and Cooperative   Speech/Thought Process: Coherent   Insight: Good   Judgement: Good   Modes of Intervention: Education, Group work, Guided Discussion, and Worksheet   Patient Response to Interventions:  Attentive, Engaged, and Receptive   Education Outcome:  Acknowledges education   Clinical Observations/Individualized Feedback: [name] was active in their participation of session activities and group discussion. Pt identified "NA meetings, tattoos, and bouncing a ball" as coping skills. Pt shared that the only thing that got them through recovery was a "moon ball". Pt also shared that tattoos are a healthy coping skill to replace self harm. Pt interacted well with LRT and peers duration of session.    Plan: Continue to engage patient in RT group sessions 2-3x/week.   Rosina Lowenstein, LRT, CTRS 11/12/2023 12:14 PM

## 2023-11-12 NOTE — Discharge Summary (Addendum)
Physician Discharge Summary Note  Patient:  Samantha Hunter is an 22 y.o., adult MRN:  [id number] DOB:  2001-05-01 Patient phone:  [phone] (home)  Patient address:   [address] [place] [address],  Total Time spent with patient: 2 hours  Date of Admission:  11/06/2023 Date of Discharge: 11/12/2023  Reason for Admission:  22 year old transgender Hispanic female with a history of bipolar disorder, depression, anxiety, and substance use disorder, was admitted for stabilization after presenting with suicidal ideation and a plan to shoot himself. The patient reported feelings of guilt and remorse following a relapse on cocaine and THC on October 31, but stated that he had not used substances in two weeks prior to admission.the patient expressed hopelessness, difficulty sleeping (2-3 hours per night), poor appetite, and feelings of being a failure. He reported prior suicidal ideation, with a history of a suicide attempt in 2018 via medication overdose, and difficulty adjusting to recent changes in medications prescribed by his outpatient psychiatrist.The patient's admission was prompted by a recommendation from his therapist, who resumed weekly sessions with the patient after a six-week gap. Concerns included the patient's inability to verbally contract for safety, his access to firearms at his father's home, and a worsening of depressive symptoms. Immediate inpatient intervention was deemed necessary to address suicidal ideation, stabilize mood, and ensure safety.  Principal Problem: Bipolar 1 disorder (HCC) Discharge Diagnoses: Principal Problem:   Bipolar 1 disorder (HCC) Active Problems:   Elevated BP without diagnosis of hypertension   Cannabis use disorder, mild, abuse   Suicidal ideation   Cocaine abuse (HCC)   Past Psychiatric History: Gender Dysphoria Anxiety Polysubstance Abuse   Past Medical History:  Past Medical History:  Diagnosis Date   Allergy    Anemia     Anxiety    Asthma    Cannabis use disorder, mild, abuse 03/16/2017   Depression    Gender dysphoria 05/12/2017   GERD (gastroesophageal reflux disease)    Non-suicidal self harm 03/16/2017   OCD (obsessive compulsive disorder) 03/16/2017   Suicidal ideation 03/16/2017    Past Surgical History:  Procedure Laterality Date   wisdom tooth removal Bilateral    Family History:  Family History  Problem Relation Age of Onset   Mental illness Mother    Alcohol abuse Mother    Drug abuse Mother    Depression Mother    Hyperlipidemia Mother    Mental illness Father    Alcohol abuse Father    Drug abuse Father    Depression Father    Cancer Father        skin   Heart disease Father    Hyperlipidemia Father    Stroke Father    Mental illness Brother    Anxiety disorder Brother    Depression Brother    Anxiety disorder Sister    Depression Sister    Mental illness Sister    Alcohol abuse Maternal Uncle    Drug abuse Maternal Uncle    Alcohol abuse Maternal Grandfather    Mental illness Brother    Mental illness Sister    Family Psychiatric  History: see above Social History:  Social History   Substance and Sexual Activity  Alcohol Use Not Currently     Social History   Substance and Sexual Activity  Drug Use Not Currently   Frequency: 14.0 times per week   Types: Marijuana, Cocaine   Comment: "Coke, weed, and xanax"    Social History   Socioeconomic History  Marital status: Single    Spouse name: Not on file   Number of children: Not on file   Years of education: Not on file   Highest education level: Not on file  Occupational History   Not on file  Tobacco Use   Smoking status: Every Day    Current packs/day: 0.25    Average packs/day: 0.3 packs/day for 5.9 years (1.5 ttl pk-yrs)    Types: Cigarettes    Start date: 02/08/2017    Last attempt to quit: 02/08/2018   Smokeless tobacco: Former    Types: Chew   Tobacco comments:    Declined cessation  Vaping Use    Vaping status: Every Day  Substance and Sexual Activity   Alcohol use: Not Currently   Drug use: Not Currently    Frequency: 14.0 times per week    Types: Marijuana, Cocaine    Comment: "Coke, weed, and xanax"   Sexual activity: Yes    Birth control/protection: Surgical, Other-see comments    Comment: spouse has had vasectomy, pt amenorrheic on testosterone  Other Topics Concern   Not on file  Social History Narrative   Not on file   Social Determinants of Health   Financial Resource Strain: Not on file  Food Insecurity: No Food Insecurity (11/06/2023)   Hunger Vital Sign    Worried About Running Out of Food in the Last Year: Never true    Ran Out of Food in the Last Year: Never true  Transportation Needs: No Transportation Needs (11/06/2023)   PRAPARE - Administrator, Civil Service (Medical): No    Lack of Transportation (Non-Medical): No  Physical Activity: Not on file  Stress: Not on file  Social Connections: Not on file    Hospital Course:   22 year old transgender Hispanic female, was admitted on 11/05/2023 for stabilization following suicidal ideation with a plan to shoot himself. The patient presented with a history of bipolar disorder, depression, anxiety, substance use disorder, and previous suicidal attempts. Upon admission, the patient reported feelings of guilt and hopelessness after a relapse on cocaine and THC two weeks prior, poor sleep (2-3 hours nightly), and a diminished appetite.The patient was evaluated daily for suicidal ideation, mood stabilization, and psychotic symptoms. He initially endorsed suicidal ideation but denied command auditory hallucinations or delusional thinking. Throughout the hospitalization, the patient displayed goal-directed thought processes and denied suicidal or homicidal ideation as symptoms improved.The patient was educated on medication adherence and potential side effects.The patient engaged in individual and group therapy  sessions to address underlying guilt, hopelessness, and coping strategies. Psychoeducation on managing bipolar disorder and substance use was provided, including discussions on relapse prevention and harm reduction.Sleep improved slightly with the addition of Olanzapine and structured sleep hygiene practices.The patient's appetite remained poor, but dietary counseling was provided.A comprehensive safety plan was developed, focusing on avoiding access to firearms, engaging in outpatient therapy, and utilizing crisis resources. Family involvement was encouraged, with emphasis on securing firearms in the home and supporting the patient in maintaining stability.The patient received education on maintaining sobriety and relapse prevention strategies.He expressed motivation to remain abstinent and return to weekly therapy sessions at the [facility].Improved mood with reduced anxiety and no current suicidal or homicidal ideation. Commitment to medication adherence and follow-up care. Awareness of crisis resources and understanding of the importance of continued outpatient therapy and psychiatry.The patient was discharged in stable condition with a scheduled follow-up at the [facility] on 11/13/2023. Crisis resources and safety measures were  reinforced to ensure ongoing support.    Musculoskeletal: Strength & Muscle Tone: within normal limits Gait & Station: normal Patient leans: N/A   Psychiatric Specialty Exam:  Presentation  General Appearance:  Appropriate for Environment  Eye Contact: Minimal  Speech: Clear and Coherent; Normal Rate  Speech Volume: Normal  Handedness: Right   Mood and Affect  Mood: Anxious  Affect: Appropriate   Thought Process  Thought Processes: Coherent  Descriptions of Associations:Circumstantial  Orientation:Full (Time, Place and Person) (and situation)  Thought Content:WDL  History of Schizophrenia/Schizoaffective disorder:none  reported Duration of Psychotic Symptoms:none reported Hallucinations:Hallucinations: None Description of Command Hallucinations: denies Description of Auditory Hallucinations: denies  Ideas of Reference:None  Suicidal Thoughts:Suicidal Thoughts: No SI Active Intent and/or Plan: -- (denies) SI Passive Intent and/or Plan: -- (denies)  Homicidal Thoughts:Homicidal Thoughts: No   Sensorium  Memory: Immediate Good; Remote Good  Judgment: Fair  Insight: Fair   Art therapist  Concentration: Fair  Attention Span: Fair  Recall: Good  Fund of Knowledge: Good  Language: Good   Psychomotor Activity  Psychomotor Activity: Psychomotor Activity: Normal   Assets  Assets: Communication Skills; Financial Resources/Insurance; Social Support; Housing   Sleep  Sleep: Sleep: Good Number of Hours of Sleep: 8    Physical Exam: Physical Exam Vitals and nursing note reviewed.  Constitutional:      Appearance: Normal appearance.  HENT:     Head: Normocephalic and atraumatic.     Nose: Nose normal.  Pulmonary:     Effort: Pulmonary effort is normal.  Musculoskeletal:        General: Normal range of motion.     Cervical back: Normal range of motion.  Neurological:     General: No focal deficit present.     Mental Status: He is alert and oriented to person, place, and time. Mental status is at baseline.  Psychiatric:        Attention and Perception: Attention and perception normal.        Mood and Affect: Mood and affect normal.        Speech: Speech normal.        Behavior: Behavior normal. Behavior is cooperative.        Thought Content: Thought content normal.        Cognition and Memory: Cognition and memory normal.        Judgment: Judgment normal.    ROS Blood pressure 102/60, pulse 76, temperature 98.6 F (37 C), resp. rate 18, height 5\' 5"  (1.651 m), weight 118.4 kg, SpO2 99%. Body mass index is 43.43 kg/m.   Social History   Tobacco Use   Smoking Status Every Day   Current packs/day: 0.25   Average packs/day: 0.3 packs/day for 5.9 years (1.5 ttl pk-yrs)   Types: Cigarettes   Start date: 02/08/2017   Last attempt to quit: 02/08/2018  Smokeless Tobacco Former   Types: Chew  Tobacco Comments   Declined cessation   Tobacco Cessation:  A prescription for an FDA-approved tobacco cessation medication provided at discharge   Blood Alcohol level:  Lab Results  Component Value Date   [facility] <10 11/05/2023   ETH <5 05/11/2017    Metabolic Disorder Labs:  Lab Results  Component Value Date   HGBA1C 5.5 11/05/2023   MPG 111.15 11/05/2023   MPG 108 06/02/2017   No results found for: "PROLACTIN" Lab Results  Component Value Date   CHOL 199 11/05/2023   TRIG 80 11/05/2023   HDL 57 11/05/2023   CHOLHDL  3.5 11/05/2023   VLDL 16 11/05/2023   LDLCALC 126 (H) 11/05/2023   LDLCALC 99 06/02/2017    See Psychiatric Specialty Exam and Suicide Risk Assessment completed by Attending Physician prior to discharge.  Discharge destination:  Home  Is patient on multiple antipsychotic therapies at discharge:  No   Has Patient had three or more failed trials of antipsychotic monotherapy by history:  No  Recommended Plan for Multiple Antipsychotic Therapies: NA   Allergies as of 11/12/2023       Reactions   Coconut (cocos Nucifera) Itching   Pineapple Hives, Itching        Medication List     STOP taking these medications    gabapentin 300 MG capsule Commonly known as: NEURONTIN   Lybalvi 15-10 MG Tabs Generic drug: OLANZapine-Samidorphan       TAKE these medications      Indication  albuterol 108 (90 Base) MCG/ACT inhaler Commonly known as: VENTOLIN HFA Inhale 2 puffs into the lungs every 4 (four) hours as needed for wheezing or shortness of breath (cough, shortness of breath or wheezing.). What changed: Another medication with the same name was removed. Continue taking this medication, and follow the directions  you see here.  Indication: Asthma   budesonide-formoterol 160-4.5 MCG/ACT inhaler Commonly known as: SYMBICORT Inhale 2 puffs into the lungs 2 (two) times daily.  Indication: Asthma   buPROPion 75 MG tablet Commonly known as: WELLBUTRIN Take 1 tablet (75 mg total) by mouth daily for 10 days.  Indication: Major Depressive Disorder   divalproex 500 MG 24 hr tablet Commonly known as: DEPAKOTE ER Take 500 mg by mouth at bedtime. What changed: Another medication with the same name was added. Make sure you understand how and when to take each.  Indication: MIXED BIPOLAR AFFECTIVE DISORDER   divalproex 500 MG 24 hr tablet Commonly known as: DEPAKOTE ER Take 1 tablet (500 mg total) by mouth 2 (two) times daily for 10 days. What changed: You were already taking a medication with the same name, and this prescription was added. Make sure you understand how and when to take each.  Indication: Depressive Phase of Manic-Depression   fluticasone-salmeterol 500-50 MCG/ACT Aepb Commonly known as: ADVAIR Inhale 1 puff into the lungs in the morning and at bedtime for 10 days.  Indication: Asthma   hydrOXYzine 50 MG tablet Commonly known as: ATARAX Take 1 tablet (50 mg total) by mouth every 8 (eight) hours as needed for up to 10 days for anxiety. What changed:  medication strength how much to take when to take this  Indication: Feeling Anxious, Feeling Tense   ibuprofen 800 MG tablet Commonly known as: ADVIL Take 1 tablet (800 mg total) by mouth every 8 (eight) hours as needed for up to 10 days for moderate pain (pain score 4-6) or headache.  Indication: Headache, Migraine Headache   Nicotine Step 1 21 MG/24HR patch Generic drug: nicotine Place 1 patch (21 mg total) onto the skin daily for 10 days.  Indication: Nicotine Addiction   OLANZapine 7.5 MG tablet Commonly known as: ZYPREXA Take 1 tablet (7.5 mg total) by mouth at bedtime for 10 days.  Indication: Depressive Phase of  Manic-Depression   SM Nicotine Polacrilex 4 MG gum Generic drug: nicotine polacrilex Take 1 each (4 mg total) by mouth as needed for up to 10 days for smoking cessation.  Indication: Nicotine Addiction   topiramate 100 MG tablet Commonly known as: TOPAMAX Take 1 tablet (100 mg total) by  mouth 2 (two) times daily for 10 days.  Indication: Abuse or Misuse of Alcohol   traZODone 100 MG tablet Commonly known as: DESYREL Take 1 tablet (100 mg total) by mouth at bedtime as needed for up to 10 days for sleep.  Indication: Trouble Sleeping, Major Depressive Disorder        Follow-up Information     [facility]. Go to.   Specialty: Behavioral Health Why: Appointment for 11/13/2023 at 10 AM. They will call following discharge to confirm appointment time. Contact information: [address] [place] [address] [phone]                 Follow-up recommendations:  Activity:  as tolerated Diet:  heart healthy  Comments:   Olanzapine (Zyprexa): 7.5 mg nightly for mood stabilization and management of suicidal ideation. Divalproex ER (Depakote ER): 500 mg BID for bipolar disorder. Wellbutrin (Bupropion): 75 mg daily for depression. Topiramate (Topamax): 100 mg BID for adjunctive mood stabilization. [facility]88 Su[facility] & Crisis Lifeline: Available 24/7 for immediate support. Mobile Crisis Unit: [phone] [facility]). [facility]: [address] [place], [place], [place] [address]; Phone: [phone]. [facility]: Ensure continued appointments for outpatient therapy and psychiatry [facility]: Specialty: Behavioral Health Address: [address] [place], [place], [place] [address] Phone: [phone] Appointment: Scheduled for 11/13/2023 at 10 AM. The patient has been instructed to confirm the appointment following discharge.  Signed: Myriam Forehand, NP 11/12/2023, 12:38 PM

## 2023-11-12 NOTE — Progress Notes (Signed)
  [facility] Adult Case Management Discharge Plan :  Will you be returning to the same living situation after discharge:  No. At discharge, do you have transportation home?: Yes,  pt support network to provide transportation.  Do you have the ability to pay for your medications: Yes,  [employer].  Release of information consent forms completed and in the chart;  Patient's signature needed at discharge.  Patient to Follow up at:  Follow-up Information     [facility]. Go to.   Specialty: Behavioral Health Why: Appointment for 11/13/2023 at 10 AM. They will call following discharge to confirm appointment time. Contact information: [address] [place] [address] [phone]                 Next level of care provider has access to [facility] Link:no  Safety Planning and Suicide Prevention discussed: Yes,  SPE completed with pt.      Has patient been referred to the Quitline?: Patient refused referral for treatment  Patient has been referred for addiction treatment: No known substance use disorder.  Glenis Smoker, LCSW 11/12/2023, 10:39 AM

## 2023-11-12 NOTE — Progress Notes (Signed)
Patient pleasant and cooperative on approach. Denies SI,HI and AVH. Verbalized understanding discharge instructions,prescriptions and follow up care. 7 days medicines given to patient. All belongings returned from [employer]. Suicide safety plan filled by patient and placed in chart. Copy given to patient.Patient escorted out by staff and transported by friend.

## 2023-11-12 NOTE — Progress Notes (Signed)
[facility] MD Progress Note  11/11/2023 12:28 PM Samantha Hunter  MRN:  [id number] Subjective:  22 year old transgender Hispanic female who denies suicidal ideation (SI) and homicidal ideation (HI). The patient reports feeling "sleepy today" and has requested that medications be administered at night for better symptom management. The patient is awaiting discharge and expresses willingness to comply with the discharge plan. Principal Problem: Bipolar 1 disorder (HCC) Diagnosis: Principal Problem:   Bipolar 1 disorder (HCC) Active Problems:   Elevated BP without diagnosis of hypertension   Cannabis use disorder, mild, abuse   Suicidal ideation   Cocaine abuse (HCC)  Total Time spent with patient: 20 minutes  Past Psychiatric History: Gender Dysphoria Anxiety Polysubstance Abuse   Past Medical History:  Past Medical History:  Diagnosis Date   Allergy    Anemia    Anxiety    Asthma    Cannabis use disorder, mild, abuse 03/16/2017   Depression    Gender dysphoria 05/12/2017   GERD (gastroesophageal reflux disease)    Non-suicidal self harm 03/16/2017   OCD (obsessive compulsive disorder) 03/16/2017   Suicidal ideation 03/16/2017    Past Surgical History:  Procedure Laterality Date   wisdom tooth removal Bilateral    Family History:  Family History  Problem Relation Age of Onset   Mental illness Mother    Alcohol abuse Mother    Drug abuse Mother    Depression Mother    Hyperlipidemia Mother    Mental illness Father    Alcohol abuse Father    Drug abuse Father    Depression Father    Cancer Father        skin   Heart disease Father    Hyperlipidemia Father    Stroke Father    Mental illness Brother    Anxiety disorder Brother    Depression Brother    Anxiety disorder Sister    Depression Sister    Mental illness Sister    Alcohol abuse Maternal Uncle    Drug abuse Maternal Uncle    Alcohol abuse Maternal Grandfather    Mental illness Brother    Mental illness Sister     Family Psychiatric  History: see above Social History:  Social History   Substance and Sexual Activity  Alcohol Use Not Currently     Social History   Substance and Sexual Activity  Drug Use Not Currently   Frequency: 14.0 times per week   Types: Marijuana, Cocaine   Comment: "Coke, weed, and xanax"    Social History   Socioeconomic History   Marital status: Single    Spouse name: Not on file   Number of children: Not on file   Years of education: Not on file   Highest education level: Not on file  Occupational History   Not on file  Tobacco Use   Smoking status: Every Day    Current packs/day: 0.25    Average packs/day: 0.3 packs/day for 5.9 years (1.5 ttl pk-yrs)    Types: Cigarettes    Start date: 02/08/2017    Last attempt to quit: 02/08/2018   Smokeless tobacco: Former    Types: Chew   Tobacco comments:    Declined cessation  Vaping Use   Vaping status: Every Day  Substance and Sexual Activity   Alcohol use: Not Currently   Drug use: Not Currently    Frequency: 14.0 times per week    Types: Marijuana, Cocaine    Comment: "Coke, weed, and xanax"   Sexual activity: Yes  Birth control/protection: Surgical, Other-see comments    Comment: spouse has had vasectomy, pt amenorrheic on testosterone  Other Topics Concern   Not on file  Social History Narrative   Not on file   Social Determinants of Health   Financial Resource Strain: Not on file  Food Insecurity: No Food Insecurity (11/06/2023)   Hunger Vital Sign    Worried About Running Out of Food in the Last Year: Never true    Ran Out of Food in the Last Year: Never true  Transportation Needs: No Transportation Needs (11/06/2023)   PRAPARE - Administrator, Civil Service (Medical): No    Lack of Transportation (Non-Medical): No  Physical Activity: Not on file  Stress: Not on file  Social Connections: Not on file   Additional Social History:  Specify valuables returned: 3 bracelets,1  cell phone,keys ,vape,lighter,wallet,pocket knife and back pack intact.                      Sleep: Good  Appetite:  Good  Current Medications: Current Facility-Administered Medications  Medication Dose Route Frequency Provider Last Rate Last Admin   albuterol (VENTOLIN HFA) 108 (90 Base) MCG/ACT inhaler 2 puff  2 puff Inhalation Q4H PRN Motley-Mangrum, Jadeka A, PMHNP       alum & mag hydroxide-simeth (MAALOX/MYLANTA) 200-200-20 MG/5ML suspension 30 mL  30 mL Oral Q4H PRN Motley-Mangrum, Jadeka A, PMHNP       buPROPion (WELLBUTRIN) tablet 75 mg  75 mg Oral Daily Myriam Forehand, NP   75 mg at 11/12/23 0808   divalproex (DEPAKOTE ER) 24 hr tablet 500 mg  500 mg Oral BID Myriam Forehand, NP   500 mg at 11/12/23 1610   hydrOXYzine (ATARAX) tablet 50 mg  50 mg Oral Q6H PRN Myriam Forehand, NP   50 mg at 11/12/23 0904   ibuprofen (ADVIL) tablet 800 mg  800 mg Oral Q8H PRN Myriam Forehand, NP   800 mg at 11/11/23 1751   magnesium hydroxide (MILK OF MAGNESIA) suspension 30 mL  30 mL Oral Daily PRN Motley-Mangrum, Jadeka A, PMHNP       mometasone-formoterol (DULERA) 200-5 MCG/ACT inhaler 2 puff  2 puff Inhalation BID Motley-Mangrum, Jadeka A, PMHNP       nicotine (NICODERM CQ - dosed in mg/24 hours) patch 21 mg  21 mg Transdermal Daily Motley-Mangrum, Jadeka A, PMHNP   21 mg at 11/12/23 0904   nicotine polacrilex (NICORETTE) gum 4 mg  4 mg Oral PRN Myriam Forehand, NP   4 mg at 11/12/23 0808   OLANZapine (ZYPREXA) tablet 7.5 mg  7.5 mg Oral QHS Myriam Forehand, NP   7.5 mg at 11/11/23 2220   topiramate (TOPAMAX) tablet 100 mg  100 mg Oral BID Myriam Forehand, NP   100 mg at 11/12/23 9604   traZODone (DESYREL) tablet 100 mg  100 mg Oral QHS PRN Myriam Forehand, NP   100 mg at 11/11/23 2220    Lab Results: No results found for this or any previous visit (from the past 48 hour(s)).  Blood Alcohol level:  Lab Results  Component Value Date   [facility] <10 11/05/2023   ETH <5 05/11/2017    Metabolic Disorder  Labs: Lab Results  Component Value Date   HGBA1C 5.5 11/05/2023   MPG 111.15 11/05/2023   MPG 108 06/02/2017   No results found for: "PROLACTIN" Lab Results  Component Value Date   CHOL 199  11/05/2023   TRIG 80 11/05/2023   HDL 57 11/05/2023   CHOLHDL 3.5 11/05/2023   VLDL 16 11/05/2023   LDLCALC 126 (H) 11/05/2023   LDLCALC 99 06/02/2017    Physical Findings: AIMS:  , ,  ,  ,    CIWA:    COWS:     Musculoskeletal: Strength & Muscle Tone: within normal limits Gait & Station: normal Patient leans: N/A  Psychiatric Specialty Exam:  Presentation  General Appearance:  Appropriate for Environment  Eye Contact: Minimal  Speech: Clear and Coherent; Normal Rate  Speech Volume: Normal  Handedness: Right   Mood and Affect  Mood: Anxious  Affect: Appropriate   Thought Process  Thought Processes: Coherent  Descriptions of Associations:Circumstantial  Orientation:Full (Time, Place and Person) (and situation)  Thought Content:WDL  History of Schizophrenia/Schizoaffective disorder:No data recorded Duration of Psychotic Symptoms:No data recorded Hallucinations:Hallucinations: None Description of Command Hallucinations: denies Description of Auditory Hallucinations: denies  Ideas of Reference:None  Suicidal Thoughts:Suicidal Thoughts: No SI Active Intent and/or Plan: -- (denies) SI Passive Intent and/or Plan: -- (denies)  Homicidal Thoughts:Homicidal Thoughts: No   Sensorium  Memory: Immediate Good; Remote Good  Judgment: Fair  Insight: Fair   Art therapist  Concentration: Fair  Attention Span: Fair  Recall: Good  Fund of Knowledge: Good  Language: Good   Psychomotor Activity  Psychomotor Activity:Psychomotor Activity: Normal   Assets  Assets: Communication Skills; Financial Resources/Insurance; Social Support; Housing   Sleep  Sleep:Sleep: Good Number of Hours of Sleep: 8    Physical Exam: Physical  Exam Vitals and nursing note reviewed.  Constitutional:      Appearance: Normal appearance.  HENT:     Head: Normocephalic and atraumatic.     Nose: Nose normal.  Pulmonary:     Effort: Pulmonary effort is normal.  Musculoskeletal:        General: Normal range of motion.     Cervical back: Normal range of motion.  Neurological:     General: No focal deficit present.     Mental Status: He is alert. Mental status is at baseline.  Psychiatric:        Attention and Perception: Attention and perception normal.        Mood and Affect: Mood and affect normal.        Speech: Speech normal.        Behavior: Behavior normal. Behavior is cooperative.        Thought Content: Thought content normal.        Cognition and Memory: Cognition and memory normal.        Judgment: Judgment normal.    Review of Systems  All other systems reviewed and are negative.  Blood pressure 102/60, pulse 76, temperature 98.6 F (37 C), resp. rate 18, height 5\' 5"  (1.651 m), weight 118.4 kg, SpO2 99%. Body mass index is 43.43 kg/m.   Treatment Plan Summary: Daily contact with patient to assess and evaluate symptoms and progress in treatment and Medication management Olanzapine 7.5 mg, nightly for mood stabilization and management of suicidal ideation Divalproex ER (Depakote ER) 500 mg oral BID Wellbutrin 75ng daily Mood stabilization and management of bipolar disorder Topiramate (Topamax) 100 mg oral BID Adjunctive therapy for mood stabilization and possibly for reducing cravings or supporting mental clarity in bipolar disorder or substance use disorders. Myriam Forehand, NP 11/12/2023, 12:28 PM

## 2023-11-12 NOTE — BHH Suicide Risk Assessment (Addendum)
[facility] Discharge Suicide Risk Assessment   Principal Problem: Bipolar 1 disorder (HCC) Discharge Diagnoses: Principal Problem:   Bipolar 1 disorder (HCC) Active Problems:   Elevated BP without diagnosis of hypertension   Cannabis use disorder, mild, abuse   Suicidal ideation   Cocaine abuse (HCC)   Total Time spent with patient: 2 hours  Musculoskeletal: Strength & Muscle Tone: within normal limits Gait & Station: normal Patient leans: N/A  Psychiatric Specialty Exam  Presentation  General Appearance:  Appropriate for Environment  Eye Contact: Minimal  Speech: Clear and Coherent; Normal Rate  Speech Volume: Normal  Handedness: Right   Mood and Affect  Mood: Anxious  Duration of Depression Symptoms: No data recorded Affect: Appropriate   Thought Process  Thought Processes: Coherent  Descriptions of Associations:Circumstantial  Orientation:Full (Time, Place and Person) (and situation)  Thought Content:WDL  History of Schizophrenia/Schizoaffective disorder:none reported Duration of Psychotic Symptoms:none reported Hallucinations:Hallucinations: None Description of Command Hallucinations: denies Description of Auditory Hallucinations: denies  Ideas of Reference:None  Suicidal Thoughts:Suicidal Thoughts: No SI Active Intent and/or Plan: -- (denies) SI Passive Intent and/or Plan: -- (denies)  Homicidal Thoughts:Homicidal Thoughts: No   Sensorium  Memory: Immediate Good; Remote Good  Judgment: Fair  Insight: Fair   Art therapist  Concentration: Fair  Attention Span: Fair  Recall: Good  Fund of Knowledge: Good  Language: Good   Psychomotor Activity  Psychomotor Activity: Psychomotor Activity: Normal   Assets  Assets: Communication Skills; Financial Resources/Insurance; Social Support; Housing   Sleep  Sleep: Sleep: Good Number of Hours of Sleep: 8   Physical Exam: Physical Exam Vitals and nursing note  reviewed.  Constitutional:      Appearance: Normal appearance.  HENT:     Head: Normocephalic and atraumatic.     Nose: Nose normal.  Pulmonary:     Effort: Pulmonary effort is normal.  Musculoskeletal:        General: Normal range of motion.     Cervical back: Normal range of motion.  Neurological:     General: No focal deficit present.     Mental Status: He is alert. Mental status is at baseline.  Psychiatric:        Attention and Perception: Attention and perception normal.        Mood and Affect: Mood and affect normal.        Speech: Speech normal.        Behavior: Behavior normal. Behavior is cooperative.        Thought Content: Thought content normal.        Cognition and Memory: Cognition and memory normal.        Judgment: Judgment normal.    ROS Blood pressure 102/60, pulse 76, temperature 98.6 F (37 C), resp. rate 18, height 5\' 5"  (1.651 m), weight 118.4 kg, SpO2 99%. Body mass index is 43.43 kg/m.  Mental Status Per Nursing Assessment::   On Admission:  Suicidal ideation indicated by patient  Demographic Factors:  Female, Adolescent or young adult, Gay, lesbian, or bisexual orientation, and Low socioeconomic status  Loss Factors: Loss of significant relationship  Historical Factors: Family history of suicide, Family history of mental illness or substance abuse, and Impulsivity  Risk Reduction Factors:   Sense of responsibility to family, Employed, Living with another person, especially a relative, Positive social support, Positive therapeutic relationship, and Positive coping skills or problem solving skills  Continued Clinical Symptoms:  Bipolar Disorder:   Bipolar II Depression:   Comorbid alcohol abuse/dependence Impulsivity Insomnia  Alcohol/Substance Abuse/Dependencies Medical Diagnoses and Treatments/Surgeries  Cognitive Features That Contribute To Risk:  None    Suicide Risk:  Minimal: No identifiable suicidal ideation.  Patients presenting  with no risk factors but with morbid ruminations; may be classified as minimal risk based on the severity of the depressive symptoms   Follow-up Information     [facility]. Go to.   Specialty: Behavioral Health Why: Appointment for 11/13/2023 at 10 AM. They will call following discharge to confirm appointment time. Contact information: [address] [place] [address] [phone]                 Plan Of Care/Follow-up recommendations:  Activity:  as tolerated Diet:  heart healthy Olanzapine (Zyprexa): 7.5 mg nightly for mood stabilization and management of suicidal ideation. Divalproex ER (Depakote ER): 500 mg BID for bipolar disorder. Wellbutrin (Bupropion): 75 mg daily for depression. Topiramate (Topamax): 100 mg BID for adjunctive mood stabilization. 988 Suicide & Crisis Lifeline: Available 24/7 for immediate support. Mobile Crisis Unit: [phone] [facility]). [facility] Emergency Department at [facility]: [address] [place], [place], [place] [address]; Phone: [phone]. [facility]: Ensure continued appointments for outpatient therapy and psychiatry [facility]: Specialty: Behavioral Health Address: [address] [place], [place], [place] [address] Phone: [phone] Appointment: Scheduled for 11/13/2023 at 10 AM. The patient has been instructed to confirm the appointment following discharge.  Myriam Forehand, NP 11/12/2023, 12:53 PM

## 2024-04-22 ENCOUNTER — Encounter (HOSPITAL_COMMUNITY): Payer: Self-pay | Admitting: Pharmacy Technician

## 2024-04-22 ENCOUNTER — Emergency Department (HOSPITAL_COMMUNITY)
Admission: EM | Admit: 2024-04-22 | Discharge: 2024-04-22 | Disposition: A | Discharge status: Home / Self Care | Attending: Emergency Medicine | Admitting: Emergency Medicine

## 2024-04-22 ENCOUNTER — Other Ambulatory Visit: Payer: Self-pay

## 2024-04-22 DIAGNOSIS — R519 Headache, unspecified: Secondary | ICD-10-CM | POA: Insufficient documentation

## 2024-04-22 MED ORDER — PROCHLORPERAZINE EDISYLATE 10 MG/2ML IJ SOLN
10.0000 mg | Freq: Once | INTRAMUSCULAR | Status: AC
  Administered 2024-04-22: 10 mg via INTRAVENOUS
  Filled 2024-04-22: qty 2

## 2024-04-22 MED ORDER — SODIUM CHLORIDE 0.9 % IV BOLUS
1000.0000 mL | Freq: Once | INTRAVENOUS | Status: AC
  Administered 2024-04-22: 1000 mL via INTRAVENOUS

## 2024-04-22 MED ORDER — KETOROLAC TROMETHAMINE 30 MG/ML IJ SOLN
15.0000 mg | Freq: Once | INTRAMUSCULAR | Status: AC
  Administered 2024-04-22: 15 mg via INTRAVENOUS
  Filled 2024-04-22: qty 1

## 2024-04-22 MED ORDER — DEXAMETHASONE SODIUM PHOSPHATE 10 MG/ML IJ SOLN
10.0000 mg | Freq: Once | INTRAMUSCULAR | Status: AC
  Administered 2024-04-22: 10 mg via INTRAVENOUS
  Filled 2024-04-22: qty 1

## 2024-04-22 NOTE — ED Triage Notes (Signed)
 Pt here with reports of migraine for the last week. Pt has taken otc meds without any relief. Endorses nausea and blurry vision.

## 2024-04-22 NOTE — Discharge Instructions (Addendum)
 As discussed, your evaluation today has been largely reassuring.  But, it is important that you monitor your condition carefully, and do not hesitate to return to the ED if you develop new, or concerning changes in your condition. ? ?Otherwise, please follow-up with your physician for appropriate ongoing care. ? ?

## 2024-04-22 NOTE — ED Provider Notes (Signed)
 [place] EMERGENCY DEPARTMENT AT [facility] Provider Note   CSN: [id number] Arrival date & time: 04/22/24  1257     History  Chief Complaint  Patient presents with   Migraine    Samantha Hunter is a 23 y.o. adult.  HPI Presents with headache.  He has a history of migraines, has been worse than usual, longer duration with associated photophobia blurry vision and nausea.  No weakness.  No relief with Ubrelvy    Home Medications Prior to Admission medications   Medication Sig Start Date End Date Taking? Authorizing Provider  albuterol  (PROVENTIL  HFA;VENTOLIN  HFA) 108 (90 Base) MCG/ACT inhaler Inhale 2 puffs into the lungs every 4 (four) hours as needed for wheezing or shortness of breath (cough, shortness of breath or wheezing.). 12/06/18   Cranston Dk, MD  budesonide -formoterol  (SYMBICORT ) 160-4.5 MCG/ACT inhaler Inhale 2 puffs into the lungs 2 (two) times daily. Patient not taking: Reported on 11/06/2023 12/06/18   Cranston Dk, MD  buPROPion  (WELLBUTRIN ) 75 MG tablet Take 1 tablet (75 mg total) by mouth daily for 10 days. 11/10/23 11/20/23  Rosalene Colon, NP  divalproex  (DEPAKOTE  ER) 500 MG 24 hr tablet Take 500 mg by mouth at bedtime. 10/30/23   [provider]  divalproex  (DEPAKOTE  ER) 500 MG 24 hr tablet Take 1 tablet (500 mg total) by mouth 2 (two) times daily for 10 days. 11/12/23 11/22/23  Rosalene Colon, NP  fluticasone -salmeterol (WIXELA INHUB ) 500-50 MCG/ACT AEPB Inhale 1 puff into the lungs in the morning and at bedtime for 10 days. 11/09/23 12/09/23  Rosalene Colon, NP  ibuprofen  (ADVIL ) 800 MG tablet Take 1 tablet (800 mg total) by mouth every 8 (eight) hours as needed for up to 10 days for moderate pain (pain score 4-6) or headache. 11/09/23 11/19/23  Rosalene Colon, NP  OLANZapine  (ZYPREXA ) 7.5 MG tablet Take 1 tablet (7.5 mg total) by mouth at bedtime for 10 days. 11/12/23 11/22/23  Rosalene Colon, NP  topiramate  (TOPAMAX ) 100 MG tablet Take 1 tablet  (100 mg total) by mouth 2 (two) times daily for 10 days. 11/09/23 11/19/23  Rosalene Colon, NP  traZODone  (DESYREL ) 100 MG tablet Take 1 tablet (100 mg total) by mouth at bedtime as needed for up to 10 days for sleep. 11/09/23 11/19/23  Rosalene Colon, NP      Allergies    Coconut (cocos nucifera), Mango butter, and Pineapple    Review of Systems   Review of Systems  Physical Exam Updated Vital Signs BP 95/60 (BP Location: Left Arm)   Pulse 72   Temp 98.8 F (37.1 C)   Resp 16   SpO2 100%  Physical Exam Vitals and nursing note reviewed.  Constitutional:      General: He is not in acute distress.    Appearance: He is well-developed.  HENT:     Head: Normocephalic and atraumatic.  Eyes:     Conjunctiva/sclera: Conjunctivae normal.  Pulmonary:     Effort: Pulmonary effort is normal. No respiratory distress.     Breath sounds: No stridor.  Abdominal:     General: There is no distension.  Skin:    General: Skin is warm and dry.  Neurological:     General: No focal deficit present.     Mental Status: He is alert and oriented to person, place, and time.     ED Results / Procedures / Treatments   Labs (all labs ordered are listed, but  only abnormal results are displayed) Labs Reviewed - No data to display  EKG None  Radiology No results found.  Procedures Procedures    Medications Ordered in ED Medications  sodium chloride  0.9 % bolus 1,000 mL (1,000 mLs Intravenous New Bag/Given 04/22/24 1510)  ketorolac  (TORADOL ) 30 MG/ML injection 15 mg (15 mg Intravenous Given 04/22/24 1508)  dexamethasone  (DECADRON ) injection 10 mg (10 mg Intravenous Given 04/22/24 1507)  prochlorperazine (COMPAZINE) injection 10 mg (10 mg Intravenous Given 04/22/24 1508)    ED Course/ Medical Decision Making/ A&P                                 Medical Decision Making Patient with history of migraines presents with ongoing headache, unresponsive to OTC and home meds.  Patient is awake, alert,  moving all extremity spontaneously, has no evidence for distress, nor neurologic dysfunction.  Suspicion for severe migraine, less likely intracranial abnormality given preserved vitals, neurovascular status. Analgesics and fluids started.  Amount and/or Complexity of Data Reviewed External Data Reviewed: notes.  Risk Prescription drug management. Decision regarding hospitalization. Diagnosis or treatment significantly limited by social determinants of health.   3:57 PM Patient in no distress, feeling better.  We discussed presentation, need for follow-up with primary care, and absent new features, with reassuring vital signs, patient discharged following meds.        Final Clinical Impression(s) / ED Diagnoses Final diagnoses:  Bad headache    Rx / DC Orders ED Discharge Orders     None         Dorenda Gandy, MD 04/22/24 1557

## 2024-07-13 ENCOUNTER — Other Ambulatory Visit: Payer: Self-pay

## 2024-07-13 ENCOUNTER — Encounter (HOSPITAL_COMMUNITY): Payer: Self-pay | Admitting: Psychiatry

## 2024-07-13 ENCOUNTER — Ambulatory Visit (HOSPITAL_COMMUNITY)
Admission: EM | Admit: 2024-07-13 | Discharge: 2024-07-13 | Disposition: A | Discharge status: Other Acute Inpatient Hospital | Source: Other Acute Inpatient Hospital | Attending: Psychiatry | Admitting: Psychiatry

## 2024-07-13 ENCOUNTER — Inpatient Hospital Stay (HOSPITAL_COMMUNITY)
Admission: AD | Admit: 2024-07-13 | Discharge: 2024-07-22 | DRG: 885 | Disposition: A | Discharge status: Home / Self Care | Source: Intra-hospital | Attending: Psychiatry | Admitting: Psychiatry

## 2024-07-13 DIAGNOSIS — F17290 Nicotine dependence, other tobacco product, uncomplicated: Secondary | ICD-10-CM | POA: Diagnosis present

## 2024-07-13 DIAGNOSIS — Z62810 Personal history of physical and sexual abuse in childhood: Secondary | ICD-10-CM

## 2024-07-13 DIAGNOSIS — Z818 Family history of other mental and behavioral disorders: Secondary | ICD-10-CM

## 2024-07-13 DIAGNOSIS — F17210 Nicotine dependence, cigarettes, uncomplicated: Secondary | ICD-10-CM | POA: Diagnosis present

## 2024-07-13 DIAGNOSIS — Z83438 Family history of other disorder of lipoprotein metabolism and other lipidemia: Secondary | ICD-10-CM | POA: Diagnosis not present

## 2024-07-13 DIAGNOSIS — F603 Borderline personality disorder: Secondary | ICD-10-CM | POA: Diagnosis present

## 2024-07-13 DIAGNOSIS — Z79899 Other long term (current) drug therapy: Secondary | ICD-10-CM | POA: Diagnosis not present

## 2024-07-13 DIAGNOSIS — Z811 Family history of alcohol abuse and dependence: Secondary | ICD-10-CM

## 2024-07-13 DIAGNOSIS — Z7951 Long term (current) use of inhaled steroids: Secondary | ICD-10-CM

## 2024-07-13 DIAGNOSIS — K219 Gastro-esophageal reflux disease without esophagitis: Secondary | ICD-10-CM | POA: Diagnosis present

## 2024-07-13 DIAGNOSIS — Z825 Family history of asthma and other chronic lower respiratory diseases: Secondary | ICD-10-CM | POA: Diagnosis not present

## 2024-07-13 DIAGNOSIS — Z62811 Personal history of psychological abuse in childhood: Secondary | ICD-10-CM | POA: Diagnosis not present

## 2024-07-13 DIAGNOSIS — Z9151 Personal history of suicidal behavior: Secondary | ICD-10-CM

## 2024-07-13 DIAGNOSIS — Z8249 Family history of ischemic heart disease and other diseases of the circulatory system: Secondary | ICD-10-CM | POA: Diagnosis not present

## 2024-07-13 DIAGNOSIS — F329 Major depressive disorder, single episode, unspecified: Principal | ICD-10-CM | POA: Diagnosis present

## 2024-07-13 DIAGNOSIS — Z823 Family history of stroke: Secondary | ICD-10-CM

## 2024-07-13 DIAGNOSIS — F410 Panic disorder [episodic paroxysmal anxiety] without agoraphobia: Secondary | ICD-10-CM | POA: Diagnosis present

## 2024-07-13 DIAGNOSIS — R45851 Suicidal ideations: Secondary | ICD-10-CM | POA: Diagnosis present

## 2024-07-13 DIAGNOSIS — F319 Bipolar disorder, unspecified: Secondary | ICD-10-CM

## 2024-07-13 DIAGNOSIS — F1510 Other stimulant abuse, uncomplicated: Secondary | ICD-10-CM | POA: Diagnosis present

## 2024-07-13 DIAGNOSIS — F315 Bipolar disorder, current episode depressed, severe, with psychotic features: Secondary | ICD-10-CM | POA: Insufficient documentation

## 2024-07-13 DIAGNOSIS — E669 Obesity, unspecified: Secondary | ICD-10-CM | POA: Diagnosis present

## 2024-07-13 DIAGNOSIS — F3181 Bipolar II disorder: Secondary | ICD-10-CM | POA: Diagnosis present

## 2024-07-13 DIAGNOSIS — G4700 Insomnia, unspecified: Secondary | ICD-10-CM | POA: Diagnosis present

## 2024-07-13 DIAGNOSIS — Z9152 Personal history of nonsuicidal self-harm: Secondary | ICD-10-CM

## 2024-07-13 DIAGNOSIS — Z6841 Body Mass Index (BMI) 40.0 and over, adult: Secondary | ICD-10-CM | POA: Diagnosis not present

## 2024-07-13 DIAGNOSIS — F640 Transsexualism: Secondary | ICD-10-CM | POA: Diagnosis present

## 2024-07-13 DIAGNOSIS — F429 Obsessive-compulsive disorder, unspecified: Secondary | ICD-10-CM | POA: Diagnosis present

## 2024-07-13 DIAGNOSIS — Z813 Family history of other psychoactive substance abuse and dependence: Secondary | ICD-10-CM

## 2024-07-13 LAB — LIPID PANEL
Cholesterol: 151 mg/dL (ref 0–200)
HDL: 53 mg/dL (ref >40)
LDL Cholesterol: 86 mg/dL (ref 0–99)
Total CHOL/HDL Ratio: 2.8 ratio
Triglycerides: 59 mg/dL (ref <150)
VLDL: 12 mg/dL (ref 0–40)

## 2024-07-13 LAB — CBC WITH DIFFERENTIAL/PLATELET
Abs Immature Granulocytes: 0.01 K/uL (ref 0.00–0.07)
Basophils Absolute: 0.1 K/uL (ref 0.0–0.1)
Basophils Relative: 1 %
Eosinophils Absolute: 0.2 K/uL (ref 0.0–0.5)
Eosinophils Relative: 4 %
HCT: 40.3 % (ref 36.0–46.0)
Hemoglobin: 13 g/dL (ref 12.0–15.0)
Immature Granulocytes: 0 %
Lymphocytes Relative: 49 %
Lymphs Abs: 2.8 K/uL (ref 0.7–4.0)
MCH: 29.5 pg (ref 26.0–34.0)
MCHC: 32.3 g/dL (ref 30.0–36.0)
MCV: 91.6 fL (ref 80.0–100.0)
Monocytes Absolute: 0.4 K/uL (ref 0.1–1.0)
Monocytes Relative: 7 %
Neutro Abs: 2.2 K/uL (ref 1.7–7.7)
Neutrophils Relative %: 39 %
Platelets: 345 K/uL (ref 150–400)
RBC: 4.4 MIL/uL (ref 3.87–5.11)
RDW: 14.2 % (ref 11.5–15.5)
WBC: 5.7 K/uL (ref 4.0–10.5)
nRBC: 0 % (ref 0.0–0.2)

## 2024-07-13 LAB — ETHANOL: Alcohol, Ethyl (B): <15 mg/dL (ref <15)

## 2024-07-13 LAB — COMPREHENSIVE METABOLIC PANEL WITH GFR
ALT: 18 U/L (ref 0–44)
AST: 17 U/L (ref 15–41)
Albumin: 3.2 g/dL — ABNORMAL LOW (ref 3.5–5.0)
Alkaline Phosphatase: 42 U/L (ref 38–126)
Anion gap: 7 (ref 5–15)
BUN: 8 mg/dL (ref 6–20)
CO2: 24 mmol/L (ref 22–32)
Calcium: 8.6 mg/dL — ABNORMAL LOW (ref 8.9–10.3)
Chloride: 105 mmol/L (ref 98–111)
Creatinine, Ser: 0.75 mg/dL (ref 0.44–1.00)
GFR, Estimated: >60 mL/min (ref >60)
Glucose, Bld: 96 mg/dL (ref 70–99)
Potassium: 4.1 mmol/L (ref 3.5–5.1)
Sodium: 136 mmol/L (ref 135–145)
Total Bilirubin: 0.3 mg/dL (ref 0.0–1.2)
Total Protein: 6.3 g/dL — ABNORMAL LOW (ref 6.5–8.1)

## 2024-07-13 LAB — TSH: TSH: 1.998 u[IU]/mL (ref 0.350–4.500)

## 2024-07-13 MED ORDER — ACETAMINOPHEN 325 MG PO TABS: 650.0000 mg | ORAL_TABLET | Freq: Four times a day (QID) | ORAL | Status: DC | PRN

## 2024-07-13 MED ORDER — HALOPERIDOL LACTATE 5 MG/ML IJ SOLN: 10.0000 mg | Freq: Three times a day (TID) | INTRAMUSCULAR | Status: DC | PRN

## 2024-07-13 MED ORDER — ENSURE PLUS HIGH PROTEIN PO LIQD
237.0000 mL | Freq: Two times a day (BID) | ORAL | Status: DC
  Filled 2024-07-13 (×4): qty 237

## 2024-07-13 MED ORDER — TRAZODONE HCL 50 MG PO TABS: 50.0000 mg | ORAL_TABLET | Freq: Every evening | ORAL | Status: DC | PRN

## 2024-07-13 MED ORDER — LORAZEPAM 2 MG/ML IJ SOLN: 2.0000 mg | Freq: Three times a day (TID) | INTRAMUSCULAR | Status: DC | PRN

## 2024-07-13 MED ORDER — NICOTINE 14 MG/24HR TD PT24
14.0000 mg | MEDICATED_PATCH | Freq: Every day | TRANSDERMAL | Status: DC
  Administered 2024-07-13: 14 mg via TRANSDERMAL
  Filled 2024-07-13: qty 1

## 2024-07-13 MED ORDER — DIPHENHYDRAMINE HCL 50 MG/ML IJ SOLN: 50.0000 mg | Freq: Three times a day (TID) | INTRAMUSCULAR | Status: DC | PRN

## 2024-07-13 MED ORDER — DIPHENHYDRAMINE HCL 25 MG PO CAPS
50.0000 mg | ORAL_CAPSULE | Freq: Three times a day (TID) | ORAL | Status: DC | PRN
  Administered 2024-07-18 – 2024-07-21 (×3): 50 mg via ORAL
  Filled 2024-07-13 (×2): qty 2

## 2024-07-13 MED ORDER — HALOPERIDOL 5 MG PO TABS
5.0000 mg | ORAL_TABLET | Freq: Three times a day (TID) | ORAL | Status: DC | PRN
Start: 2024-07-13 — End: 2024-07-13

## 2024-07-13 MED ORDER — ALUM & MAG HYDROXIDE-SIMETH 200-200-20 MG/5ML PO SUSP: 30.0000 mL | ORAL | Status: DC | PRN

## 2024-07-13 MED ORDER — HYDROXYZINE HCL 25 MG PO TABS
25.0000 mg | ORAL_TABLET | Freq: Three times a day (TID) | ORAL | Status: DC | PRN
  Administered 2024-07-13 – 2024-07-21 (×14): 25 mg via ORAL
  Filled 2024-07-13 (×13): qty 1

## 2024-07-13 MED ORDER — NICOTINE 14 MG/24HR TD PT24
14.0000 mg | MEDICATED_PATCH | Freq: Every day | TRANSDERMAL | Status: DC
  Administered 2024-07-14: 14 mg via TRANSDERMAL
  Filled 2024-07-13: qty 1

## 2024-07-13 MED ORDER — MAGNESIUM HYDROXIDE 400 MG/5ML PO SUSP: 30.0000 mL | Freq: Every day | ORAL | Status: DC | PRN

## 2024-07-13 MED ORDER — HALOPERIDOL LACTATE 5 MG/ML IJ SOLN: 5.0000 mg | Freq: Three times a day (TID) | INTRAMUSCULAR | Status: DC | PRN

## 2024-07-13 MED ORDER — NICOTINE POLACRILEX 2 MG MT GUM
2.0000 mg | CHEWING_GUM | OROMUCOSAL | Status: DC | PRN
  Administered 2024-07-13: 2 mg via ORAL
  Filled 2024-07-13: qty 1

## 2024-07-13 MED ORDER — NICOTINE POLACRILEX 2 MG MT GUM
2.0000 mg | CHEWING_GUM | Freq: Once | OROMUCOSAL | Status: AC
  Administered 2024-07-13: 2 mg via ORAL
  Filled 2024-07-13: qty 1

## 2024-07-13 MED ORDER — DIPHENHYDRAMINE HCL 50 MG PO CAPS: 50.0000 mg | ORAL_CAPSULE | Freq: Three times a day (TID) | ORAL | Status: DC | PRN

## 2024-07-13 MED ORDER — HALOPERIDOL 5 MG PO TABS
5.0000 mg | ORAL_TABLET | Freq: Three times a day (TID) | ORAL | Status: DC | PRN
  Administered 2024-07-18 – 2024-07-21 (×3): 5 mg via ORAL
  Filled 2024-07-13 (×2): qty 1

## 2024-07-13 MED ORDER — NICOTINE POLACRILEX 2 MG MT GUM
2.0000 mg | CHEWING_GUM | OROMUCOSAL | Status: DC | PRN
  Administered 2024-07-13 – 2024-07-14 (×3): 2 mg via ORAL
  Filled 2024-07-13: qty 1

## 2024-07-13 MED ORDER — HYDROXYZINE HCL 25 MG PO TABS: 25.0000 mg | ORAL_TABLET | Freq: Three times a day (TID) | ORAL | Status: DC | PRN

## 2024-07-13 MED ORDER — NICOTINE POLACRILEX 2 MG MT GUM: 2.0000 mg | CHEWING_GUM | OROMUCOSAL | Status: DC

## 2024-07-13 MED ORDER — TRAZODONE HCL 50 MG PO TABS
50.0000 mg | ORAL_TABLET | Freq: Every evening | ORAL | Status: DC | PRN
  Filled 2024-07-13: qty 1

## 2024-07-13 NOTE — Tx Team (Signed)
 Initial Treatment Plan 07/13/2024 2:36 PM Samantha Hunter [id number]    PATIENT STRESSORS: Marital or family conflict   Medication change or noncompliance     PATIENT STRENGTHS: Communication skills  General fund of knowledge  Physical Health  Supportive family/friends    PATIENT IDENTIFIED PROBLEMS: I've been very erratic  I need to fix my meds                   DISCHARGE CRITERIA:  Improved stabilization in mood, thinking, and/or behavior Motivation to continue treatment in a less acute level of care  PRELIMINARY DISCHARGE PLAN: Attend PHP/IOP Attend 12-step recovery group  PATIENT/FAMILY INVOLVEMENT: This treatment plan has been presented to and reviewed with the patient, Samantha Hunter.  The patient has been given the opportunity to ask questions and make suggestions.  Annalee Larch, RN 07/13/2024, 2:36 PM

## 2024-07-13 NOTE — ED Notes (Signed)
 Patient A&O x 4, ambulatory. Patient transferred to [facility] in no acute distress. Pt belongings given to safe transport driver from locker #80 intact. Patient escorted to [place] via staff for transport to destination. Safety maintained.

## 2024-07-13 NOTE — Plan of Care (Signed)
  Problem: Education: Goal: Emotional status will improve Outcome: Progressing Goal: Verbalization of understanding the information provided will improve Outcome: Progressing   Problem: Activity: Goal: Sleeping patterns will improve Outcome: Progressing

## 2024-07-13 NOTE — ED Notes (Signed)
 Unable to get labs on pt before coming on unit.  Pt stated they already went to the bathroom.  Pt was stuck 3x for blood work.  NP notified and we will try in the am.
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
## 2024-07-13 NOTE — Progress Notes (Signed)
 B[facility]/BMU LCSW Progress Note   07/13/2024    11:21 AM  Samantha Hunter   [id number]   Type of Contact and Topic:  Psychiatric Bed Placement   Pt accepted to [facility] 407-1  Patient meets inpatient criteria per Kathryne Show, NP  The attending provider will be Dr. Raliegh  Call report to [phone]    Lajuana Nett, RN @ [facility] notified.     Pt scheduled  to arrive at [facility] TODAY after 1300.    Johnette Teigen, MSW, LCSW-A  11:22 AM 07/13/2024

## 2024-07-13 NOTE — ED Notes (Signed)
 Pt made aware that he has been accepted at [facility] for continuum of care. Voluntary consent uploaded in media tab.

## 2024-07-13 NOTE — Plan of Care (Signed)
   Problem: Education: Goal: Emotional status will improve Outcome: Progressing Goal: Mental status will improve Outcome: Progressing   Problem: Activity: Goal: Sleeping patterns will improve Outcome: Progressing   Problem: Safety: Goal: Periods of time without injury will increase Outcome: Progressing

## 2024-07-13 NOTE — ED Notes (Signed)
 Pt sleeping in no acute distress. RR even and unlabored. Environment secured. Will continue to monitor for safety.

## 2024-07-13 NOTE — ED Notes (Addendum)
 Patient A&Ox4. Pt continue to endorse SI. Pt states, I can't do anything to myself in here. Give minimal response to questions asked. Flat affect. Pt reports seeing flashes of light upon wakening this am. Patient denies any physical complaints when asked. Support and encouragement provided. Blood drawn from L AC without difficulty. Pt tolerated well. Pt received nicotine  patch to L arm. Routine safety checks conducted according to facility protocol. Encouraged patient to notify staff if thoughts of harm toward self or others arise. Patient verbalize understanding and agreement. Will continue to monitor for safety.

## 2024-07-13 NOTE — Progress Notes (Signed)
 [facility] Admission Note:  Patient is a 23 year old transgender female who presented voluntarily to [facility] for mood swings, irritability and SI with plan and intent to shoot self with a gun. The gun was later removed by a support person. Patient continues to endorse SI and no current HI, or AVH. Patient was experiencing VH in the form of the floor breathing last night. Patient's goal is to get back on meds as the patient stopped taking all medications 2 weeks ago. Patient reports that he thinks the medications were not very effective. Patient's belongings were searched and placed in the locker/ at bedside according to unit policy. Skin was assessed with Turi, MHT with noted numerous superficial lacerations on right upper thigh from self injurious behavior. Orientation packet and fall assessment were reviewed with patient's verbalized understanding. Patient placed as low fall risk due to no recent history of falls. Consents were signed and patient was oriented to the unit. Safety checks were initiated at 15 minute intervals.
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
## 2024-07-13 NOTE — Progress Notes (Signed)
   07/13/24 2100  Psych Admission Type (Psych Patients Only)  Admission Status Voluntary  Psychosocial Assessment  Patient Complaints Irritability;Agitation;Other (Comment) (Self injurous behavior)  Eye Contact Brief  Facial Expression Anxious;Flat  Affect Blunted;Preoccupied  Speech Logical/coherent;Soft  Interaction Cautious  Motor Activity Slow  Appearance/Hygiene Unremarkable  Behavior Characteristics Cooperative;Appropriate to situation;Calm  Mood Depressed;Anxious;Pleasant  Thought Process  Coherency WDL  Content WDL  Delusions None reported or observed  Perception WDL  Hallucination None reported or observed  Judgment Impaired  Confusion None  Danger to Self  Current suicidal ideation? Passive  Description of Suicide Plan No plan  Self-Injurious Behavior Self-injurious ideation with potentially lethal plan observed or expressed  Agreement Not to Harm Self Yes  Description of Agreement Verbal agreement
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
## 2024-07-13 NOTE — ED Notes (Signed)
 Pt was calm on admission to the unit. Pt said he came in due to SI with plan to shoot self with a gun. Pt said his Sponsor took his gun away. Pt has a hx of cutting self. He cuts mainly on his right thigh. He has multiple cuts in the process of healing, said he did it with a razor within the past two weeks. Pt was able to verbally contract for safety. Pt was oriented to the unit and his belongings secured per hospital protocol. Staff will monitor pt for safety.

## 2024-07-14 ENCOUNTER — Encounter (HOSPITAL_COMMUNITY): Payer: Self-pay

## 2024-07-14 DIAGNOSIS — F3181 Bipolar II disorder: Secondary | ICD-10-CM | POA: Diagnosis not present

## 2024-07-14 LAB — HEMOGLOBIN A1C
Hgb A1c MFr Bld: 5.4 % (ref 4.8–5.6)
Mean Plasma Glucose: 108 mg/dL

## 2024-07-14 LAB — RAPID URINE DRUG SCREEN, HOSP PERFORMED
Amphetamines: NOT DETECTED
Barbiturates: NOT DETECTED
Benzodiazepines: NOT DETECTED
Cocaine: NOT DETECTED
Opiates: NOT DETECTED
Tetrahydrocannabinol: POSITIVE — AB

## 2024-07-14 LAB — PROLACTIN: Prolactin: 32.1 ng/mL (ref 4.8–33.4)

## 2024-07-14 MED ORDER — BUPROPION HCL ER (XL) 150 MG PO TB24
150.0000 mg | ORAL_TABLET | Freq: Every day | ORAL | Status: DC
  Administered 2024-07-15 – 2024-07-22 (×10): 150 mg via ORAL
  Filled 2024-07-14 (×8): qty 1

## 2024-07-14 MED ORDER — NICOTINE 21 MG/24HR TD PT24
21.0000 mg | MEDICATED_PATCH | Freq: Every day | TRANSDERMAL | Status: DC
  Administered 2024-07-14 – 2024-07-22 (×11): 21 mg via TRANSDERMAL
  Filled 2024-07-14 (×9): qty 1

## 2024-07-14 MED ORDER — ALBUTEROL SULFATE HFA 108 (90 BASE) MCG/ACT IN AERS: 2.0000 | INHALATION_SPRAY | RESPIRATORY_TRACT | Status: DC | PRN

## 2024-07-14 MED ORDER — NICOTINE POLACRILEX 2 MG MT GUM
4.0000 mg | CHEWING_GUM | OROMUCOSAL | Status: DC | PRN
  Administered 2024-07-14 – 2024-07-16 (×12): 4 mg via ORAL
  Administered 2024-07-16: 2 mg via ORAL
  Administered 2024-07-16 – 2024-07-22 (×49): 4 mg via ORAL
  Filled 2024-07-14 (×32): qty 2

## 2024-07-14 MED ORDER — DIVALPROEX SODIUM ER 500 MG PO TB24
500.0000 mg | ORAL_TABLET | Freq: Every day | ORAL | Status: DC
  Administered 2024-07-14 – 2024-07-21 (×9): 500 mg via ORAL
  Filled 2024-07-14 (×8): qty 1

## 2024-07-14 NOTE — Plan of Care (Signed)

## 2024-07-14 NOTE — Group Note (Signed)
 Occupational Therapy Group Note  Group Topic: Sleep Hygiene  Group Date: 07/14/2024 Start Time: 1430 End Time: 1503 Facilitators: Dot Dallas MATSU, OT   Group Description: Group encouraged increased participation and engagement through topic focused on sleep hygiene. Patients reflected on the quality of sleep they typically receive and identified areas that need improvement. Group was given background information on sleep and sleep hygiene, including common sleep disorders. Group members also received information on how to improve one's sleep and introduced a sleep diary as a tool that can be utilized to track sleep quality over a length of time. Group session ended with patients identifying one or more strategies they could utilize or implement into their sleep routine in order to improve overall sleep quality.        Therapeutic Goal(s):  Identify one or more strategies to improve overall sleep hygiene  Identify one or more areas of sleep that are negatively impacted (sleep too much, too little, etc)     Participation Level: Engaged   Participation Quality: Independent   Behavior: Appropriate   Speech/Thought Process: Relevant   Affect/Mood: Appropriate   Insight: Fair   Judgement: Fair      Modes of Intervention: Education  Patient Response to Interventions:  Attentive   Plan: Continue to engage patient in OT groups 2 - 3x/week.  07/14/2024  D[place] MATSU Dot, OT   Samantha Hunter, OT

## 2024-07-14 NOTE — [facility] Suicide Risk Assessment (Signed)
 [facility] Admission Suicide Risk Assessment   Nursing information obtained from:  Patient Demographic factors:  Gay, lesbian, or bisexual orientation Current Mental Status:  Suicidal ideation indicated by patient Loss Factors:  Loss of significant relationship Historical Factors:  Prior suicide attempts Risk Reduction Factors:  Positive social support  Total Time spent with patient: 45 minutes Principal Problem: MDD (major depressive disorder) Diagnosis:  Active Problems:   Bipolar II disorder (HCC)  Subjective Data: see H&P  Continued Clinical Symptoms:  Alcohol Use Disorder Identification Test Final Score (AUDIT): 0 The Alcohol Use Disorders Identification Test, Guidelines for Use in Primary Care, Second Edition.  World Science writer [facility]). Score between 0-7:  no or low risk or alcohol related problems. Score between 8-15:  moderate risk of alcohol related problems. Score between 16-19:  high risk of alcohol related problems. Score 20 or above:  warrants further diagnostic evaluation for alcohol dependence and treatment.   CLINICAL FACTORS:   Bipolar Disorder:   Bipolar II Depressive phase   Musculoskeletal: Strength & Muscle Tone: within normal limits Gait & Station: normal Patient leans: N/A  Psychiatric Specialty Exam:  Presentation  General Appearance:  Appropriate for Environment  Eye Contact: Fair  Speech: Clear and Coherent  Speech Volume: Decreased  Handedness: Right   Mood and Affect  Mood: Anxious; Depressed  Affect: Blunt   Thought Process  Thought Processes: Coherent  Descriptions of Associations:Intact  Orientation:Full (Time, Place and Person)  Thought Content:Logical  History of Schizophrenia/Schizoaffective disorder:No  Duration of Psychotic Symptoms:N/A  Hallucinations:Hallucinations: None  Ideas of Reference:None  Suicidal Thoughts:Suicidal Thoughts: Yes, Active SI Active Intent and/or Plan: With Plan  Homicidal  Thoughts:Homicidal Thoughts: No   Sensorium  Memory: Immediate Fair  Judgment: Fair  Insight: Fair   Chartered certified accountant: Fair  Attention Span: Fair  Recall: [employer] of Knowledge: Fair  Language: Fair   Psychomotor Activity  Psychomotor Activity: Psychomotor Activity: Normal   Assets  Assets: Manufacturing systems engineer; Desire for Improvement; Physical Health; Resilience; Social Support   Sleep  Sleep: Sleep: Poor Number of Hours of Sleep: 4    Physical Exam: Physical Exam ROS Blood pressure 122/82, pulse 65, temperature 98 F (36.7 C), temperature source Oral, resp. rate 16, height 5' 5 (1.651 m), weight 129.5 kg, SpO2 100%. Body mass index is 47.51 kg/m.   COGNITIVE FEATURES THAT CONTRIBUTE TO RISK:  None    SUICIDE RISK:   Severe:  Frequent, intense, and enduring suicidal ideation, specific plan, no subjective intent, but some objective markers of intent (i.e., choice of lethal method), the method is accessible, some limited preparatory behavior, evidence of impaired self-control, severe dysphoria/symptomatology, multiple risk factors present, and few if any protective factors, particularly a lack of social support.  PLAN OF CARE: admit to inpatient psychiatry  I certify that inpatient services furnished can reasonably be expected to improve the patient's condition.   Zakirah Weingart, MD 07/14/2024, 7:31 PM

## 2024-07-14 NOTE — B[facility] Group Notes (Signed)
 Spirituality Group   Group Goal: Support / Education around grief and loss    Group Description: Following introductions and group rules, group members engaged in facilitated group dialog and support around topic of loss, with particular support around experiences of loss in their lives. Group members identified types of loss (relationships / self / things) as well as patterns, circumstances, and changes that precipitate loss. Reflection invited on thoughts / feelings around loss, normalized grief responses, and recognized variety in grief experience. Group noted Worden's four tasks of grief in discussion. Group drew on Adlerian / Rogerian, narrative, MI, with Yalom's group therapy as a primary framework.   Observations: Samantha Hunter had a relatively flat affect and it was unclear how engaged he was in the conversation.  Merryn Thaker L. Fredrica, M.Div [phone]

## 2024-07-14 NOTE — Progress Notes (Signed)
 D: Patient is alert, oriented, and cooperative. Denies SI, HI, AVH, and verbally contracts for safety. Patient reports he slept up and down last night. Patient denies physical symptoms/pain.    A: Scheduled medications administered per MD order. PRN nicotine  gum administered. Support provided. Patient educated on safety on the unit and medications. Routine safety checks every 15 minutes. Patient stated understanding to tell nurse about any new physical symptoms. Patient understands to tell staff of any needs.     R: No adverse drug reactions noted. Patient remains safe at this time and will continue to monitor.    07/14/24 0900  Psych Admission Type (Psych Patients Only)  Admission Status Voluntary  Psychosocial Assessment  Patient Complaints Depression  Eye Contact Brief  Facial Expression Anxious;Flat  Affect Blunted;Preoccupied  Speech Logical/coherent  Interaction Cautious  Motor Activity Slow  Appearance/Hygiene Unremarkable  Behavior Characteristics Cooperative;Appropriate to situation;Calm  Mood Depressed;Anxious  Thought Process  Coherency WDL  Content WDL  Delusions None reported or observed  Perception WDL  Hallucination None reported or observed  Judgment Impaired  Confusion None  Danger to Self  Current suicidal ideation? Denies  Self-Injurious Behavior No self-injurious ideation or behavior indicators observed or expressed   Agreement Not to Harm Self Yes  Description of Agreement verbal  Danger to Others  Danger to Others None reported or observed

## 2024-07-14 NOTE — Plan of Care (Signed)
  Problem: Education: Goal: Emotional status will improve 07/14/2024 0557 by Owen Czar, RN Outcome: Progressing 07/13/2024 2327 by Owen Czar, RN Outcome: Progressing Goal: Mental status will improve 07/14/2024 0557 by Owen Czar, RN Outcome: Progressing 07/13/2024 2327 by Owen Czar, RN Outcome: Progressing   Problem: Activity: Goal: Interest or engagement in activities will improve Outcome: Progressing Goal: Sleeping patterns will improve 07/14/2024 0557 by Owen Czar, RN Outcome: Progressing 07/13/2024 2327 by Owen Czar, RN Outcome: Progressing   Problem: Safety: Goal: Periods of time without injury will increase 07/14/2024 0557 by Owen Czar, RN Outcome: Progressing 07/13/2024 2327 by Owen Czar, RN Outcome: Progressing
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
## 2024-07-14 NOTE — Group Note (Signed)
 Recreation Therapy Group Note   Group Topic:Communication  Group Date: 07/14/2024 Start Time: 0930 End Time: 1015 Facilitators: Marjorie Deprey-McCall, LRT,CTRS Location: [address] Dayroom   Group Topic: Communication, Problem Solving   Goal Area(s) Addresses:  Patient will effectively listen to complete activity.  Patient will identify communication skills used to make activity successful.  Patient will identify how skills used during activity can be used to reach post d/c goals.    Behavioral Response:    Intervention: Building surveyor Activity - Geometric pattern cards, pencils, blank paper    Activity: Geometric Drawings.  Three volunteers from the peer group will be shown an abstract picture with a particular arrangement of geometrical shapes.  Each round, one 'speaker' will describe the pattern, as accurately as possible without revealing the image to the group.  The remaining group members will listen and draw the picture to reflect how it is described to them. Patients with the role of 'listener' cannot ask clarifying questions but, may request that the speaker repeat a direction. Once the drawings are complete, the presenter will show the rest of the group the picture and compare how close each person came to drawing the picture. LRT will facilitate a post-activity discussion regarding effective communication and the importance of planning, listening, and asking for clarification in daily interactions with others.  Education: Environmental consultant, Active listening, Support systems, Discharge planning  Education Outcome: Acknowledges understanding/In group clarification offered/Needs additional education.    Affect/Mood: N/A   Participation Level: Did not attend    Clinical Observations/Individualized Feedback:      Plan: Continue to engage patient in RT group sessions 2-3x/week.   Zaylei Mullane-McCall, LRT,CTRS  07/14/2024 12:20 PM

## 2024-07-14 NOTE — Group Note (Signed)
 Date:  07/14/2024 Time:  9:39 AM  Group Topic/Focus:  Goals Group:   The focus of this group is to help patients establish daily goals to achieve during treatment and discuss how the patient can incorporate goal setting into their daily lives to aide in recovery. Orientation:   The focus of this group is to educate the patient on the purpose and policies of crisis stabilization and provide a format to answer questions about their admission.  The group details unit policies and expectations of patients while admitted.    Participation Level:  Did Not Attend  Additional Comments:  Pt was aware that group was beginning but chose not to attend.  Adriana GORMAN Blush 07/14/2024, 9:39 AM

## 2024-07-14 NOTE — BH IP Treatment Plan (Signed)
 Interdisciplinary Treatment and Diagnostic Plan Update  07/14/2024 Time of Session: 11:25 am Samantha Hunter MRN: [id number]  Principal Diagnosis: MDD (major depressive disorder)  Secondary Diagnoses: Principal Problem:   MDD (major depressive disorder)   Current Medications:  Current Facility-Administered Medications  Medication Dose Route Frequency Provider Last Rate Last Admin   acetaminophen  (TYLENOL ) tablet 650 mg  650 mg Oral Q6H PRN Motley-Mangrum, Jadeka A, PMHNP       alum & mag hydroxide-simeth (MAALOX/MYLANTA) 200-200-20 MG/5ML suspension 30 mL  30 mL Oral Q4H PRN Motley-Mangrum, Jadeka A, PMHNP       haloperidol  (HALDOL ) tablet 5 mg  5 mg Oral TID PRN Motley-Mangrum, Jadeka A, PMHNP       And   diphenhydrAMINE  (BENADRYL ) capsule 50 mg  50 mg Oral TID PRN Motley-Mangrum, Jadeka A, PMHNP       haloperidol  lactate (HALDOL ) injection 5 mg  5 mg Intramuscular TID PRN Motley-Mangrum, Jadeka A, PMHNP       And   diphenhydrAMINE  (BENADRYL ) injection 50 mg  50 mg Intramuscular TID PRN Motley-Mangrum, Jadeka A, PMHNP       And   LORazepam  (ATIVAN ) injection 2 mg  2 mg Intramuscular TID PRN Motley-Mangrum, Jadeka A, PMHNP       haloperidol  lactate (HALDOL ) injection 10 mg  10 mg Intramuscular TID PRN Motley-Mangrum, Jadeka A, PMHNP       And   diphenhydrAMINE  (BENADRYL ) injection 50 mg  50 mg Intramuscular TID PRN Motley-Mangrum, Jadeka A, PMHNP       And   LORazepam  (ATIVAN ) injection 2 mg  2 mg Intramuscular TID PRN Motley-Mangrum, Jadeka A, PMHNP       hydrOXYzine  (ATARAX ) tablet 25 mg  25 mg Oral TID PRN Motley-Mangrum, Jadeka A, PMHNP   25 mg at 07/13/24 2107   magnesium  hydroxide (MILK OF MAGNESIA) suspension 30 mL  30 mL Oral Daily PRN Motley-Mangrum, Jadeka A, PMHNP       nicotine  (NICODERM CQ  - dosed in mg/24 hours) patch 14 mg  14 mg Transdermal Daily Pashayan, Alexander S, DO   14 mg at 07/14/24 0801   nicotine  polacrilex (NICORETTE ) gum 2 mg  2 mg Oral PRN Pashayan,  Alexander S, DO   2 mg at 07/14/24 1146   traZODone  (DESYREL ) tablet 50 mg  50 mg Oral QHS PRN Motley-Mangrum, Jadeka A, PMHNP       PTA Medications: Medications Prior to Admission  Medication Sig Dispense Refill Last Dose/Taking   albuterol  (PROVENTIL  HFA;VENTOLIN  HFA) 108 (90 Base) MCG/ACT inhaler Inhale 2 puffs into the lungs every 4 (four) hours as needed for wheezing or shortness of breath (cough, shortness of breath or wheezing.). 3 Inhaler 1    budesonide -formoterol  (SYMBICORT ) 160-4.5 MCG/ACT inhaler Inhale 2 puffs into the lungs 2 (two) times daily. (Patient not taking: Reported on 07/13/2024) 3 Inhaler 2     Patient Stressors: Marital or family conflict   Medication change or noncompliance    Patient Strengths: Forensic psychologist fund of knowledge  Physical Health  Supportive family/friends   Treatment Modalities: Medication Management, Group therapy, Case management,  1 to 1 session with clinician, Psychoeducation, Recreational therapy.   Physician Treatment Plan for Primary Diagnosis: MDD (major depressive disorder) Long Term Goal(s):     Short Term Goals:    Medication Management: Evaluate patient's response, side effects, and tolerance of medication regimen.  Therapeutic Interventions: 1 to 1 sessions, Unit Group sessions and Medication administration.  Evaluation of Outcomes: Not Progressing  Physician  Treatment Plan for Secondary Diagnosis: Principal Problem:   MDD (major depressive disorder)  Long Term Goal(s):     Short Term Goals:       Medication Management: Evaluate patient's response, side effects, and tolerance of medication regimen.  Therapeutic Interventions: 1 to 1 sessions, Unit Group sessions and Medication administration.  Evaluation of Outcomes: Not Progressing   RN Treatment Plan for Primary Diagnosis: MDD (major depressive disorder) Long Term Goal(s): Knowledge of disease and therapeutic regimen to maintain health will  improve  Short Term Goals: Ability to remain free from injury will improve, Ability to verbalize frustration and anger appropriately will improve, Ability to demonstrate self-control, Ability to participate in decision making will improve, Ability to verbalize feelings will improve, Ability to disclose and discuss suicidal ideas, Ability to identify and develop effective coping behaviors will improve, and Compliance with prescribed medications will improve  Medication Management: RN will administer medications as ordered by provider, will assess and evaluate patient's response and provide education to patient for prescribed medication. RN will report any adverse and/or side effects to prescribing provider.  Therapeutic Interventions: 1 on 1 counseling sessions, Psychoeducation, Medication administration, Evaluate responses to treatment, Monitor vital signs and CBGs as ordered, Perform/monitor CIWA, COWS, AIMS and Fall Risk screenings as ordered, Perform wound care treatments as ordered.  Evaluation of Outcomes: Not Progressing   LCSW Treatment Plan for Primary Diagnosis: MDD (major depressive disorder) Long Term Goal(s): Safe transition to appropriate next level of care at discharge, Engage patient in therapeutic group addressing interpersonal concerns.  Short Term Goals: Engage patient in aftercare planning with referrals and resources, Increase social support, Increase ability to appropriately verbalize feelings, Increase emotional regulation, Facilitate acceptance of mental health diagnosis and concerns, and Increase skills for wellness and recovery  Therapeutic Interventions: Assess for all discharge needs, 1 to 1 time with Social worker, Explore available resources and support systems, Assess for adequacy in community support network, Educate family and significant other(s) on suicide prevention, Complete Psychosocial Assessment, Interpersonal group therapy.  Evaluation of Outcomes: Not  Progressing   Progress in Treatment: Attending groups: Yes. Participating in groups: Yes. Taking medication as prescribed: Yes. Toleration medication: Yes. Family/Significant other contact made: No, will contact:  pt declined Patient understands diagnosis: Yes. Discussing patient identified problems/goals with staff: Yes. Medical problems stabilized or resolved: Yes. Denies suicidal/homicidal ideation: Yes. Issues/concerns per patient self-inventory: No. Other: none reported  New problem(s) identified: No, Describe:  none reported  New Short Term/Long Term Goal(s):  Patient Goals:   medication management  Discharge Plan or Barriers: Patient recently admitted. CSW will continue to follow and assess for appropriate referrals and possible discharge planning.    Reason for Continuation of Hospitalization: Anxiety Depression Suicidal ideation  Estimated Length of Stay: 5-7 days  Last 3 [place] Suicide Severity Risk Score: Flowsheet Row [facility] Most recent reading at 07/13/2024  9:00 PM ED from 07/13/2024 in [facility] Most recent reading at 07/13/2024  2:49 AM ED from 04/22/2024 in [facility] Most recent reading at 04/22/2024  1:05 PM  C-SSRS RISK CATEGORY High Risk High Risk No Risk    Last PHQ 2/9 Scores:    11/05/2023    6:25 PM 01/01/2019    3:13 PM 08/27/2018    4:22 PM  Depression screen PHQ 2/9  Decreased Interest 1 3 0  Down, Depressed, Hopeless 2 2 0  PHQ -  2 Score 3 5 0  Altered sleeping 2 3   Tired, decreased energy 2 3   Change in appetite 0 2   Feeling bad or failure about yourself  2 1   Trouble concentrating 1 3   Moving slowly or fidgety/restless 1 2   Suicidal thoughts 3 1   PHQ-9 Score 14 20   Difficult doing work/chores Very difficult      Scribe for Treatment Team: Benjaman Donia JONELLE ISRAEL 07/14/2024 1:01  PM
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
## 2024-07-14 NOTE — Progress Notes (Signed)
(  Sleep Hours) -7.25 (Any PRNs that were needed, meds refused, or side effects to meds)-Trazodone , Vistaril  (Any disturbances and when (visitation, over night)-none (Concerns raised by the patient)- None (SI/HI/AVH)-None

## 2024-07-15 DIAGNOSIS — F3181 Bipolar II disorder: Secondary | ICD-10-CM | POA: Diagnosis not present

## 2024-07-15 MED ORDER — LURASIDONE HCL 20 MG PO TABS
20.0000 mg | ORAL_TABLET | Freq: Every day | ORAL | Status: DC
  Administered 2024-07-15 – 2024-07-16 (×2): 20 mg via ORAL
  Filled 2024-07-15 (×2): qty 1

## 2024-07-15 MED ORDER — DOXEPIN HCL 10 MG PO CAPS
10.0000 mg | ORAL_CAPSULE | Freq: Every evening | ORAL | Status: DC | PRN
  Administered 2024-07-15 – 2024-07-17 (×3): 10 mg via ORAL
  Filled 2024-07-15 (×3): qty 1

## 2024-07-15 MED ORDER — MELATONIN 3 MG PO TABS
3.0000 mg | ORAL_TABLET | Freq: Every day | ORAL | Status: DC
  Administered 2024-07-15 – 2024-07-21 (×8): 3 mg via ORAL
  Filled 2024-07-15 (×7): qty 1

## 2024-07-15 NOTE — [facility] Suicide Risk Assessment (Signed)
 [facility] INPATIENT:  Family/Significant Other Suicide Prevention Education  Suicide Prevention Education:  Contact Attempts: [name] (mom) [phone], (name of family member/significant other) has been identified by the patient as the family member/significant other with whom the patient will be residing, and identified as the person(s) who will aid the patient in the event of a mental health crisis.    Date and time of first attempt: 07/15/2024 / 9:45 AM  CSW attempted to call mom in patient's presence but there was no response.   Samantha Hunter O Jun Osment, LCSWA 07/15/2024, 9:58 AM

## 2024-07-15 NOTE — Plan of Care (Signed)
  Problem: Education: Goal: Mental status will improve Outcome: Not Progressing   Problem: Activity: Goal: Interest or engagement in activities will improve Outcome: Not Progressing

## 2024-07-15 NOTE — Progress Notes (Addendum)
 Conversation with patient:  Patient said he lives with his parents and will return there upon discharge.  He said he tried to call his mom since he came to the hospital, but she doesn't respond (patient said mom has spam protection on her phone).   Patient said he has NA Sponsor, [employer], for over a year.  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   Diamonte Stavely, LCSWA 07/15/2024

## 2024-07-15 NOTE — Group Note (Signed)
 Recreation Therapy Group Note   Group Topic:Animal Assisted Therapy   Group Date: 07/15/2024 Start Time: 9049 End Time: 1028 Facilitators: Samantha Hunter, LRT,CTRS Location: 300 Hall Dayroom   Animal-Assisted Activity (AAA) Program Checklist/Progress Notes Patient Eligibility Criteria Checklist & Daily Group note for Rec Tx Intervention  AAA/T Program Assumption of Risk Form signed by Patient/ or Parent Legal Guardian Yes  Patient is free of allergies or severe asthma Yes  Patient reports no fear of animals Yes  Patient reports no history of cruelty to animals Yes  Patient understands his/her participation is voluntary Yes  Patient washes hands before animal contact Yes  Patient washes hands after animal contact Yes  Behavioral Response: Engaged   Education: Charity fundraiser, Appropriate Animal Interaction   Education Outcome: Acknowledges education.    Affect/Mood: Flat   Participation Level: Engaged   Participation Quality: Independent   Behavior: Appropriate   Speech/Thought Process: Focused   Insight: Good   Judgement: Good   Modes of Intervention: Teaching laboratory technician   Patient Response to Interventions:  Engaged   Education Outcome:  In group clarification offered    Clinical Observations/Individualized Feedback: Patient attended session and interacted appropriately with therapy dog and peers. Patient asked appropriate questions about therapy dog and his training. Patient shared stories about their pets at home with group.     Plan: Continue to engage patient in RT group sessions 2-3x/week.   Samantha Hunter, LRT,CTRS 07/15/2024 12:55 PM

## 2024-07-15 NOTE — Progress Notes (Signed)
   07/14/24 2105  Psych Admission Type (Psych Patients Only)  Admission Status Voluntary  Psychosocial Assessment  Patient Complaints Anxiety;Depression  Eye Contact Fair  Facial Expression Anxious;Flat  Affect Preoccupied;Depressed  Speech Soft  Interaction Guarded  Motor Activity Other (Comment) (WNL)  Appearance/Hygiene Improved  Behavior Characteristics Appropriate to situation;Guarded  Mood Anxious;Depressed  Thought Process  Coherency WDL  Content WDL  Delusions None reported or observed  Perception WDL  Hallucination None reported or observed  Judgment Poor  Confusion None  Danger to Self  Current suicidal ideation?  (Denies)  Agreement Not to Harm Self Yes  Description of Agreement Notify Staff  Danger to Others  Danger to Others None reported or observed

## 2024-07-15 NOTE — Plan of Care (Signed)

## 2024-07-15 NOTE — BH Assessment (Signed)
(  Sleep Hours) - 7.75 (Any PRNs that were needed, meds refused, or side effects to meds)- Hydroxyzine  25 mg (Any disturbances and when (visitation, over night)- None (Concerns raised by the patient)- Continues to have thoughts of SI/ Depression (SI/HI/AVH)- Passive/SI use a gun. Denies HI; A/V/H

## 2024-07-15 NOTE — Group Note (Signed)
 LCSW Group Therapy Note   Group Date: 07/15/2024 Start Time: 1100 End Time: 1200   Participation:  did not attend  Type of Therapy:  Group Therapy   Topic:  Money Matters: Ecologist, Confidence and Peace of Mind  Objective: To help participants understand the impact of financial stability on well-being through the lens of Maslow's Hierarchy of Needs and develop practical strategies for budgeting, saving, and debt repayment.  Goals: Increase awareness of spending habits and financial priorities, recognizing how money supports basic needs, security, and relationships. Develop simple budgeting and saving strategies to enhance stability and peace of mind.  Reduce financial stress by creating a realistic debt repayment plan, supporting long-term confidence and well-being.  Summary:  Participants explored how financial stability connects to basic needs, relationships, and self-esteem using Maslow's Hierarchy. They discussed budgeting, saving, and debt repayment strategies, identifying small, manageable changes. Through interactive discussion and self-reflection, they gained insight into their financial habits and created personal action steps for improvement.  Therapeutic Modalities Used: Elements of Cognitive Behavioral Therapy (CBT) - Addressing financial stress and thought patterns. Psychoeducation - Engineer, agricultural. Elements of Motivational Interviewing (MI) - Encouraging realistic, achievable changes. Group Support - Reducing shame and stress through shared experiences.   Kashus Karlen O Haila Dena, LCSWA 07/15/2024  12:46 PM

## 2024-07-15 NOTE — Progress Notes (Signed)
 [facility] MD Progress Note  07/15/2024 6:16 PM Samantha Hunter  MRN:  [id number] Subjective:  patient reports insomnia with trazodone  and hydroxyzine . Would like to try Doxepin  as Seroquel  he did not tolerate well. Patient reports SI and intrusive thoughts of self harm and cutting in context of coming off psychotropic medications. Continues to have those thoughts today but denies intent and contracts for safety. Agreeable to Latuda  trial. Patient appears depressed and anxious but calm and cooperative.  Principal Problem: MDD (major depressive disorder) Diagnosis: Active Problems:   Bipolar II disorder (HCC)  Total Time spent with patient: 30 minutes  Past Psychiatric History: see H&P  Past Medical History:  Past Medical History:  Diagnosis Date   Allergy    Anemia    Anxiety    Asthma    Cannabis use disorder, mild, abuse 03/16/2017   Depression    Gender dysphoria 05/12/2017   GERD (gastroesophageal reflux disease)    Non-suicidal self harm 03/16/2017   OCD (obsessive compulsive disorder) 03/16/2017   Suicidal ideation 03/16/2017    Past Surgical History:  Procedure Laterality Date   wisdom tooth removal Bilateral    Family History:  Family History  Problem Relation Age of Onset   Mental illness Mother    Alcohol abuse Mother    Drug abuse Mother    Depression Mother    Hyperlipidemia Mother    Mental illness Father    Alcohol abuse Father    Drug abuse Father    Depression Father    Cancer Father        skin   Heart disease Father    Hyperlipidemia Father    Stroke Father    Mental illness Brother    Anxiety disorder Brother    Depression Brother    Anxiety disorder Sister    Depression Sister    Mental illness Sister    Alcohol abuse Maternal Uncle    Drug abuse Maternal Uncle    Alcohol abuse Maternal Grandfather    Mental illness Brother    Mental illness Sister    Family Psychiatric  History: see H&P Social History:  Social History   Substance and Sexual Activity   Alcohol Use Not Currently     Social History   Substance and Sexual Activity  Drug Use Not Currently   Frequency: 14.0 times per week   Types: Marijuana, Cocaine   Comment: Coke, weed, and xanax    Social History   Socioeconomic History   Marital status: Single    Spouse name: Not on file   Number of children: Not on file   Years of education: Not on file   Highest education level: Not on file  Occupational History   Not on file  Tobacco Use   Smoking status: Every Day    Current packs/day: 0.25    Average packs/day: 0.3 packs/day for 6.6 years (1.6 ttl pk-yrs)    Types: Cigarettes    Start date: 02/08/2017    Last attempt to quit: 02/08/2018   Smokeless tobacco: Former    Types: Chew   Tobacco comments:    Declined cessation  Vaping Use   Vaping status: Every Day  Substance and Sexual Activity   Alcohol use: Not Currently   Drug use: Not Currently    Frequency: 14.0 times per week    Types: Marijuana, Cocaine    Comment: Coke, weed, and xanax   Sexual activity: Yes    Birth control/protection: Surgical, Other-see comments    Comment:  spouse has had vasectomy, pt amenorrheic on testosterone   Other Topics Concern   Not on file  Social History Narrative   Not on file   Social Drivers of Health   Financial Resource Strain: Not on file  Food Insecurity: No Food Insecurity (07/13/2024)   Hunger Vital Sign    Worried About Running Out of Food in the Last Year: Never true    Ran Out of Food in the Last Year: Never true  Transportation Needs: No Transportation Needs (07/13/2024)   PRAPARE - Administrator, Civil Service (Medical): No    Lack of Transportation (Non-Medical): No  Physical Activity: Not on file  Stress: Not on file  Social Connections: Not on file   Additional Social History:                         Sleep: Poor Estimated Sleeping Duration (Last 24 Hours): 7.00-7.50 hours  Appetite:  Fair  Current Medications: Current  Facility-Administered Medications  Medication Dose Route Frequency Provider Last Rate Last Admin   acetaminophen  (TYLENOL ) tablet 650 mg  650 mg Oral Q6H PRN Motley-Mangrum, Jadeka A, PMHNP       albuterol  (VENTOLIN  HFA) 108 (90 Base) MCG/ACT inhaler 2 puff  2 puff Inhalation Q4H PRN Jacory Kamel, MD       alum & mag hydroxide-simeth (MAALOX/MYLANTA) 200-200-20 MG/5ML suspension 30 mL  30 mL Oral Q4H PRN Motley-Mangrum, Jadeka A, PMHNP       buPROPion  (WELLBUTRIN  XL) 24 hr tablet 150 mg  150 mg Oral Daily Maevyn Riordan, MD   150 mg at 07/15/24 9260   haloperidol  (HALDOL ) tablet 5 mg  5 mg Oral TID PRN Motley-Mangrum, Jadeka A, PMHNP       And   diphenhydrAMINE  (BENADRYL ) capsule 50 mg  50 mg Oral TID PRN Motley-Mangrum, Jadeka A, PMHNP       haloperidol  lactate (HALDOL ) injection 5 mg  5 mg Intramuscular TID PRN Motley-Mangrum, Jadeka A, PMHNP       And   diphenhydrAMINE  (BENADRYL ) injection 50 mg  50 mg Intramuscular TID PRN Motley-Mangrum, Jadeka A, PMHNP       And   LORazepam  (ATIVAN ) injection 2 mg  2 mg Intramuscular TID PRN Motley-Mangrum, Jadeka A, PMHNP       haloperidol  lactate (HALDOL ) injection 10 mg  10 mg Intramuscular TID PRN Motley-Mangrum, Jadeka A, PMHNP       And   diphenhydrAMINE  (BENADRYL ) injection 50 mg  50 mg Intramuscular TID PRN Motley-Mangrum, Jadeka A, PMHNP       And   LORazepam  (ATIVAN ) injection 2 mg  2 mg Intramuscular TID PRN Motley-Mangrum, Jadeka A, PMHNP       divalproex  (DEPAKOTE  ER) 24 hr tablet 500 mg  500 mg Oral QHS Mcihael Hinderman, MD   500 mg at 07/14/24 2118   doxepin  (SINEQUAN ) capsule 10 mg  10 mg Oral QHS PRN Dilraj Killgore, MD       hydrOXYzine  (ATARAX ) tablet 25 mg  25 mg Oral TID PRN Motley-Mangrum, Jadeka A, PMHNP   25 mg at 07/15/24 1032   lurasidone  (LATUDA ) tablet 20 mg  20 mg Oral QPC supper Cinque Begley, MD   20 mg at 07/15/24 1815   magnesium  hydroxide (MILK OF MAGNESIA) suspension 30 mL  30 mL Oral Daily PRN Motley-Mangrum, Jadeka A,  PMHNP       melatonin tablet 3 mg  3 mg Oral QHS Reizel Calzada, MD  nicotine  (NICODERM CQ  - dosed in mg/24 hours) patch 21 mg  21 mg Transdermal Daily Horice Carrero, MD   21 mg at 07/15/24 9072   nicotine  polacrilex (NICORETTE ) gum 4 mg  4 mg Oral PRN Skyah Hannon, MD   4 mg at 07/15/24 1446    Lab Results:  Results for orders placed or performed during the hospital encounter of 07/13/24 (from the past 48 hours)  Rapid urine drug screen (hospital performed)     Status: Abnormal   Collection Time: 07/14/24  4:59 PM  Result Value Ref Range   Opiates NONE DETECTED NONE DETECTED   Cocaine NONE DETECTED NONE DETECTED   Benzodiazepines NONE DETECTED NONE DETECTED   Amphetamines NONE DETECTED NONE DETECTED   Tetrahydrocannabinol POSITIVE (A) NONE DETECTED   Barbiturates NONE DETECTED NONE DETECTED    Comment: (NOTE) DRUG SCREEN FOR MEDICAL PURPOSES ONLY.  IF CONFIRMATION IS NEEDED FOR ANY PURPOSE, NOTIFY LAB WITHIN 5 DAYS.  LOWEST DETECTABLE LIMITS FOR URINE DRUG SCREEN Drug Class                     Cutoff (ng/mL) Amphetamine and metabolites    1000 Barbiturate and metabolites    200 Benzodiazepine                 200 Opiates and metabolites        300 Cocaine and metabolites        300 THC                            50 Performed at [facility], [address], [place], [place] [address]     Blood Alcohol level:  Lab Results  Component Value Date   [employer] <15 07/13/2024   ETH <10 11/05/2023    Metabolic Disorder Labs: Lab Results  Component Value Date   HGBA1C 5.4 07/13/2024   MPG 108 07/13/2024   MPG 111.15 11/05/2023   Lab Results  Component Value Date   PROLACTIN 32.1 07/13/2024   Lab Results  Component Value Date   CHOL 151 07/13/2024   TRIG 59 07/13/2024   HDL 53 07/13/2024   CHOLHDL 2.8 07/13/2024   VLDL 12 07/13/2024   LDLCALC 86 07/13/2024   LDLCALC 126 (H) 11/05/2023    Physical Findings: AIMS:  ,  ,  ,  ,  ,  ,   CIWA:     COWS:     Musculoskeletal: Strength & Muscle Tone: within normal limits Gait & Station: normal Patient leans: N/A  Psychiatric Specialty Exam:  Presentation  General Appearance:  Appropriate for Environment  Eye Contact: Fair  Speech: Clear and Coherent  Speech Volume: Decreased  Handedness: Right   Mood and Affect  Mood: Anxious; Depressed  Affect: Blunt   Thought Process  Thought Processes: Coherent  Descriptions of Associations:Intact  Orientation:Full (Time, Place and Person)  Thought Content:Logical  History of Schizophrenia/Schizoaffective disorder:No  Duration of Psychotic Symptoms:N/A  Hallucinations:Hallucinations: None  Ideas of Reference:None  Suicidal Thoughts:Suicidal Thoughts: Yes, Active  Homicidal Thoughts:Homicidal Thoughts: No   Sensorium  Memory: Immediate Fair  Judgment: Fair  Insight: Fair   Art therapist  Concentration: Fair  Attention Span: Fair  Recall: [employer] of Knowledge: Fair  Language: Fair   Psychomotor Activity  Psychomotor Activity: Psychomotor Activity: Normal   Assets  Assets: Communication Skills; Desire for Improvement; Physical Health; Resilience; Social Support   Sleep  Sleep:  Sleep: Poor    Physical Exam: Physical Exam ROS Blood pressure 122/82, pulse 65, temperature 98 F (36.7 C), temperature source Oral, resp. rate 16, height 5' 5 (1.651 m), weight 129.5 kg, SpO2 100%. Body mass index is 47.51 kg/m.   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 States he has been manic in the past and that after extensive discussion with therapist and his psychiatrist they feels he has bipolar depression. Patient described symptoms consistent with bipolar II disorder as well as cluster B traits consistent with borderline  personality. However does described periods of not sleeping for days consistent with mania. Patient is not currently manic but is depressed and having worsening thoughts of cutting and suicide. HE has 2 prior suicide attempts and is at elevated acute risk for suicide.   I screened patient for borderline personality disorder as well and he resonated with mood lability, impulsivity, unstable sense of self and identity, unstable relationships with others, periods of cutting and transient periods of psychosis. I discussed patient may benefit from DBT on an outpatient basis to which he was agreeable.  Will restart Wellbutrin  and Depakote  that patient has previously found helpful. Patient states doses greater than 500 mg were too sedating but would be interested in starting adjuncts of Latuda  and Naltrexone  if these medications are tolerated well. Discussed r/b/a of all of below changes.  8/5: patient remains suicidal with thoughts of cutting himself and with depression. Agreeable to trial of Latuda  for bipolar depression as did not tolerated Seroquel  or Zyprexa  well previously and is concerned for weight gain as patient is obese. 2 prior suicide attempts and patient remains at high acute risk for suicide.   Treatment Plan Summary: Daily contact with patient to assess and evaluate symptoms and progress in treatment, Medication management, and Plan as below   Observation Level/Precautions:  15 minute checks  Laboratory:  CBC Chemistry Profile Folic Acid HbAIC UDS Vitamin B-12  Psychotherapy: engaged    Medications:  Wellbutrin  and Depakote   Consultations:    Discharge Concerns:  elevated acute risk for self harm and suicide  Estimated LOS: 5-7 days  Other:      Safety and Monitoring: voluntarily admission to inpatient psychiatric unit for safety, stabilization and treatment Daily contact with patient to assess and evaluate symptoms and progress in treatment Patient's case to be discussed in  multi-disciplinary team meeting Observation Level : q15 minute checks Vital signs: q12 hours Precautions: suicide, elopement, and assault   2. Psychiatric Problems   Bipolar II disorder -cont Depakote  ER 500 mg at bedtime -cont Wellbutrin  XL 150 mg qdaily - will consider adjunct of naltrexone  as patient's outpatient psychiatrist recommended for impulsivity -discussed r/b/a of starting Latuda  20 mg with dinner for bipolar depression and patient is agreeable    - hydroxyzine  25 mg TID- PRN for anxiety -stop trazodone  50 mg at bedtime-PRN for insomnia as pateitn reports it gives him nightmares and it is ineffective -start he did not tolerate Seroquel  well -start doxepin  10 mg at bedtime-PRN for insomnia   Stimulant use disorder (cocaine), in remission   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 Medical Management Covid negative CMP: neg CBC: unremarkable EtOH: <10 UDS: not completed I reored TSH:  wnl A1C: 5.4 Lipids: wnl BAL<15   #astham -albuterol  inhaler restarted PRN   Dispo;  patient remains suicidal, likely discharge in 5-7 days  Earle Burson, MD 07/15/2024, 6:16 PM

## 2024-07-15 NOTE — [facility] Suicide Risk Assessment (Signed)
 [facility] INPATIENT:  Family/Significant Other Suicide Prevention Education  Suicide Prevention Education:  Education Completed; Massie SQUIBB. (NA Sponsor) [phone],  (name of family member/significant other) has been identified by the patient as the family member/significant other with whom the patient will be residing, and identified as the person(s) who will aid the patient in the event of a mental health crisis (suicidal ideations/suicide attempt).    Patient allowed CSW to speak with his NA Sponsor in his presence.  Patient signed the ROI.    Sponsor said that he has patient's gun, patient will not have access to the gun at all after discharge.   Request made of family/significant other to: Remove weapons (e.g., guns, rifles, knives), all items previously/currently identified as safety concern.    The family member/significant other verbalizes understanding of the suicide prevention education information provided.  The family member/significant other agrees to remove the items of safety concern listed above.  Eryn Krejci O Jalil Lorusso, LCSWA 07/15/2024, 9:53 AM

## 2024-07-15 NOTE — [facility] Suicide Risk Assessment (Signed)
 [facility] INPATIENT:  Family/Significant Other Suicide Prevention Education  Suicide Prevention Education:  Education Completed; Mary Rallo (mom) [phone],  (name of family member/significant other) has been identified by the patient as the family member/significant other with whom the patient will be residing, and identified as the person(s) who will aid the patient in the event of a mental health crisis (suicidal ideations/suicide attempt).  With written consent from the patient, the family member/significant other has been provided the following suicide prevention education, prior to the and/or following the discharge of the patient.  Patient was present during the conversation with mom.  Mom said that patient can return home if patient agrees to certain conditions.  Patient said he will agree, and mom said he can return home.  Mom said she wanted to include her husband in this conversation so they agreed to speak on Wednesday, 07/16/2024 at 9 AM.  Patient agreed to it, and he will be present during the conversation.    Mom said they have guns in the home.  CSW asked to lock them up.  Mom confirmed that she will lock them up, and patient will not have access to them.  Mom will secure medications and sharp objects (such as knives and scissors).  Mom said she didn't have any concerns about patient being discharged.    The suicide prevention education provided includes the following: Suicide risk factors Suicide prevention and interventions National Suicide Hotline telephone number [facility] assessment telephone number [facility] Emergency Assistance 911 [facility] and/or Residential Mobile Crisis Unit telephone number  Request made of family/significant other to: Remove weapons (e.g., guns, rifles, knives), all items previously/currently identified as safety concern.   Remove drugs/medications (over-the-counter, prescriptions, illicit drugs), all items previously/currently  identified as a safety concern.  The family member/significant other verbalizes understanding of the suicide prevention education information provided.  The family member/significant other agrees to remove the items of safety concern listed above.  Rakisha Pincock O Cleva Camero, LCSWA 07/15/2024, 3:51 PM

## 2024-07-15 NOTE — Plan of Care (Signed)

## 2024-07-16 DIAGNOSIS — F3181 Bipolar II disorder: Secondary | ICD-10-CM | POA: Diagnosis not present

## 2024-07-16 MED ORDER — WHITE PETROLATUM EX OINT
TOPICAL_OINTMENT | CUTANEOUS | Status: AC
Start: 2024-07-16 — End: 2024-07-16
  Filled 2024-07-16: qty 5

## 2024-07-16 NOTE — Progress Notes (Signed)
 7a to 7p nurse shift overview note  Arvilla came to medication pass and assessment was completed by this RN.  Stated depression was a 7 on 10 scale and anxiety a 3 on 10 scale.  Reports she slept well, did not have trouble staying asleep.. Pt also reports that she thinks about cutting herself and she is coping by going back to room and sleeping.  Pt. Has used her nicotine  gum several times today. She also requested an atarax  to help with her increasing anxiety this afternoon.  Avelina Juneau RN

## 2024-07-16 NOTE — Group Note (Signed)
 Date:  07/16/2024 Time:  9:49 AM  Group Topic/Focus:  Goals Group:   The focus of this group is to help patients establish daily goals to achieve during treatment and discuss how the patient can incorporate goal setting into their daily lives to aide in recovery.    Participation Level:  Did Not Attend  Additional Comments:   Group was announced pt chose not to attend.  Adriana GORMAN Blush 07/16/2024, 9:49 AM

## 2024-07-16 NOTE — Progress Notes (Signed)
 [facility] MD Progress Note  07/16/2024 4:43 PM Kacie Huxtable  MRN:  [id number] Subjective:  Patient states he has been irritable all day. Reports continued depression and anxiety and reporting thoughts of self harm via cutting. Denies any intent to act on these thoughts. Patient reports SI without a plan. Patient reports he is upset with the social worker and is asking for a different Child psychotherapist. He has been isolating to room and did not attend groups today. I encouraged patient to participate in therapy. He reports feeling hopeless. Patient states he is upset because he felt that SW pressured him into calling his family and not his mother is saying he cannot go back to live with her.  Reports improved sleep last night with doxepin . Patient's outpatient psychiatrist had recommended he try naltrexone  both for substance cravings as well as impulsivity. Patient requesting trial which I think is appropriate. I discussed r/b/a. Contracts for safety.   Principal Problem: MDD (major depressive disorder) Diagnosis: Active Problems:   Bipolar II disorder (HCC)  Total Time spent with patient: 30 minutes  Past Psychiatric History: see H&P  Past Medical History:  Past Medical History:  Diagnosis Date   Allergy    Anemia    Anxiety    Asthma    Cannabis use disorder, mild, abuse 03/16/2017   Depression    Gender dysphoria 05/12/2017   GERD (gastroesophageal reflux disease)    Non-suicidal self harm 03/16/2017   OCD (obsessive compulsive disorder) 03/16/2017   Suicidal ideation 03/16/2017    Past Surgical History:  Procedure Laterality Date   wisdom tooth removal Bilateral    Family History:  Family History  Problem Relation Age of Onset   Mental illness Mother    Alcohol abuse Mother    Drug abuse Mother    Depression Mother    Hyperlipidemia Mother    Mental illness Father    Alcohol abuse Father    Drug abuse Father    Depression Father    Cancer Father        skin   Heart disease Father     Hyperlipidemia Father    Stroke Father    Mental illness Brother    Anxiety disorder Brother    Depression Brother    Anxiety disorder Sister    Depression Sister    Mental illness Sister    Alcohol abuse Maternal Uncle    Drug abuse Maternal Uncle    Alcohol abuse Maternal Grandfather    Mental illness Brother    Mental illness Sister    Family Psychiatric  History: see H&P Social History:  Social History   Substance and Sexual Activity  Alcohol Use Not Currently     Social History   Substance and Sexual Activity  Drug Use Not Currently   Frequency: 14.0 times per week   Types: Marijuana, Cocaine   Comment: Coke, weed, and xanax    Social History   Socioeconomic History   Marital status: Single    Spouse name: Not on file   Number of children: Not on file   Years of education: Not on file   Highest education level: Not on file  Occupational History   Not on file  Tobacco Use   Smoking status: Every Day    Current packs/day: 0.25    Average packs/day: 0.3 packs/day for 6.6 years (1.6 ttl pk-yrs)    Types: Cigarettes    Start date: 02/08/2017    Last attempt to quit: 02/08/2018   Smokeless  tobacco: Former    Types: Chew   Tobacco comments:    Declined cessation  Vaping Use   Vaping status: Every Day  Substance and Sexual Activity   Alcohol use: Not Currently   Drug use: Not Currently    Frequency: 14.0 times per week    Types: Marijuana, Cocaine    Comment: Coke, weed, and xanax   Sexual activity: Yes    Birth control/protection: Surgical, Other-see comments    Comment: spouse has had vasectomy, pt amenorrheic on testosterone   Other Topics Concern   Not on file  Social History Narrative   Not on file   Social Drivers of Health   Financial Resource Strain: Not on file  Food Insecurity: No Food Insecurity (07/13/2024)   Hunger Vital Sign    Worried About Running Out of Food in the Last Year: Never true    Ran Out of Food in the Last Year: Never true   Transportation Needs: No Transportation Needs (07/13/2024)   PRAPARE - Administrator, Civil Service (Medical): No    Lack of Transportation (Non-Medical): No  Physical Activity: Not on file  Stress: Not on file  Social Connections: Not on file   Additional Social History:                         Sleep: Poor Estimated Sleeping Duration (Last 24 Hours): 8.50-8.75 hours  Appetite:  Fair  Current Medications: Current Facility-Administered Medications  Medication Dose Route Frequency Provider Last Rate Last Admin   acetaminophen  (TYLENOL ) tablet 650 mg  650 mg Oral Q6H PRN Motley-Mangrum, Jadeka A, PMHNP       albuterol  (VENTOLIN  HFA) 108 (90 Base) MCG/ACT inhaler 2 puff  2 puff Inhalation Q4H PRN Marcile Fuquay, MD       alum & mag hydroxide-simeth (MAALOX/MYLANTA) 200-200-20 MG/5ML suspension 30 mL  30 mL Oral Q4H PRN Motley-Mangrum, Jadeka A, PMHNP       buPROPion  (WELLBUTRIN  XL) 24 hr tablet 150 mg  150 mg Oral Daily Valeska Haislip, MD   150 mg at 07/16/24 0825   haloperidol  (HALDOL ) tablet 5 mg  5 mg Oral TID PRN Motley-Mangrum, Jadeka A, PMHNP       And   diphenhydrAMINE  (BENADRYL ) capsule 50 mg  50 mg Oral TID PRN Motley-Mangrum, Jadeka A, PMHNP       haloperidol  lactate (HALDOL ) injection 5 mg  5 mg Intramuscular TID PRN Motley-Mangrum, Jadeka A, PMHNP       And   diphenhydrAMINE  (BENADRYL ) injection 50 mg  50 mg Intramuscular TID PRN Motley-Mangrum, Jadeka A, PMHNP       And   LORazepam  (ATIVAN ) injection 2 mg  2 mg Intramuscular TID PRN Motley-Mangrum, Jadeka A, PMHNP       haloperidol  lactate (HALDOL ) injection 10 mg  10 mg Intramuscular TID PRN Motley-Mangrum, Jadeka A, PMHNP       And   diphenhydrAMINE  (BENADRYL ) injection 50 mg  50 mg Intramuscular TID PRN Motley-Mangrum, Jadeka A, PMHNP       And   LORazepam  (ATIVAN ) injection 2 mg  2 mg Intramuscular TID PRN Motley-Mangrum, Jadeka A, PMHNP       divalproex  (DEPAKOTE  ER) 24 hr tablet 500 mg  500 mg  Oral QHS Hadlei Stitt, MD   500 mg at 07/15/24 2155   doxepin  (SINEQUAN ) capsule 10 mg  10 mg Oral QHS PRN Clide Remmers, MD   10 mg at 07/15/24 2146   hydrOXYzine  (  ATARAX ) tablet 25 mg  25 mg Oral TID PRN Motley-Mangrum, Jadeka A, PMHNP   25 mg at 07/16/24 1552   lurasidone  (LATUDA ) tablet 20 mg  20 mg Oral QPC supper Genesia Caslin, MD   20 mg at 07/15/24 1815   magnesium  hydroxide (MILK OF MAGNESIA) suspension 30 mL  30 mL Oral Daily PRN Motley-Mangrum, Jadeka A, PMHNP       melatonin tablet 3 mg  3 mg Oral QHS Marae Cottrell, MD   3 mg at 07/15/24 2146   nicotine  (NICODERM CQ  - dosed in mg/24 hours) patch 21 mg  21 mg Transdermal Daily Renatta Shrieves, MD   21 mg at 07/16/24 0825   nicotine  polacrilex (NICORETTE ) gum 4 mg  4 mg Oral PRN Vallerie Hentz, MD   4 mg at 07/16/24 1552    Lab Results:  Results for orders placed or performed during the hospital encounter of 07/13/24 (from the past 48 hours)  Rapid urine drug screen (hospital performed)     Status: Abnormal   Collection Time: 07/14/24  4:59 PM  Result Value Ref Range   Opiates NONE DETECTED NONE DETECTED   Cocaine NONE DETECTED NONE DETECTED   Benzodiazepines NONE DETECTED NONE DETECTED   Amphetamines NONE DETECTED NONE DETECTED   Tetrahydrocannabinol POSITIVE (A) NONE DETECTED   Barbiturates NONE DETECTED NONE DETECTED    Comment: (NOTE) DRUG SCREEN FOR MEDICAL PURPOSES ONLY.  IF CONFIRMATION IS NEEDED FOR ANY PURPOSE, NOTIFY LAB WITHIN 5 DAYS.  LOWEST DETECTABLE LIMITS FOR URINE DRUG SCREEN Drug Class                     Cutoff (ng/mL) Amphetamine and metabolites    1000 Barbiturate and metabolites    200 Benzodiazepine                 200 Opiates and metabolites        300 Cocaine and metabolites        300 THC                            50 Performed at [facility], [address], [place], [place] [address]     Blood Alcohol level:  Lab Results  Component Value Date   [facility] <15 07/13/2024    ETH <10 11/05/2023    Metabolic Disorder Labs: Lab Results  Component Value Date   HGBA1C 5.4 07/13/2024   MPG 108 07/13/2024   MPG 111.15 11/05/2023   Lab Results  Component Value Date   PROLACTIN 32.1 07/13/2024   Lab Results  Component Value Date   CHOL 151 07/13/2024   TRIG 59 07/13/2024   HDL 53 07/13/2024   CHOLHDL 2.8 07/13/2024   VLDL 12 07/13/2024   LDLCALC 86 07/13/2024   LDLCALC 126 (H) 11/05/2023    Physical Findings: AIMS:  ,  ,  ,  ,  ,  ,   CIWA:    COWS:     Musculoskeletal: Strength & Muscle Tone: within normal limits Gait & Station: normal Patient leans: N/A  Psychiatric Specialty Exam:  Presentation  General Appearance:  Appropriate for Environment  Eye Contact: Fair  Speech: Clear and Coherent  Speech Volume: Decreased  Handedness: Right   Mood and Affect  Mood: Anxious; Depressed  Affect: Blunt   Thought Process  Thought Processes: Coherent  Descriptions of Associations:Intact  Orientation:Full (Time, Place and Person)  Thought Content:Logical  History  of Schizophrenia/Schizoaffective disorder:No  Duration of Psychotic Symptoms:N/A  Hallucinations: denies today  Ideas of Reference:None  Suicidal Thoughts:SI, intrusive thoughts of cutting, denies intent   Homicidal Thoughts:denies   Sensorium  Memory: Immediate Fair  Judgment: Fair  Insight: Fair   Art therapist  Concentration: Fair  Attention Span: Fair  Recall: [employer] of Knowledge: Fair  Language: Fair   Psychomotor Activity  Psychomotor Activity: slowed   Assets  Assets: Manufacturing systems engineer; Desire for Improvement; Physical Health; Resilience; Social Support   Sleep  Sleep: fair    Physical Exam: Physical Exam ROS Blood pressure 115/79, pulse 71, temperature 98 F (36.7 C), temperature source Oral, resp. rate 16, height 5' 5 (1.651 m), weight 129.5 kg, SpO2 100%. Body mass index is 47.51  kg/m.   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 States he has been manic in the past and that after extensive discussion with therapist and his psychiatrist they feels he has bipolar depression. Patient described symptoms consistent with bipolar II disorder as well as cluster B traits consistent with borderline personality. However does described periods of not sleeping for days consistent with mania. Patient is not currently manic but is depressed and having worsening thoughts of cutting and suicide. HE has 2 prior suicide attempts and is at elevated acute risk for suicide.   I screened patient for borderline personality disorder as well and he resonated with mood lability, impulsivity, unstable sense of self and identity, unstable relationships with others, periods of cutting and transient periods of psychosis. I discussed patient may benefit from DBT on an outpatient basis to which he was agreeable.  Will restart Wellbutrin  and Depakote  that patient has previously found helpful. Patient states doses greater than 500 mg were too sedating but would be interested in starting adjuncts of Latuda  and Naltrexone  if these medications are tolerated well. Discussed r/b/a of all of below changes.  8/5: patient remains suicidal with thoughts of cutting himself and with depression. Agreeable to trial of Latuda  for bipolar depression as did not tolerated Seroquel  or Zyprexa  well previously and is concerned for weight gain as patient is obese. 2 prior suicide attempts and patient remains at high acute risk for suicide.  8/6: patient remains significantly depressed and suicidal. Intrusive thoughts of self harm but contracts for safety. Tolerating Latuda  for bipolar depression without side effects and will continue to titrate. Patient's outpatient psychiatrist had  recommended he try naltrexone  both for substance cravings as well as impulsivity. Patient requesting trial which I think is appropriate.    Treatment Plan Summary: Daily contact with patient to assess and evaluate symptoms and progress in treatment, Medication management, and Plan as below   Observation Level/Precautions:  15 minute checks  Laboratory:  CBC Chemistry Profile Folic Acid HbAIC UDS Vitamin B-12  Psychotherapy: engaged    Medications:  Wellbutrin  and Depakote   Consultations:    Discharge Concerns:  elevated acute risk for self harm and suicide  Estimated LOS: 5-7 days  Other:      Safety and Monitoring: voluntarily admission to inpatient psychiatric unit for safety, stabilization and treatment Daily contact with patient to assess and evaluate symptoms and progress in treatment Patient's case to be discussed in multi-disciplinary team meeting Observation Level : q15 minute checks Vital signs: q12 hours Precautions: suicide, elopement, and assault   2. Psychiatric Problems   Bipolar II disorder -cont Depakote   ER 500 mg at bedtime -cont Wellbutrin  XL 150 mg qdaily - cont naltrexone  50 mg qdaily as patient's outpatient psychiatrist recommended for impulsivity as well as substance abuse cravings in setting of BPD traits -discussed r/b/a of starting Latuda  20 mg with dinner for bipolar depression and patient is agreeable    - hydroxyzine  25 mg TID- PRN for anxiety -stop trazodone  50 mg at bedtime-PRN for insomnia as pateitn reports it gives him nightmares and it is ineffective -start he did not tolerate Seroquel  well -start doxepin  10 mg at bedtime-PRN for insomnia   Stimulant use disorder (cocaine), in remission   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 Medical Management Covid negative CMP: neg CBC: unremarkable EtOH: <10 UDS: not completed I reored TSH:  wnl A1C: 5.4 Lipids:  wnl BAL<15   #astham -albuterol  inhaler restarted PRN   Dispo; patient remains suicidal, likely discharge in 5-7 days  Gervase Colberg, MD 07/16/2024, 4:43 PM

## 2024-07-16 NOTE — Plan of Care (Signed)

## 2024-07-16 NOTE — Progress Notes (Signed)
   07/16/24 2009  Psych Admission Type (Psych Patients Only)  Admission Status Voluntary  Psychosocial Assessment  Patient Complaints Depression  Eye Contact Fair  Facial Expression Flat  Affect Preoccupied;Depressed  Speech Logical/coherent  Interaction Guarded  Motor Activity Slow  Appearance/Hygiene Unremarkable  Behavior Characteristics Appropriate to situation  Mood Depressed  Thought Process  Coherency WDL  Content WDL  Delusions None reported or observed  Perception WDL  Hallucination None reported or observed  Judgment Poor  Confusion None  Danger to Self  Current suicidal ideation? Passive  Description of Suicide Plan None  Self-Injurious Behavior No self-injurious ideation or behavior indicators observed or expressed   Agreement Not to Harm Self Yes  Description of Agreement verbal  Danger to Others  Danger to Others None reported or observed

## 2024-07-16 NOTE — Progress Notes (Signed)
(  Sleep Hours) -7,25 (Any PRNs that were needed, meds refused, or side effects to meds)- Vistaril , doxepin  (Any disturbances and when (visitation, over night)- none (Concerns raised by the patient)- want to go home, feels like meds are not working. (SI/HI/AVH)- Passive SI, no plan contracted for safety.

## 2024-07-16 NOTE — Progress Notes (Signed)
   07/15/24 2300  Psych Admission Type (Psych Patients Only)  Admission Status Voluntary  Psychosocial Assessment  Patient Complaints Anxiety;Depression  Eye Contact Fair  Facial Expression Flat  Affect Preoccupied;Depressed  Speech Logical/coherent  Interaction Guarded  Motor Activity Slow  Appearance/Hygiene Unremarkable  Behavior Characteristics Guarded  Mood Depressed  Thought Process  Coherency WDL  Content WDL  Delusions None reported or observed  Perception WDL  Hallucination None reported or observed  Judgment Poor  Confusion None  Danger to Self  Current suicidal ideation? Passive  Description of Suicide Plan none  Self-Injurious Behavior No self-injurious ideation or behavior indicators observed or expressed   Agreement Not to Harm Self Yes  Description of Agreement verbal  Danger to Others  Danger to Others None reported or observed

## 2024-07-16 NOTE — [facility] Suicide Risk Assessment (Addendum)
 [facility] INPATIENT:  Family/Significant Other Suicide Prevention Education  Suicide Prevention Education:  Education Completed; [name] (mom) [phone],  (name of family member/significant other) has been identified by the patient as the family member/significant other with whom the patient will be residing, and identified as the person(s) who will aid the patient in the event of a mental health crisis (suicidal ideations/suicide attempt).  With written consent from the patient, the family member/significant other has been provided the following suicide prevention education, prior to the and/or following the discharge of the patient.  Mom confirmed that the guns are locked up, and patient's wouldn't have access to them.  Dad asked to speak with the doctor to make sure that patient will not be a danger to himself when he is discharged.  Patient didn't give dad permission to speak with a doctor, so dad said that patient can't return to their home.  Patient hung up the phone.    CSW offered to call them again, but patient declined.  Request made of family/significant other to: Remove weapons (e.g., guns, rifles, knives), all items previously/currently identified as safety concern.     Samantha Hunter O Samantha Hunter, LCSWA 07/16/2024, 12:29 PM

## 2024-07-16 NOTE — Progress Notes (Cosign Needed)
 Adult Psychoeducational Group Note  Date:  07/16/2024 Time:  3:24 AM  Group Topic/Focus:  Wrap-Up Group:   The focus of this group is to help patients review their daily goal of treatment and discuss progress on daily workbooks.  Participation Level:  None  Participation Quality:  Inattentive  Affect:  Flat  Cognitive:  Lacking  Insight: Limited  Engagement in Group:  Limited  Modes of Intervention:  Limit-setting  Additional Comments:  Ray said she has nothing to share  Lang Drilling Long 07/16/2024, 3:24 AM

## 2024-07-17 MED ORDER — NALTREXONE HCL 50 MG PO TABS
50.0000 mg | ORAL_TABLET | Freq: Every day | ORAL | Status: DC
  Administered 2024-07-18 – 2024-07-22 (×7): 50 mg via ORAL
  Filled 2024-07-17 (×5): qty 1

## 2024-07-17 MED ORDER — LURASIDONE HCL 40 MG PO TABS
40.0000 mg | ORAL_TABLET | Freq: Every day | ORAL | Status: DC
  Administered 2024-07-17 – 2024-07-18 (×2): 40 mg via ORAL
  Filled 2024-07-17 (×2): qty 1

## 2024-07-17 MED ORDER — NALTREXONE HCL 50 MG PO TABS: 50.0000 mg | ORAL_TABLET | Freq: Every day | ORAL | Status: DC

## 2024-07-17 NOTE — Progress Notes (Signed)
(  Sleep Hours) - 10.75 (Any PRNs that were needed, meds refused, or side effects to meds)- Doxepin , (Any disturbances and when (visitation, over night)- none (Concerns raised by the patient)- none (SI/HI/AVH)- Passive SI, contracted for safety.

## 2024-07-17 NOTE — Progress Notes (Signed)
 [facility] MD Progress Note  07/17/2024 1:57 PM Samantha Hunter  MRN:  [id number]   Subjective:    Reports continued depression and anxiety and reporting thoughts of self harm via cutting, patient tolerating all medications well and denies side effects. Denies any intent to act on these thoughts. Patient reports SI without a plan. Patient is interested in increasing Latuda  dose. Patient notes feeling hopeless and has been isolating to room. I encouraged him to attend groups.   Reports improved sleep with doxepin  but having some difficulty last night due to a new roomated. Patient's outpatient psychiatrist had recommended he try naltrexone  both for substance cravings as well as impulsivity and will also start tomorrow morning. I discussed r/b/a. Contracts for safety.   Principal Problem: MDD (major depressive disorder) Diagnosis: Active Problems:   Bipolar II disorder (HCC)  Total Time spent with patient: 30 minutes  Past Psychiatric History: see H&P  Past Medical History:  Past Medical History:  Diagnosis Date   Allergy    Anemia    Anxiety    Asthma    Cannabis use disorder, mild, abuse 03/16/2017   Depression    Gender dysphoria 05/12/2017   GERD (gastroesophageal reflux disease)    Non-suicidal self harm 03/16/2017   OCD (obsessive compulsive disorder) 03/16/2017   Suicidal ideation 03/16/2017    Past Surgical History:  Procedure Laterality Date   wisdom tooth removal Bilateral    Family History:  Family History  Problem Relation Age of Onset   Mental illness Mother    Alcohol abuse Mother    Drug abuse Mother    Depression Mother    Hyperlipidemia Mother    Mental illness Father    Alcohol abuse Father    Drug abuse Father    Depression Father    Cancer Father        skin   Heart disease Father    Hyperlipidemia Father    Stroke Father    Mental illness Brother    Anxiety disorder Brother    Depression Brother    Anxiety disorder Sister    Depression Sister    Mental  illness Sister    Alcohol abuse Maternal Uncle    Drug abuse Maternal Uncle    Alcohol abuse Maternal Grandfather    Mental illness Brother    Mental illness Sister    Family Psychiatric  History: see H&P Social History:  Social History   Substance and Sexual Activity  Alcohol Use Not Currently     Social History   Substance and Sexual Activity  Drug Use Not Currently   Frequency: 14.0 times per week   Types: Marijuana, Cocaine   Comment: Coke, weed, and xanax    Social History   Socioeconomic History   Marital status: Single    Spouse name: Not on file   Number of children: Not on file   Years of education: Not on file   Highest education level: Not on file  Occupational History   Not on file  Tobacco Use   Smoking status: Every Day    Current packs/day: 0.25    Average packs/day: 0.3 packs/day for 6.6 years (1.6 ttl pk-yrs)    Types: Cigarettes    Start date: 02/08/2017    Last attempt to quit: 02/08/2018   Smokeless tobacco: Former    Types: Chew   Tobacco comments:    Declined cessation  Vaping Use   Vaping status: Every Day  Substance and Sexual Activity   Alcohol use:  Not Currently   Drug use: Not Currently    Frequency: 14.0 times per week    Types: Marijuana, Cocaine    Comment: Coke, weed, and xanax   Sexual activity: Yes    Birth control/protection: Surgical, Other-see comments    Comment: spouse has had vasectomy, pt amenorrheic on testosterone   Other Topics Concern   Not on file  Social History Narrative   Not on file   Social Drivers of Health   Financial Resource Strain: Not on file  Food Insecurity: No Food Insecurity (07/13/2024)   Hunger Vital Sign    Worried About Running Out of Food in the Last Year: Never true    Ran Out of Food in the Last Year: Never true  Transportation Needs: No Transportation Needs (07/13/2024)   PRAPARE - Administrator, Civil Service (Medical): No    Lack of Transportation (Non-Medical): No   Physical Activity: Not on file  Stress: Not on file  Social Connections: Not on file   Additional Social History:                         Sleep: Poor Estimated Sleeping Duration (Last 24 Hours): 8.75 hours  Appetite:  Fair  Current Medications: Current Facility-Administered Medications  Medication Dose Route Frequency Provider Last Rate Last Admin   acetaminophen  (TYLENOL ) tablet 650 mg  650 mg Oral Q6H PRN Motley-Mangrum, Jadeka A, PMHNP       albuterol  (VENTOLIN  HFA) 108 (90 Base) MCG/ACT inhaler 2 puff  2 puff Inhalation Q4H PRN Sanye Ledesma, MD       alum & mag hydroxide-simeth (MAALOX/MYLANTA) 200-200-20 MG/5ML suspension 30 mL  30 mL Oral Q4H PRN Motley-Mangrum, Jadeka A, PMHNP       buPROPion  (WELLBUTRIN  XL) 24 hr tablet 150 mg  150 mg Oral Daily Jelani Trueba, MD   150 mg at 07/17/24 0753   haloperidol  (HALDOL ) tablet 5 mg  5 mg Oral TID PRN Motley-Mangrum, Jadeka A, PMHNP       And   diphenhydrAMINE  (BENADRYL ) capsule 50 mg  50 mg Oral TID PRN Motley-Mangrum, Jadeka A, PMHNP       haloperidol  lactate (HALDOL ) injection 5 mg  5 mg Intramuscular TID PRN Motley-Mangrum, Jadeka A, PMHNP       And   diphenhydrAMINE  (BENADRYL ) injection 50 mg  50 mg Intramuscular TID PRN Motley-Mangrum, Jadeka A, PMHNP       And   LORazepam  (ATIVAN ) injection 2 mg  2 mg Intramuscular TID PRN Motley-Mangrum, Jadeka A, PMHNP       haloperidol  lactate (HALDOL ) injection 10 mg  10 mg Intramuscular TID PRN Motley-Mangrum, Jadeka A, PMHNP       And   diphenhydrAMINE  (BENADRYL ) injection 50 mg  50 mg Intramuscular TID PRN Motley-Mangrum, Jadeka A, PMHNP       And   LORazepam  (ATIVAN ) injection 2 mg  2 mg Intramuscular TID PRN Motley-Mangrum, Jadeka A, PMHNP       divalproex  (DEPAKOTE  ER) 24 hr tablet 500 mg  500 mg Oral QHS Jabez Molner, MD   500 mg at 07/16/24 2157   doxepin  (SINEQUAN ) capsule 10 mg  10 mg Oral QHS PRN Olukemi Panchal, MD   10 mg at 07/16/24 2157   hydrOXYzine  (ATARAX )  tablet 25 mg  25 mg Oral TID PRN Motley-Mangrum, Jadeka A, PMHNP   25 mg at 07/16/24 1552   lurasidone  (LATUDA ) tablet 40 mg  40 mg Oral QPC supper  Prateek Knipple, MD       magnesium  hydroxide (MILK OF MAGNESIA) suspension 30 mL  30 mL Oral Daily PRN Motley-Mangrum, Jadeka A, PMHNP       melatonin tablet 3 mg  3 mg Oral QHS Valissa Lyvers, MD   3 mg at 07/16/24 2157   [START ON 07/18/2024] naltrexone  (DEPADE) tablet 50 mg  50 mg Oral Daily Tiyonna Sardinha, MD       nicotine  (NICODERM CQ  - dosed in mg/24 hours) patch 21 mg  21 mg Transdermal Daily Lilianne Delair, MD   21 mg at 07/17/24 0754   nicotine  polacrilex (NICORETTE ) gum 4 mg  4 mg Oral PRN Rayli Wiederhold, MD   4 mg at 07/17/24 1254    Lab Results:  No results found for this or any previous visit (from the past 48 hours).   Blood Alcohol level:  Lab Results  Component Value Date   [facility] <15 07/13/2024   ETH <10 11/05/2023    Metabolic Disorder Labs: Lab Results  Component Value Date   HGBA1C 5.4 07/13/2024   MPG 108 07/13/2024   MPG 111.15 11/05/2023   Lab Results  Component Value Date   PROLACTIN 32.1 07/13/2024   Lab Results  Component Value Date   CHOL 151 07/13/2024   TRIG 59 07/13/2024   HDL 53 07/13/2024   CHOLHDL 2.8 07/13/2024   VLDL 12 07/13/2024   LDLCALC 86 07/13/2024   LDLCALC 126 (H) 11/05/2023    Physical Findings: AIMS:  ,  ,  ,  ,  ,  ,   CIWA:    COWS:     Musculoskeletal: Strength & Muscle Tone: within normal limits Gait & Station: normal Patient leans: N/A  Psychiatric Specialty Exam:  Presentation  General Appearance:  Appropriate for Environment  Eye Contact: Fair  Speech: Clear and Coherent  Speech Volume: Decreased  Handedness: Right   Mood and Affect  Mood: Anxious; Depressed  Affect: Blunt   Thought Process  Thought Processes: Coherent  Descriptions of Associations:Intact  Orientation:Full (Time, Place and Person)  Thought Content:Logical  History of  Schizophrenia/Schizoaffective disorder:No  Duration of Psychotic Symptoms:N/A  Hallucinations: denies today  Ideas of Reference:None  Suicidal Thoughts:SI, intrusive thoughts of cutting, denies intent   Homicidal Thoughts:denies   Sensorium  Memory: Immediate Fair  Judgment: Fair  Insight: Fair   Art therapist  Concentration: Fair  Attention Span: Fair  Recall: [employer] of Knowledge: Fair  Language: Fair   Psychomotor Activity  Psychomotor Activity: slowed   Assets  Assets: Manufacturing systems engineer; Desire for Improvement; Physical Health; Resilience; Social Support   Sleep  Sleep: fair    Physical Exam: Physical Exam ROS Blood pressure 109/86, pulse 78, temperature 98 F (36.7 C), temperature source Oral, resp. rate 16, height 5' 5 (1.651 m), weight 129.5 kg, SpO2 100%. Body mass index is 47.51 kg/m.   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 States he has been manic in the past and that after extensive discussion with therapist and his psychiatrist they feels he has bipolar depression. Patient described symptoms consistent with bipolar II disorder as well as cluster B traits consistent with borderline personality. However does described periods of not sleeping for days consistent with mania. Patient is not currently manic but is depressed and having worsening thoughts of cutting and suicide. HE has 2 prior suicide attempts and is  at elevated acute risk for suicide.   I screened patient for borderline personality disorder as well and he resonated with mood lability, impulsivity, unstable sense of self and identity, unstable relationships with others, periods of cutting and transient periods of psychosis. I discussed patient may benefit from DBT on an outpatient basis to which he was agreeable.   Will restart Wellbutrin  and Depakote  that patient has previously found helpful. Patient states doses greater than 500 mg were too sedating but would be interested in starting adjuncts of Latuda  and Naltrexone  if these medications are tolerated well. Discussed r/b/a of all of below changes.  8/5: patient remains suicidal with thoughts of cutting himself and with depression. Agreeable to trial of Latuda  for bipolar depression as did not tolerated Seroquel  or Zyprexa  well previously and is concerned for weight gain as patient is obese. 2 prior suicide attempts and patient remains at high acute risk for suicide.  8/6: patient remains significantly depressed and suicidal. Intrusive thoughts of self harm but contracts for safety. Tolerating Latuda  for bipolar depression without side effects and will continue to titrate. Patient's outpatient psychiatrist had recommended he try naltrexone  both for substance cravings as well as impulsivity. Patient requesting trial which I think is appropriate.   8/7: patient remains significantly depressed and suicidal. Intrusive thoughts of self harm via cutting but contracts for safety. Agreeable to titration of Latuda  and starting naltrexone  as recommended by outpatient psychiatrist.    Treatment Plan Summary: Daily contact with patient to assess and evaluate symptoms and progress in treatment, Medication management, and Plan as below   Observation Level/Precautions:  15 minute checks  Laboratory:  CBC Chemistry Profile Folic Acid HbAIC UDS Vitamin B-12  Psychotherapy: engaged    Medications:  Wellbutrin  and Depakote   Consultations:    Discharge Concerns:  elevated acute risk for self harm and suicide  Estimated LOS: 5-7 days  Other:      Safety and Monitoring: voluntarily admission to inpatient psychiatric unit for safety, stabilization and treatment Daily contact with patient to assess and evaluate symptoms and progress in treatment Patient's case to be  discussed in multi-disciplinary team meeting Observation Level : q15 minute checks Vital signs: q12 hours Precautions: suicide, elopement, and assault   2. Psychiatric Problems   Bipolar II disorder -cont Depakote  ER 500 mg at bedtime -cont Wellbutrin  XL 150 mg qdaily -start  naltrexone  50 mg qdaily tomorrow AM as patient's outpatient psychiatrist recommended for impulsivity as well as substance abuse cravings in setting of BPD traits -discussed r/b/a of increase Latuda  to 40 mg with dinner for bipolar depression and patient is agreeable    - hydroxyzine  25 mg TID- PRN for anxiety -stop trazodone  50 mg at bedtime-PRN for insomnia as pateitn reports it gives him nightmares and it is ineffective -start he did not tolerate Seroquel  well -start doxepin  10 mg at bedtime-PRN for insomnia   Stimulant use disorder (cocaine), in remission   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 Medical Management Covid negative CMP: neg CBC: unremarkable EtOH: <10 UDS: not completed I reored TSH:  wnl A1C: 5.4 Lipids: wnl BAL<15   #astham -albuterol  inhaler restarted PRN   Dispo; patient remains suicidal, likely discharge in 5-7 days  Jemina Scahill, MD 07/17/2024, 1:57 PM

## 2024-07-17 NOTE — Group Note (Signed)
 Occupational Therapy Group Note  Group Topic:Coping Skills  Group Date: 07/17/2024 Start Time: 1430 End Time: 1500 Facilitators: Dot [place] MATSU, OT   Group Description: Group encouraged increased engagement and participation through discussion and activity focused on Coping Ahead. Patients were split up into teams and selected a card from a stack of positive coping strategies. Patients were instructed to act out/charade the coping skill for other peers to guess and receive points for their team. Discussion followed with a focus on identifying additional positive coping strategies and patients shared how they were going to cope ahead over the weekend while continuing hospitalization stay.  Therapeutic Goal(s): Identify positive vs negative coping strategies. Identify coping skills to be used during hospitalization vs coping skills outside of hospital/at home Increase participation in therapeutic group environment and promote engagement in treatment   Participation Level: Engaged   Participation Quality: Independent   Behavior: Appropriate   Speech/Thought Process: Relevant   Affect/Mood: Appropriate   Insight: Fair   Judgement: Fair      Modes of Intervention: Education  Patient Response to Interventions:  Attentive   Plan: Continue to engage patient in OT groups 2 - 3x/week.  07/17/2024  [place] MATSU Dot, OT Mikeal Winstanley, OT

## 2024-07-17 NOTE — [facility] Group Notes (Signed)
 [facility] Group Notes:  (Nursing/MHT/Case Management/Adjunct)  Date:  07/17/2024  Time:  9:52 PM  Type of Therapy:  Wrap-up group  Participation Level:  Active  Participation Quality:  Appropriate  Affect:  Appropriate  Cognitive:  Appropriate  Insight:  Appropriate  Engagement in Group:  Engaged  Modes of Intervention:  Education  Summary of Progress/Problems: Pt goal to not sleep all day. Met his goal. Rated day 4/10.  [name] 07/17/2024, 9:52 PM

## 2024-07-17 NOTE — Group Note (Signed)
 LCSW Group Therapy Note   Group Date: 07/17/2024 Start Time: 1100 End Time: 1200   Participation:  did not attend  Type of Therapy:  Group Therapy  Topic:  Finding Balance: Using Wise Mind for Thoughtful Decisions  Objective:  To help participants understand and apply the concept of Delsie Mind to make balanced, thoughtful decisions by integrating emotion and logic.  Goals: Learn the differences between Emotional Mind, Reasonable Mind, and [employer]. Recognize personal signs of Emotional and Reasonable Mind. Practice using [employer] in real-life scenarios.  Therapeutic Modalities: Elements of Dialectical Behavior Therapy (DBT):  Mindfulness (noticing thoughts and emotions without judgment), Emotion Regulation (understanding and managing emotional responses), Distress Tolerance (coping with difficult situations without making them worse), Wise Mind (integrating emotion and reason for balanced decision-making) Elements of Cognitive Behavioral Therapy (CBT):  Identifying automatic thoughts, Challenging cognitive distortions, Using logic to reframe unhelpful thinking patterns  Summary:  This class focused on Wise Mind - DBT's concept of balancing Emotional Mind and Reasonable Mind. We identified when we're in each state and practiced using Wise Mind to respond thoughtfully in real-life situations. By combining emotion and logic, participants can improve decision-making, manage challenges, and enhance relationships.   Retal Tonkinson O Alleah Dearman, LCSWA 07/17/2024  12:20 PM

## 2024-07-17 NOTE — Progress Notes (Signed)
   07/17/24 0910  Psych Admission Type (Psych Patients Only)  Admission Status Voluntary  Psychosocial Assessment  Patient Complaints Anxiety;Depression  Eye Contact Fair  Facial Expression Flat  Affect Depressed;Anxious  Speech Logical/coherent  Interaction Guarded  Motor Activity Slow  Appearance/Hygiene Unremarkable  Behavior Characteristics Appropriate to situation  Mood Depressed  Thought Process  Coherency WDL  Content WDL  Delusions None reported or observed  Perception WDL  Hallucination None reported or observed  Judgment Poor  Confusion None  Danger to Self  Current suicidal ideation? Passive  Agreement Not to Harm Self Yes  Description of Agreement Verbal  Danger to Others  Danger to Others None reported or observed

## 2024-07-17 NOTE — Plan of Care (Signed)
 ?  Problem: Activity: ?Goal: Interest or engagement in activities will improve ?Outcome: Progressing ?Goal: Sleeping patterns will improve ?Outcome: Progressing ?  ?Problem: Coping: ?Goal: Ability to verbalize frustrations and anger appropriately will improve ?Outcome: Progressing ?Goal: Ability to demonstrate self-control will improve ?Outcome: Progressing ?  ?Problem: Safety: ?Goal: Periods of time without injury will increase ?Outcome: Progressing ?  ?

## 2024-07-18 ENCOUNTER — Encounter (HOSPITAL_COMMUNITY): Payer: Self-pay

## 2024-07-18 MED ORDER — MIRTAZAPINE 7.5 MG PO TABS
7.5000 mg | ORAL_TABLET | Freq: Every day | ORAL | Status: DC
  Administered 2024-07-18 – 2024-07-21 (×5): 7.5 mg via ORAL
  Filled 2024-07-18 (×4): qty 1

## 2024-07-18 MED ORDER — TESTOSTERONE CYPIONATE 200 MG/ML IM SOLN
100.0000 mg | INTRAMUSCULAR | Status: DC
  Administered 2024-07-18: 100 mg via INTRAMUSCULAR
  Filled 2024-07-18: qty 1

## 2024-07-18 NOTE — Plan of Care (Signed)
   Problem: Education: Goal: Knowledge of [facility] Education information/materials will improve Outcome: Progressing Goal: Verbalization of understanding the information provided will improve Outcome: Progressing   Problem: Activity: Goal: Interest or engagement in activities will improve Outcome: Progressing

## 2024-07-18 NOTE — Group Note (Signed)
 Date:  07/18/2024 Time:  9:29 PM  Group Topic/Focus:  Wrap-Up Group:   The focus of this group is to help patients review their daily goal of treatment and discuss progress on daily workbooks.    Additional Comments:   Pt was encouraged, but opted out of attending wrap up group.  Samantha Hunter 07/18/2024, 9:29 PM

## 2024-07-18 NOTE — Progress Notes (Signed)
   07/18/24 1300  Psychosocial Assessment  Patient Complaints Anxiety;Depression  Eye Contact Fair  Facial Expression Flat  Affect Blunted  Speech Logical/coherent  Interaction Assertive  Motor Activity Other (Comment) (WDL)  Appearance/Hygiene Unremarkable  Behavior Characteristics Appropriate to situation  Mood Anxious;Depressed  Thought Process  Coherency WDL  Content WDL  Delusions None reported or observed  Perception WDL  Hallucination None reported or observed  Judgment WDL  Confusion None  Danger to Self  Current suicidal ideation? Passive (contracts for safety)

## 2024-07-18 NOTE — Group Note (Signed)
 Date:  07/18/2024 Time:  4:20 PM  Group Topic/Focus:  Healthy Communication:   The focus of this group is to discuss communication, barriers to communication, as well as healthy ways to communicate with others. We discussed healthy personal boundaries.    Participation Level:  Active  Participation Quality:  Appropriate, Attentive, and Sharing  Affect:  Appropriate  Cognitive:  Alert, Appropriate, and Oriented  Insight: Good and Improving  Engagement in Group:  Engaged and Improving  Modes of Intervention:  Discussion   Powell JAYSON Sharps 07/18/2024, 4:20 PM

## 2024-07-18 NOTE — Plan of Care (Signed)

## 2024-07-18 NOTE — BH IP Treatment Plan (Signed)
 Interdisciplinary Treatment and Diagnostic Plan Update  07/18/2024 Time of Session: 8:15 AM - UPDATE Samantha Hunter MRN: [id number]  Principal Diagnosis: MDD (major depressive disorder)  Secondary Diagnoses: Active Problems:   Bipolar II disorder (HCC)   Current Medications:  Current Facility-Administered Medications  Medication Dose Route Frequency Provider Last Rate Last Admin   acetaminophen  (TYLENOL ) tablet 650 mg  650 mg Oral Q6H PRN Motley-Mangrum, Jadeka A, PMHNP       albuterol  (VENTOLIN  HFA) 108 (90 Base) MCG/ACT inhaler 2 puff  2 puff Inhalation Q4H PRN Zouev, Dmitri, MD       alum & mag hydroxide-simeth (MAALOX/MYLANTA) 200-200-20 MG/5ML suspension 30 mL  30 mL Oral Q4H PRN Motley-Mangrum, Jadeka A, PMHNP       buPROPion  (WELLBUTRIN  XL) 24 hr tablet 150 mg  150 mg Oral Daily Zouev, Dmitri, MD   150 mg at 07/17/24 0753   haloperidol  (HALDOL ) tablet 5 mg  5 mg Oral TID PRN Motley-Mangrum, Jadeka A, PMHNP       And   diphenhydrAMINE  (BENADRYL ) capsule 50 mg  50 mg Oral TID PRN Motley-Mangrum, Jadeka A, PMHNP       haloperidol  lactate (HALDOL ) injection 5 mg  5 mg Intramuscular TID PRN Motley-Mangrum, Jadeka A, PMHNP       And   diphenhydrAMINE  (BENADRYL ) injection 50 mg  50 mg Intramuscular TID PRN Motley-Mangrum, Jadeka A, PMHNP       And   LORazepam  (ATIVAN ) injection 2 mg  2 mg Intramuscular TID PRN Motley-Mangrum, Jadeka A, PMHNP       haloperidol  lactate (HALDOL ) injection 10 mg  10 mg Intramuscular TID PRN Motley-Mangrum, Jadeka A, PMHNP       And   diphenhydrAMINE  (BENADRYL ) injection 50 mg  50 mg Intramuscular TID PRN Motley-Mangrum, Jadeka A, PMHNP       And   LORazepam  (ATIVAN ) injection 2 mg  2 mg Intramuscular TID PRN Motley-Mangrum, Jadeka A, PMHNP       divalproex  (DEPAKOTE  ER) 24 hr tablet 500 mg  500 mg Oral QHS Zouev, Dmitri, MD   500 mg at 07/17/24 2145   doxepin  (SINEQUAN ) capsule 10 mg  10 mg Oral QHS PRN Zouev, Dmitri, MD   10 mg at 07/17/24 2145    hydrOXYzine  (ATARAX ) tablet 25 mg  25 mg Oral TID PRN Motley-Mangrum, Jadeka A, PMHNP   25 mg at 07/17/24 2147   lurasidone  (LATUDA ) tablet 40 mg  40 mg Oral QPC supper Zouev, Dmitri, MD   40 mg at 07/17/24 1809   magnesium  hydroxide (MILK OF MAGNESIA) suspension 30 mL  30 mL Oral Daily PRN Motley-Mangrum, Jadeka A, PMHNP       melatonin tablet 3 mg  3 mg Oral QHS Zouev, Dmitri, MD   3 mg at 07/17/24 2145   naltrexone  (DEPADE) tablet 50 mg  50 mg Oral Daily Zouev, Dmitri, MD       nicotine  (NICODERM CQ  - dosed in mg/24 hours) patch 21 mg  21 mg Transdermal Daily Zouev, Dmitri, MD   21 mg at 07/17/24 0754   nicotine  polacrilex (NICORETTE ) gum 4 mg  4 mg Oral PRN Zouev, Dmitri, MD   4 mg at 07/18/24 0617   PTA Medications: Medications Prior to Admission  Medication Sig Dispense Refill Last Dose/Taking   albuterol  (PROVENTIL  HFA;VENTOLIN  HFA) 108 (90 Base) MCG/ACT inhaler Inhale 2 puffs into the lungs every 4 (four) hours as needed for wheezing or shortness of breath (cough, shortness of breath or wheezing.). 3 Inhaler  1    budesonide -formoterol  (SYMBICORT ) 160-4.5 MCG/ACT inhaler Inhale 2 puffs into the lungs 2 (two) times daily. (Patient not taking: Reported on 07/13/2024) 3 Inhaler 2     Patient Stressors: Marital or family conflict   Medication change or noncompliance    Patient Strengths: Forensic psychologist fund of knowledge  Physical Health  Supportive family/friends   Treatment Modalities: Medication Management, Group therapy, Case management,  1 to 1 session with clinician, Psychoeducation, Recreational therapy.   Physician Treatment Plan for Primary Diagnosis: MDD (major depressive disorder) Long Term Goal(s): Improvement in symptoms so as ready for discharge   Short Term Goals: Ability to identify changes in lifestyle to reduce recurrence of condition will improve Ability to verbalize feelings will improve Ability to disclose and discuss suicidal ideas Ability to  demonstrate self-control will improve Ability to identify and develop effective coping behaviors will improve Ability to maintain clinical measurements within normal limits will improve Compliance with prescribed medications will improve Ability to identify triggers associated with substance abuse/mental health issues will improve  Medication Management: Evaluate patient's response, side effects, and tolerance of medication regimen.  Therapeutic Interventions: 1 to 1 sessions, Unit Group sessions and Medication administration.  Evaluation of Outcomes: Progressing  Physician Treatment Plan for Secondary Diagnosis: Active Problems:   Bipolar II disorder (HCC)  Long Term Goal(s): Improvement in symptoms so as ready for discharge   Short Term Goals: Ability to identify changes in lifestyle to reduce recurrence of condition will improve Ability to verbalize feelings will improve Ability to disclose and discuss suicidal ideas Ability to demonstrate self-control will improve Ability to identify and develop effective coping behaviors will improve Ability to maintain clinical measurements within normal limits will improve Compliance with prescribed medications will improve Ability to identify triggers associated with substance abuse/mental health issues will improve     Medication Management: Evaluate patient's response, side effects, and tolerance of medication regimen.  Therapeutic Interventions: 1 to 1 sessions, Unit Group sessions and Medication administration.  Evaluation of Outcomes: Progressing   RN Treatment Plan for Primary Diagnosis: MDD (major depressive disorder) Long Term Goal(s): Knowledge of disease and therapeutic regimen to maintain health will improve  Short Term Goals: Ability to remain free from injury will improve, Ability to verbalize frustration and anger appropriately will improve, Ability to verbalize feelings will improve, and Ability to disclose and discuss  suicidal ideas  Medication Management: RN will administer medications as ordered by provider, will assess and evaluate patient's response and provide education to patient for prescribed medication. RN will report any adverse and/or side effects to prescribing provider.  Therapeutic Interventions: 1 on 1 counseling sessions, Psychoeducation, Medication administration, Evaluate responses to treatment, Monitor vital signs and CBGs as ordered, Perform/monitor CIWA, COWS, AIMS and Fall Risk screenings as ordered, Perform wound care treatments as ordered.  Evaluation of Outcomes: Progressing   LCSW Treatment Plan for Primary Diagnosis: MDD (major depressive disorder) Long Term Goal(s): Safe transition to appropriate next level of care at discharge, Engage patient in therapeutic group addressing interpersonal concerns.  Short Term Goals: Engage patient in aftercare planning with referrals and resources, Increase ability to appropriately verbalize feelings, Facilitate acceptance of mental health diagnosis and concerns, and Identify triggers associated with mental health/substance abuse issues  Therapeutic Interventions: Assess for all discharge needs, 1 to 1 time with Social worker, Explore available resources and support systems, Assess for adequacy in community support network, Educate family and significant other(s) on suicide prevention, Complete Psychosocial Assessment, Interpersonal group therapy.  Evaluation of Outcomes: Progressing   Progress in Treatment: Attending groups: attended some groups Participating in groups: Yes. Taking medication as prescribed: patient hasn't been prescribed any medications yet Toleration medication: Yes. Family/Significant other contact made: Yes, contacted Baillie Mohammad (mom) [phone]  Patient understands diagnosis: Yes. Discussing patient identified problems/goals with staff: Yes. Medical problems stabilized or resolved: Yes. Denies suicidal/homicidal  ideation: Yes. Issues/concerns per patient self-inventory: No.   New problem(s) identified:  No   New Short Term/Long Term Goal(s):   Patient Goals:   medication management   Discharge Plan or Barriers: Patient recently admitted. CSW will continue to follow and assess for appropriate referrals and possible discharge planning.      Reason for Continuation of Hospitalization: Anxiety Depression Suicidal ideation   Estimated Length of Stay: 4 - 6 days  Last 3 [place] Suicide Severity Risk Score: Flowsheet Row [facility] (Current[facility] Most recent reading at 07/13/2024  9:00 PM ED from 07/13/2024 in [facility] Most recent reading at 07/13/2024  2:49 AM ED from 04/22/2024 in [facility] Most recent reading at 04/22/2024  1:05 PM  C-SSRS RISK CATEGORY High Risk High Risk No Risk    Last PHQ 2/9 Scores:    11/05/2023    6:25 PM 01/01/2019    3:13 PM 08/27/2018    4:22 PM  Depression screen PHQ 2/9  Decreased Interest 1 3 0  Down, Depressed, Hopeless 2 2 0  PHQ - 2 Score 3 5 0  Altered sleeping 2 3   Tired, decreased energy 2 3   Change in appetite 0 2   Feeling bad or failure about yourself  2 1   Trouble concentrating 1 3   Moving slowly or fidgety/restless 1 2   Suicidal thoughts 3 1   PHQ-9 Score 14 20   Difficult doing work/chores Very difficult      Scribe for Treatment Team: Cheron Pasquarelli O Lakeem Rozo, LCSWA 07/18/2024 8:17 AM

## 2024-07-18 NOTE — Progress Notes (Signed)
 Patient reports mild irritability and agitation. He reports that going outside kind of triggered him because He now doesn't know when discharge is. PRN mild agitation medications given.

## 2024-07-18 NOTE — Progress Notes (Signed)
Pt did not attend goals group. 

## 2024-07-18 NOTE — Progress Notes (Signed)
   07/18/24 2042  Psych Admission Type (Psych Patients Only)  Admission Status Voluntary  Psychosocial Assessment  Patient Complaints Anxiety  Eye Contact Fair  Facial Expression Flat  Affect Appropriate to circumstance  Speech Logical/coherent  Interaction Assertive  Motor Activity Other (Comment) (WDL)  Appearance/Hygiene Unremarkable  Behavior Characteristics Appropriate to situation  Mood Anxious  Thought Process  Coherency WDL  Content WDL  Delusions None reported or observed  Perception WDL  Hallucination None reported or observed  Judgment WDL  Confusion None  Danger to Self  Current suicidal ideation? Denies  Self-Injurious Behavior No self-injurious ideation or behavior indicators observed or expressed   Agreement Not to Harm Self Yes  Description of Agreement verbal  Danger to Others  Danger to Others None reported or observed

## 2024-07-18 NOTE — Progress Notes (Addendum)
 [facility] MD Progress Note  07/18/2024 12:01 PM Samantha Hunter  MRN:  [id number]   Subjective:   Patient seen at bedside laying in bed and reports insomnia all night. Reports trazodone  gave him nausea, vomiting and nightmares previously and hydroxyzine  and melatonin do not work. Patient states doxepin  has been ineffective the last 2 nights. Patient would like to try mirtazapine  7.5 mg at bedtime. Reports continued depressed mood, anhedonia and hopelessness.  Reports continued thoughts of self harm via cutting and thoughts of suicide without a plan. Contracts for safety and denies intent.  Patient tolerating all medications well and denies side effects. I encouraged him to attend groups.   Patient is tolerating latuda  increase and starting naltrexone  this morning.     Principal Problem: MDD (major depressive disorder) Diagnosis: Active Problems:   Bipolar II disorder (HCC)  Total Time spent with patient: 30 minutes  Past Psychiatric History: see H&P  Past Medical History:  Past Medical History:  Diagnosis Date   Allergy    Anemia    Anxiety    Asthma    Cannabis use disorder, mild, abuse 03/16/2017   Depression    Gender dysphoria 05/12/2017   GERD (gastroesophageal reflux disease)    Non-suicidal self harm 03/16/2017   OCD (obsessive compulsive disorder) 03/16/2017   Suicidal ideation 03/16/2017    Past Surgical History:  Procedure Laterality Date   wisdom tooth removal Bilateral    Family History:  Family History  Problem Relation Age of Onset   Mental illness Mother    Alcohol abuse Mother    Drug abuse Mother    Depression Mother    Hyperlipidemia Mother    Mental illness Father    Alcohol abuse Father    Drug abuse Father    Depression Father    Cancer Father        skin   Heart disease Father    Hyperlipidemia Father    Stroke Father    Mental illness Brother    Anxiety disorder Brother    Depression Brother    Anxiety disorder Sister    Depression Sister    Mental  illness Sister    Alcohol abuse Maternal Uncle    Drug abuse Maternal Uncle    Alcohol abuse Maternal Grandfather    Mental illness Brother    Mental illness Sister    Family Psychiatric  History: see H&P Social History:  Social History   Substance and Sexual Activity  Alcohol Use Not Currently     Social History   Substance and Sexual Activity  Drug Use Not Currently   Frequency: 14.0 times per week   Types: Marijuana, Cocaine   Comment: Coke, weed, and xanax    Social History   Socioeconomic History   Marital status: Single    Spouse name: Not on file   Number of children: Not on file   Years of education: Not on file   Highest education level: Not on file  Occupational History   Not on file  Tobacco Use   Smoking status: Every Day    Current packs/day: 0.25    Average packs/day: 0.3 packs/day for 6.6 years (1.7 ttl pk-yrs)    Types: Cigarettes    Start date: 02/08/2017    Last attempt to quit: 02/08/2018   Smokeless tobacco: Former    Types: Chew   Tobacco comments:    Declined cessation  Vaping Use   Vaping status: Every Day  Substance and Sexual Activity   Alcohol  use: Not Currently   Drug use: Not Currently    Frequency: 14.0 times per week    Types: Marijuana, Cocaine    Comment: Coke, weed, and xanax   Sexual activity: Yes    Birth control/protection: Surgical, Other-see comments    Comment: spouse has had vasectomy, pt amenorrheic on testosterone   Other Topics Concern   Not on file  Social History Narrative   Not on file   Social Drivers of Health   Financial Resource Strain: Not on file  Food Insecurity: No Food Insecurity (07/13/2024)   Hunger Vital Sign    Worried About Running Out of Food in the Last Year: Never true    Ran Out of Food in the Last Year: Never true  Transportation Needs: No Transportation Needs (07/13/2024)   PRAPARE - Administrator, Civil Service (Medical): No    Lack of Transportation (Non-Medical): No   Physical Activity: Not on file  Stress: Not on file  Social Connections: Not on file   Additional Social History:                         Sleep: Poor Estimated Sleeping Duration (Last 24 Hours): 7.75 hours  Appetite:  Fair  Current Medications: Current Facility-Administered Medications  Medication Dose Route Frequency Provider Last Rate Last Admin   acetaminophen  (TYLENOL ) tablet 650 mg  650 mg Oral Q6H PRN Motley-Mangrum, Jadeka A, PMHNP       albuterol  (VENTOLIN  HFA) 108 (90 Base) MCG/ACT inhaler 2 puff  2 puff Inhalation Q4H PRN Furkan Keenum, MD       alum & mag hydroxide-simeth (MAALOX/MYLANTA) 200-200-20 MG/5ML suspension 30 mL  30 mL Oral Q4H PRN Motley-Mangrum, Jadeka A, PMHNP       buPROPion  (WELLBUTRIN  XL) 24 hr tablet 150 mg  150 mg Oral Daily Karrin Eisenmenger, MD   150 mg at 07/18/24 0855   haloperidol  (HALDOL ) tablet 5 mg  5 mg Oral TID PRN Motley-Mangrum, Jadeka A, PMHNP       And   diphenhydrAMINE  (BENADRYL ) capsule 50 mg  50 mg Oral TID PRN Motley-Mangrum, Jadeka A, PMHNP       haloperidol  lactate (HALDOL ) injection 5 mg  5 mg Intramuscular TID PRN Motley-Mangrum, Jadeka A, PMHNP       And   diphenhydrAMINE  (BENADRYL ) injection 50 mg  50 mg Intramuscular TID PRN Motley-Mangrum, Jadeka A, PMHNP       And   LORazepam  (ATIVAN ) injection 2 mg  2 mg Intramuscular TID PRN Motley-Mangrum, Jadeka A, PMHNP       haloperidol  lactate (HALDOL ) injection 10 mg  10 mg Intramuscular TID PRN Motley-Mangrum, Jadeka A, PMHNP       And   diphenhydrAMINE  (BENADRYL ) injection 50 mg  50 mg Intramuscular TID PRN Motley-Mangrum, Jadeka A, PMHNP       And   LORazepam  (ATIVAN ) injection 2 mg  2 mg Intramuscular TID PRN Motley-Mangrum, Jadeka A, PMHNP       divalproex  (DEPAKOTE  ER) 24 hr tablet 500 mg  500 mg Oral QHS Marguarite Markov, MD   500 mg at 07/17/24 2145   doxepin  (SINEQUAN ) capsule 10 mg  10 mg Oral QHS PRN Tahlor Berenguer, MD   10 mg at 07/17/24 2145   hydrOXYzine  (ATARAX )  tablet 25 mg  25 mg Oral TID PRN Motley-Mangrum, Jadeka A, PMHNP   25 mg at 07/17/24 2147   lurasidone  (LATUDA ) tablet 40 mg  40 mg Oral QPC  supper Tamara Monteith, MD   40 mg at 07/17/24 1809   magnesium  hydroxide (MILK OF MAGNESIA) suspension 30 mL  30 mL Oral Daily PRN Motley-Mangrum, Jadeka A, PMHNP       melatonin tablet 3 mg  3 mg Oral QHS Danyele Smejkal, MD   3 mg at 07/17/24 2145   naltrexone  (DEPADE) tablet 50 mg  50 mg Oral Daily Hayle Parisi, MD   50 mg at 07/18/24 0855   nicotine  (NICODERM CQ  - dosed in mg/24 hours) patch 21 mg  21 mg Transdermal Daily Teddy Pena, MD   21 mg at 07/18/24 0855   nicotine  polacrilex (NICORETTE ) gum 4 mg  4 mg Oral PRN Morayo Leven, MD   4 mg at 07/18/24 0859    Lab Results:  No results found for this or any previous visit (from the past 48 hours).   Blood Alcohol level:  Lab Results  Component Value Date   [facility] <15 07/13/2024   ETH <10 11/05/2023    Metabolic Disorder Labs: Lab Results  Component Value Date   HGBA1C 5.4 07/13/2024   MPG 108 07/13/2024   MPG 111.15 11/05/2023   Lab Results  Component Value Date   PROLACTIN 32.1 07/13/2024   Lab Results  Component Value Date   CHOL 151 07/13/2024   TRIG 59 07/13/2024   HDL 53 07/13/2024   CHOLHDL 2.8 07/13/2024   VLDL 12 07/13/2024   LDLCALC 86 07/13/2024   LDLCALC 126 (H) 11/05/2023    Physical Findings: AIMS:  ,  ,  ,  ,  ,  ,   CIWA:    COWS:     Musculoskeletal: Strength & Muscle Tone: within normal limits Gait & Station: normal Patient leans: N/A  Psychiatric Specialty Exam:  Presentation  General Appearance:  Appropriate for Environment  Eye Contact: Fair  Speech: Clear and Coherent  Speech Volume: Decreased  Handedness: Right   Mood and Affect  Mood: Anxious; Depressed  Affect: Blunt   Thought Process  Thought Processes: Coherent  Descriptions of Associations:Intact  Orientation:Full (Time, Place and Person)  Thought  Content:Logical  History of Schizophrenia/Schizoaffective disorder:No  Duration of Psychotic Symptoms:N/A  Hallucinations: denies today  Ideas of Reference:None  Suicidal Thoughts:SI, intrusive thoughts of cutting, denies intent   Homicidal Thoughts:denies   Sensorium  Memory: Immediate Fair  Judgment: Fair  Insight: Fair   Art therapist  Concentration: Fair  Attention Span: Fair  Recall: [employer] of Knowledge: Fair  Language: Fair   Psychomotor Activity  Psychomotor Activity: slowed   Assets  Assets: Manufacturing systems engineer; Desire for Improvement; Physical Health; Resilience; Social Support   Sleep  Sleep: fair    Physical Exam: Physical Exam ROS Blood pressure 116/83, pulse 74, temperature 98.1 F (36.7 C), temperature source Oral, resp. rate 16, height 5' 5 (1.651 m), weight 129.5 kg, SpO2 100%. Body mass index is 47.51 kg/m.   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 States he has been manic in the past and that after extensive discussion with therapist and his psychiatrist they feels he has bipolar depression. Patient described symptoms consistent with bipolar II disorder as well as cluster B traits consistent with borderline personality. However does described periods of not sleeping for days consistent with mania. Patient is not currently manic but is depressed and having worsening thoughts of cutting and suicide. HE has 2 prior  suicide attempts and is at elevated acute risk for suicide.   I screened patient for borderline personality disorder as well and he resonated with mood lability, impulsivity, unstable sense of self and identity, unstable relationships with others, periods of cutting and transient periods of psychosis. I discussed patient may benefit from DBT on an outpatient  basis to which he was agreeable.  Will restart Wellbutrin  and Depakote  that patient has previously found helpful. Patient states doses greater than 500 mg were too sedating but would be interested in starting adjuncts of Latuda  and Naltrexone  if these medications are tolerated well. Discussed r/b/a of all of below changes.  8/5: patient remains suicidal with thoughts of cutting himself and with depression. Agreeable to trial of Latuda  for bipolar depression as did not tolerated Seroquel  or Zyprexa  well previously and is concerned for weight gain as patient is obese. 2 prior suicide attempts and patient remains at high acute risk for suicide.  8/6: patient remains significantly depressed and suicidal. Intrusive thoughts of self harm but contracts for safety. Tolerating Latuda  for bipolar depression without side effects and will continue to titrate. Patient's outpatient psychiatrist had recommended he try naltrexone  both for substance cravings as well as impulsivity. Patient requesting trial which I think is appropriate.   8/7: patient remains significantly depressed and suicidal. Intrusive thoughts of self harm via cutting but contracts for safety. Agreeable to titration of Latuda  and starting naltrexone  as recommended by outpatient psychiatrist.   8/8: patient remains significantly depressed and suicidal. Intrusive thoughts of self harm via cutting but contracts for safety. Tolerating increase of Latuda  and starting naltrexone . Patient remains at elevated acute risk for suicide.  Presents with cluster B traits the last 2 days with staff. Would benefit from DBT on outpatient basis once psychiatrically stabilized.  Patient states he takes testosterone  injection which he self administers every Thursday of 0.5 ml. The patient states he self administers testosterone  0.5 ml every Thursday [facility]. States he gets 6 month supply and last filled at [facility] on [address] in  [place]. I called and left a voicemail as they dont answer the phone requesting callback for medication verification.   Treatment Plan Summary: Daily contact with patient to assess and evaluate symptoms and progress in treatment, Medication management, and Plan as below   Observation Level/Precautions:  15 minute checks  Laboratory:  CBC Chemistry Profile Folic Acid HbAIC UDS Vitamin B-12  Psychotherapy: engaged    Medications:  Wellbutrin  and Depakote   Consultations:    Discharge Concerns:  elevated acute risk for self harm and suicide  Estimated LOS: 5-7 days  Other:      Safety and Monitoring: voluntarily admission to inpatient psychiatric unit for safety, stabilization and treatment Daily contact with patient to assess and evaluate symptoms and progress in treatment Patient's case to be discussed in multi-disciplinary team meeting Observation Level : q15 minute checks Vital signs: q12 hours Precautions: suicide, elopement, and assault   2. Psychiatric Problems   Bipolar II disorder -cont Depakote  ER 500 mg at bedtime -cont Wellbutrin  XL 150 mg qdaily -cont naltrexone  50 mg qdaily tomorrow AM as patient's outpatient psychiatrist recommended for impulsivity as well as substance abuse cravings in setting of BPD traits -contLatuda to 40 mg with dinner for bipolar depression and patient is agreeable    - hydroxyzine  25 mg TID- PRN for anxiety -stop trazodone  50 mg at bedtime-PRN for insomnia as pateitn reports it gives him nightmares and it is ineffective Stop Doxepin  10 mg  at bedtime as reports has been ineffective the last 2 nights. 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 Medical Management Covid  negative CMP: neg CBC: unremarkable EtOH: <10 UDS: not completed I reored TSH:  wnl A1C: 5.4 Lipids: wnl BAL<15   #astham -albuterol  inhaler restarted PRN   Dispo; patient remains suicidal, likely discharge in 5-7 days  Niasia Lanphear, MD 07/18/2024, 12:01 PM

## 2024-07-18 NOTE — Progress Notes (Signed)
   07/18/24 0000  Psych Admission Type (Psych Patients Only)  Admission Status Voluntary  Psychosocial Assessment  Patient Complaints Depression  Eye Contact Fair  Facial Expression Flat  Affect Depressed  Speech Logical/coherent  Interaction Guarded  Motor Activity Slow  Appearance/Hygiene Unremarkable  Behavior Characteristics Appropriate to situation  Mood Depressed  Aggressive Behavior  Effect No apparent injury  Thought Process  Coherency WDL  Content WDL  Delusions None reported or observed  Perception WDL  Hallucination None reported or observed  Judgment Poor  Confusion None  Danger to Self  Current suicidal ideation? Passive   (Sleep Hours) -8.25 (Any PRNs that were needed, meds refused, or side effects to meds)- doxepin ,atarax , and nicorette  gum (Any disturbances and when (visitation, over night)-none (Concerns raised by the patient)- none (SI/HI/AVH)-endorses SI, but contracts for safety.

## 2024-07-18 NOTE — Group Note (Signed)
 Recreation Therapy Group Note   Group Topic:Problem Solving  Group Date: 07/18/2024 Start Time: 0930 End Time: 1005 Facilitators: Alekzander Cardell-McCall, LRT,CTRS Location: [address] Dayroom   Group Topic: Problem Solving  Goal Area(s) Addresses:  Patient will identify positive ways to complete puzzles presented.  Patient will work effectively with peer to solve the puzzles presented in group.  Behavioral Response:    Intervention: Group Work, Problem Solving  Activity: In pairs, patients were asked to create a game with their teammate. Team's were tasked with designing a game, including a Name, Description of Game, Equipment/Supplies, Rules, and Number of players needed. Teach back used to present their game idea to the group.  Education:  Leisure Scientist, physiological, Special educational needs teacher, Teamwork, Discharge Planning  Education Outcome: Acknowledges education/In group clarification offered/Needs additional education.    Affect/Mood: N/A   Participation Level: Did not attend    Clinical Observations/Individualized Feedback:      Plan: Continue to engage patient in RT group sessions 2-3x/week.   Macy Polio-McCall, LRT,CTRS 07/18/2024 12:48 PM

## 2024-07-19 MED ORDER — DIPHENHYDRAMINE HCL 25 MG PO CAPS
50.0000 mg | ORAL_CAPSULE | Freq: Every evening | ORAL | Status: DC | PRN
  Administered 2024-07-19 – 2024-07-21 (×4): 50 mg via ORAL
  Filled 2024-07-19 (×3): qty 2

## 2024-07-19 MED ORDER — LURASIDONE HCL 40 MG PO TABS
60.0000 mg | ORAL_TABLET | Freq: Every day | ORAL | Status: DC
  Administered 2024-07-19 – 2024-07-21 (×4): 60 mg via ORAL
  Filled 2024-07-19 (×3): qty 1

## 2024-07-19 NOTE — Plan of Care (Signed)
  Problem: Activity: Goal: Sleeping patterns will improve Outcome: Progressing   Problem: Coping: Goal: Ability to verbalize frustrations and anger appropriately will improve Outcome: Progressing   

## 2024-07-19 NOTE — Group Note (Signed)
 LCSW Group Therapy Note  @TD @ 1:15pm  Type of Therapy and Topic:  Group Therapy - Safety  Participation Level:  Did Not Attend   Description of Group This process group involved patients discussing the situations or people in their lives that frequently make them safe or unsafe.  Anxiety was a common factor among all group participants and many of them described home situations that keep them on edge and not able to feel completely safe.  Three questions were addressed during the group:  (1) What makes you feel safe (or unsafe)?  (2) Do you feel safe with yourself and why?  (3) If you don't feel safe, what can you do?  A lengthy discussion ensued in which group members empathized with each other, gave suggestions to one another, and expressed their feelings freely.  Therapeutic Goals Patient will describe what makes them feel safe or unsafe in their everyday lives. Patient will think about and discuss whether they feel safe with themselves and what reasons might contribute to feeling safe or unsafe. Patients will participate in planning for what can be done to help themselves feel safer.   Summary of Patient Progress:  NA   Therapeutic Modalities Cognitive Behavioral Therapy  Samantha Hunter O Samantha Hunter, LCSWA 07/19/2024  3:34 PM

## 2024-07-19 NOTE — Group Note (Signed)
 Date:  07/19/2024 Time:  5:49 PM  Group Topic/Focus:  Wellness Toolbox:   The focus of this group is to introduce the topic of wellness and using collage as a creative outlet for expressing emotions, reducing anxiety, fostering group cohesion, and enabling personal insight and healing.   Participation Level:  Active  Participation Quality:  Appropriate and Attentive  Affect:  Flat  Cognitive:  Alert and Appropriate  Insight: Appropriate and Good  Engagement in Group:  Engaged  Modes of Intervention:  Activity, Discussion, Education, Exploration, Rapport Building, Socialization, and Support    Berwyn GORMAN Acosta 07/19/2024, 5:49 PM

## 2024-07-19 NOTE — Group Note (Deleted)
 Date:  07/19/2024 Time:  9:26 PM  Group Topic/Focus:  Recovery Goals:   The focus of this group is to identify appropriate goals for recovery and establish a plan to achieve them.     Participation Level:  {BHH PARTICIPATION OZCZO:77735}  Participation Quality:  {BHH PARTICIPATION QUALITY:22265}  Affect:  {BHH AFFECT:22266}  Cognitive:  {BHH COGNITIVE:22267}  Insight: {BHH Insight2:20797}  Engagement in Group:  {BHH ENGAGEMENT IN HMNLE:77731}  Modes of Intervention:  {BHH MODES OF INTERVENTION:22269}  Additional Comments:  ***  Samantha Hunter L 07/19/2024, 9:26 PM

## 2024-07-19 NOTE — Group Note (Signed)
 Date:  07/19/2024 Time:  9:21 AM  Group Topic/Focus:  Goals Group:   The focus of this group is to help patients establish daily goals to achieve during treatment and discuss how the patient can incorporate goal setting into their daily lives to aide in recovery.    Participation Level:  Active  Participation Quality:  Appropriate  Affect:  Appropriate  Cognitive:  Alert  Insight: Appropriate  Engagement in Group:  Engaged  Modes of Intervention:  Discussion  Additional Comments:  Pt goal is try to attend all groups and stay awake. Pt coping skill is to use nicotine  gum.  Samantha Hunter 07/19/2024, 9:21 AM

## 2024-07-19 NOTE — Progress Notes (Signed)
 [facility] MD Progress Note  07/19/2024 2:30 PM Samantha Hunter  MRN:  [id number]   Subjective:   Patient states if I'm honest, I'm still having suicidal thoughts and thoughts of cutting. Reports continued depressed mood, anhedonia and hopelessness.  Improved energy today and patient notes sleeping well and is visible on the unit. Patient reports being agitated last night and received Haldol  and benadryl . Patient is visibly calmer today and states he slept well. He would like to try a higher dose of Latuda  as well as Remeron  tonight with backup of PRN Benadryl . States that he has previously completed PHP and IOP programs here. Patient tolerating all medications well and denies side effects. I encouraged him to attend groups.   Patient is tolerating latuda  and naltrexone  and is agreeable to further increase of Latuda .     Reports trazodone  gave him nausea, vomiting and nightmares previously and hydroxyzine  and melatonin do not work. Patient states doxepin  has been ineffective. Patient would like to try mirtazapine  7.5 mg at bedtime.   Principal Problem: MDD (major depressive disorder) Diagnosis: Active Problems:   Bipolar II disorder (HCC)  Total Time spent with patient: 30 minutes  Past Psychiatric History: see H&P  Past Medical History:  Past Medical History:  Diagnosis Date   Allergy    Anemia    Anxiety    Asthma    Cannabis use disorder, mild, abuse 03/16/2017   Depression    Gender dysphoria 05/12/2017   GERD (gastroesophageal reflux disease)    Non-suicidal self harm 03/16/2017   OCD (obsessive compulsive disorder) 03/16/2017   Suicidal ideation 03/16/2017    Past Surgical History:  Procedure Laterality Date   wisdom tooth removal Bilateral    Family History:  Family History  Problem Relation Age of Onset   Mental illness Mother    Alcohol abuse Mother    Drug abuse Mother    Depression Mother    Hyperlipidemia Mother    Mental illness Father    Alcohol abuse Father    Drug abuse  Father    Depression Father    Cancer Father        skin   Heart disease Father    Hyperlipidemia Father    Stroke Father    Mental illness Brother    Anxiety disorder Brother    Depression Brother    Anxiety disorder Sister    Depression Sister    Mental illness Sister    Alcohol abuse Maternal Uncle    Drug abuse Maternal Uncle    Alcohol abuse Maternal Grandfather    Mental illness Brother    Mental illness Sister    Family Psychiatric  History: see H&P Social History:  Social History   Substance and Sexual Activity  Alcohol Use Not Currently     Social History   Substance and Sexual Activity  Drug Use Not Currently   Frequency: 14.0 times per week   Types: Marijuana, Cocaine   Comment: Coke, weed, and xanax    Social History   Socioeconomic History   Marital status: Single    Spouse name: Not on file   Number of children: Not on file   Years of education: Not on file   Highest education level: Not on file  Occupational History   Not on file  Tobacco Use   Smoking status: Every Day    Current packs/day: 0.25    Average packs/day: 0.3 packs/day for 6.6 years (1.7 ttl pk-yrs)    Types: Cigarettes  Start date: 02/08/2017    Last attempt to quit: 02/08/2018   Smokeless tobacco: Former    Types: Chew   Tobacco comments:    Declined cessation  Vaping Use   Vaping status: Every Day  Substance and Sexual Activity   Alcohol use: Not Currently   Drug use: Not Currently    Frequency: 14.0 times per week    Types: Marijuana, Cocaine    Comment: Coke, weed, and xanax   Sexual activity: Yes    Birth control/protection: Surgical, Other-see comments    Comment: spouse has had vasectomy, pt amenorrheic on testosterone   Other Topics Concern   Not on file  Social History Narrative   Not on file   Social Drivers of Health   Financial Resource Strain: Not on file  Food Insecurity: No Food Insecurity (07/13/2024)   Hunger Vital Sign    Worried About Running  Out of Food in the Last Year: Never true    Ran Out of Food in the Last Year: Never true  Transportation Needs: No Transportation Needs (07/13/2024)   PRAPARE - Administrator, Civil Service (Medical): No    Lack of Transportation (Non-Medical): No  Physical Activity: Not on file  Stress: Not on file  Social Connections: Not on file   Additional Social History:                         Sleep: Poor Estimated Sleeping Duration (Last 24 Hours): 6.00-7.75 hours  Appetite:  Fair  Current Medications: Current Facility-Administered Medications  Medication Dose Route Frequency Provider Last Rate Last Admin   acetaminophen  (TYLENOL ) tablet 650 mg  650 mg Oral Q6H PRN Motley-Mangrum, Jadeka A, PMHNP       albuterol  (VENTOLIN  HFA) 108 (90 Base) MCG/ACT inhaler 2 puff  2 puff Inhalation Q4H PRN Horrace Hanak, MD       alum & mag hydroxide-simeth (MAALOX/MYLANTA) 200-200-20 MG/5ML suspension 30 mL  30 mL Oral Q4H PRN Motley-Mangrum, Jadeka A, PMHNP       buPROPion  (WELLBUTRIN  XL) 24 hr tablet 150 mg  150 mg Oral Daily Pryor Guettler, MD   150 mg at 07/19/24 9187   haloperidol  (HALDOL ) tablet 5 mg  5 mg Oral TID PRN Motley-Mangrum, Jadeka A, PMHNP   5 mg at 07/18/24 1625   And   diphenhydrAMINE  (BENADRYL ) capsule 50 mg  50 mg Oral TID PRN Motley-Mangrum, Jadeka A, PMHNP   50 mg at 07/18/24 1625   diphenhydrAMINE  (BENADRYL ) capsule 50 mg  50 mg Oral QHS PRN Dawnna Gritz, MD       haloperidol  lactate (HALDOL ) injection 5 mg  5 mg Intramuscular TID PRN Motley-Mangrum, Jadeka A, PMHNP       And   diphenhydrAMINE  (BENADRYL ) injection 50 mg  50 mg Intramuscular TID PRN Motley-Mangrum, Jadeka A, PMHNP       And   LORazepam  (ATIVAN ) injection 2 mg  2 mg Intramuscular TID PRN Motley-Mangrum, Jadeka A, PMHNP       haloperidol  lactate (HALDOL ) injection 10 mg  10 mg Intramuscular TID PRN Motley-Mangrum, Jadeka A, PMHNP       And   diphenhydrAMINE  (BENADRYL ) injection 50 mg  50 mg  Intramuscular TID PRN Motley-Mangrum, Jadeka A, PMHNP       And   LORazepam  (ATIVAN ) injection 2 mg  2 mg Intramuscular TID PRN Motley-Mangrum, Jadeka A, PMHNP       divalproex  (DEPAKOTE  ER) 24 hr tablet 500 mg  500 mg Oral QHS Ameenah Prosser, MD   500 mg at 07/18/24 2135   hydrOXYzine  (ATARAX ) tablet 25 mg  25 mg Oral TID PRN Motley-Mangrum, Jadeka A, PMHNP   25 mg at 07/18/24 1237   lurasidone  (LATUDA ) tablet 60 mg  60 mg Oral QPC supper Everlyn Farabaugh, MD       magnesium  hydroxide (MILK OF MAGNESIA) suspension 30 mL  30 mL Oral Daily PRN Motley-Mangrum, Jadeka A, PMHNP       melatonin tablet 3 mg  3 mg Oral QHS Duran Ohern, MD   3 mg at 07/18/24 2135   mirtazapine  (REMERON ) tablet 7.5 mg  7.5 mg Oral QHS Whittley Carandang, MD   7.5 mg at 07/18/24 2135   naltrexone  (DEPADE) tablet 50 mg  50 mg Oral Daily Renell Coaxum, MD   50 mg at 07/19/24 0813   nicotine  (NICODERM CQ  - dosed in mg/24 hours) patch 21 mg  21 mg Transdermal Daily Graziella Connery, MD   21 mg at 07/19/24 9188   nicotine  polacrilex (NICORETTE ) gum 4 mg  4 mg Oral PRN Jovi Alvizo, MD   4 mg at 07/19/24 1253   testosterone  cypionate (DEPOTESTOSTERONE CYPIONATE) injection 100 mg  100 mg Intramuscular Q7 days Miarose Lippert, MD   100 mg at 07/18/24 1505    Lab Results:  No results found for this or any previous visit (from the past 48 hours).   Blood Alcohol level:  Lab Results  Component Value Date   [facility] <15 07/13/2024   ETH <10 11/05/2023    Metabolic Disorder Labs: Lab Results  Component Value Date   HGBA1C 5.4 07/13/2024   MPG 108 07/13/2024   MPG 111.15 11/05/2023   Lab Results  Component Value Date   PROLACTIN 32.1 07/13/2024   Lab Results  Component Value Date   CHOL 151 07/13/2024   TRIG 59 07/13/2024   HDL 53 07/13/2024   CHOLHDL 2.8 07/13/2024   VLDL 12 07/13/2024   LDLCALC 86 07/13/2024   LDLCALC 126 (H) 11/05/2023    Physical Findings: AIMS:  ,  ,  ,  ,  ,  ,   CIWA:    COWS:      Musculoskeletal: Strength & Muscle Tone: within normal limits Gait & Station: normal Patient leans: N/A  Psychiatric Specialty Exam:  Presentation  General Appearance:  Appropriate for Environment  Eye Contact: Fair  Speech: Clear and Coherent  Speech Volume: Decreased  Handedness: Right   Mood and Affect  Mood: Anxious; Depressed  Affect: Blunt   Thought Process  Thought Processes: Coherent  Descriptions of Associations:Intact  Orientation:Full (Time, Place and Person)  Thought Content:Logical  History of Schizophrenia/Schizoaffective disorder:No  Duration of Psychotic Symptoms:N/A  Hallucinations: denies today  Ideas of Reference:None  Suicidal Thoughts:SI, intrusive thoughts of cutting, denies intent   Homicidal Thoughts:denies   Sensorium  Memory: Immediate Fair  Judgment: Fair  Insight: Fair   Art therapist  Concentration: Fair  Attention Span: Fair  Recall: [employer] of Knowledge: Fair  Language: Fair   Psychomotor Activity  Psychomotor Activity: slowed   Assets  Assets: Manufacturing systems engineer; Desire for Improvement; Physical Health; Resilience; Social Support   Sleep  Sleep: fair    Physical Exam: Physical Exam ROS Blood pressure 111/70, pulse 69, temperature 98.1 F (36.7 C), temperature source Oral, resp. rate 16, height 5' 5 (1.651 m), weight 129.5 kg, SpO2 100%. Body mass index is 47.51 kg/m.   Treatment Plan Summary:  Bipolar II disorder (  HCC) Cluster B traits (preliminary diagnosis of borderline personality disorder   Patient currently presents with a major depressive episode. States he has been manic in the past and that after extensive discussion with therapist and his psychiatrist they feels he has bipolar depression. Patient described symptoms consistent with bipolar II disorder as well as cluster B traits consistent with borderline personality. However does described periods of  not sleeping for days consistent with mania. Patient is not currently manic but is depressed and having worsening thoughts of cutting and suicide. HE has 2 prior suicide attempts and is at elevated acute risk for suicide.   I screened patient for borderline personality disorder as well and he resonated with mood lability, impulsivity, unstable sense of self and identity, unstable relationships with others, periods of cutting and transient periods of psychosis. I discussed patient may benefit from DBT on an outpatient basis to which he was agreeable.  Will restart Wellbutrin  and Depakote  that patient has previously found helpful. Patient states doses greater than 500 mg were too sedating but would be interested in starting adjuncts of Latuda  and Naltrexone  if these medications are tolerated well. Discussed r/b/a of all of below changes.  8/5: patient remains suicidal with thoughts of cutting himself and with depression. Agreeable to trial of Latuda  for bipolar depression as did not tolerated Seroquel  or Zyprexa  well previously and is concerned for weight gain as patient is obese. 2 prior suicide attempts and patient remains at high acute risk for suicide.  8/6: patient remains significantly depressed and suicidal. Intrusive thoughts of self harm but contracts for safety. Tolerating Latuda  for bipolar depression without side effects and will continue to titrate. Patient's outpatient psychiatrist had recommended he try naltrexone  both for substance cravings as well as impulsivity. Patient requesting trial which I think is appropriate.   8/7: patient remains significantly depressed and suicidal. Intrusive thoughts of self harm via cutting but contracts for safety. Agreeable to titration of Latuda  and starting naltrexone  as recommended by outpatient psychiatrist.   8/8: patient remains significantly depressed and suicidal. Intrusive thoughts of self harm via cutting but contracts for safety. Tolerating increase  of Latuda  and starting naltrexone . Patient remains at elevated acute risk for suicide.  Presents with cluster B traits the last 2 days with staff. Would benefit from DBT on outpatient basis once psychiatrically stabilized.  Patient states he takes testosterone  injection which he self administers every Thursday of 0.5 ml. The patient states he self administers testosterone  0.5 ml every Thursday [facility]. States he gets 6 month supply and last filled at [facility] on [address] in [place]. I called and left a voicemail as they dont answer the phone requesting callback for medication verification. Verified with pharmacist that patient receives 100 mg q7 days and this dose was ordered for every Friday.  8/9: patient continues to report SI and intrusive thoughts of cutting. Slept well last night. Agreeable to further increase of Latuda  for treatment of bipolar depression. Received agitation protocol last night. Prominent cluster B traits and patient has previously completed PHP and IOP with minimal improvement. AT high acute risk for suicide.    Treatment Plan Summary: Daily contact with patient to assess and evaluate symptoms and progress in treatment, Medication management, and Plan as below   Observation Level/Precautions:  15 minute checks  Laboratory:  CBC Chemistry Profile Folic Acid HbAIC UDS Vitamin B-12  Psychotherapy: engaged    Medications:  Wellbutrin  and Depakote   Consultations:    Discharge Concerns:  elevated acute risk for self harm and suicide  Estimated LOS: 5-7 days  Other:      Safety and Monitoring: voluntarily admission to inpatient psychiatric unit for safety, stabilization and treatment Daily contact with patient to assess and evaluate symptoms and progress in treatment Patient's case to be discussed in multi-disciplinary team meeting Observation Level : q15 minute checks Vital signs: q12 hours Precautions: suicide, elopement, and  assault   2. Psychiatric Problems   Bipolar II disorder -cont Depakote  ER 500 mg at bedtime -cont Wellbutrin  XL 150 mg qdaily -cont naltrexone  50 mg qdaily tomorrow AM as patient's outpatient psychiatrist recommended for impulsivity as well as substance abuse cravings in setting of BPD traits Increase Latuda  to 60 mg with dinner for bipolar depression and patient is agreeable    - hydroxyzine  25 mg TID- PRN for anxiety -stop trazodone  50 mg at bedtime-PRN for insomnia as pateitn reports it gives him nightmares and it is ineffective Stop Doxepin  10 mg at bedtime as reports has been ineffective the last 2 nights. 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 Medical Management Covid negative CMP: neg CBC: unremarkable EtOH: <10 UDS: not completed I reored TSH:  wnl A1C: 5.4 Lipids: wnl BAL<15   #astham -albuterol  inhaler restarted PRN  - transgender - patient restarted on testosterone  cypionate 100 mg IM qweekly starting 8/8; after verification of outpateint dose   Dispo; patient remains suicidal, likely discharge in 4-5  days  Antwan Pandya, MD 07/19/2024, 2:30 PM

## 2024-07-19 NOTE — Progress Notes (Signed)
   07/19/24 2323  Psych Admission Type (Psych Patients Only)  Admission Status Voluntary  Psychosocial Assessment  Patient Complaints Anxiety  Eye Contact Fair  Facial Expression Flat  Affect Appropriate to circumstance  Speech Logical/coherent  Interaction Assertive  Motor Activity Other (Comment) (WDL)  Appearance/Hygiene Unremarkable  Behavior Characteristics Appropriate to situation  Mood Anxious;Depressed  Thought Process  Coherency WDL  Content WDL  Delusions None reported or observed  Perception WDL  Hallucination None reported or observed  Judgment WDL  Confusion None  Danger to Self  Current suicidal ideation? Denies  Self-Injurious Behavior No self-injurious ideation or behavior indicators observed or expressed   Agreement Not to Harm Self Yes  Description of Agreement verbal  Danger to Others  Danger to Others None reported or observed

## 2024-07-19 NOTE — Progress Notes (Signed)
(  Sleep Hours) - 8 hours (Any PRNs that were needed, meds refused, or side effects to meds)- Nicorette  given x2 (Any disturbances and when (visitation, over night)- None (Concerns raised by the patient)-  None (SI/HI/AVH)-  Denies

## 2024-07-19 NOTE — Group Note (Signed)
 Date:  07/19/2024 Time:  9:05 PM  Group Topic/Focus:  Wrap-Up Group:   The focus of this group is to help patients review their daily goal of treatment and discuss progress on daily workbooks.    Participation Level:  Active  Participation Quality:  Appropriate  Affect:  Appropriate  Cognitive:  Appropriate  Insight: Appropriate  Engagement in Group:  Engaged  Modes of Intervention:  Education  Additional Comments:  Pt attended and participated in group. Rated day 5/10.  [name] 07/19/2024, 9:05 PM

## 2024-07-20 MED ORDER — QUETIAPINE FUMARATE 25 MG PO TABS
25.0000 mg | ORAL_TABLET | Freq: Once | ORAL | Status: AC
  Administered 2024-07-20: 25 mg via ORAL
  Filled 2024-07-20: qty 1

## 2024-07-20 NOTE — Plan of Care (Signed)
 Nurse discussed anxiety, depression and coping skills with patient.

## 2024-07-20 NOTE — Plan of Care (Signed)
  Problem: Education: Goal: Mental status will improve Outcome: Progressing   Problem: Activity: Goal: Sleeping patterns will improve Outcome: Progressing   

## 2024-07-20 NOTE — Progress Notes (Addendum)
 [facility] MD Progress Note  07/20/2024 1:26 PM Samantha Hunter  MRN:  [id number]   Subjective:   Yesterday patient reported to me if I'm honest, I'm still having suicidal thoughts and thoughts of cutting.  Patient tells me today he is anxious to be discharge next week. Patient is irritable and presenting with cluster B traits stating The staff and social worker were very rude to me. Patient reports that he slept with mirtazapine  and hydroxyzine  last night. Reports intrusive thoughts of cutting today and high anxiety but denies thoughts of killing himself.  Reports continued depressed mood, and irritability. I discussed akathisia symptoms with patient and he denies and states this is how I always am. Denies side effect with Latuda  increase. I discussed trial of low dose Seroquel  to see if it is helpful with anxiety and racing thoughts as patient states the hydroxyzine  he took was not at all helpful.States that he has previously completed PHP and IOP programs here and notes minimal benefit. Patient tolerating all medications well and denies side effects. I encouraged him to attend groups.   Patient is tolerating latuda  and naltrexone .  Reports trazodone  gave him nausea, vomiting and nightmares previously and hydroxyzine  and melatonin do not work. Patient states doxepin  has been ineffective.  Principal Problem: MDD (major depressive disorder) Diagnosis: Active Problems:   Bipolar II disorder (HCC)  Total Time spent with patient: 30 minutes  Past Psychiatric History: see H&P  Past Medical History:  Past Medical History:  Diagnosis Date   Allergy    Anemia    Anxiety    Asthma    Cannabis use disorder, mild, abuse 03/16/2017   Depression    Gender dysphoria 05/12/2017   GERD (gastroesophageal reflux disease)    Non-suicidal self harm 03/16/2017   OCD (obsessive compulsive disorder) 03/16/2017   Suicidal ideation 03/16/2017    Past Surgical History:  Procedure Laterality Date   wisdom tooth  removal Bilateral    Family History:  Family History  Problem Relation Age of Onset   Mental illness Mother    Alcohol abuse Mother    Drug abuse Mother    Depression Mother    Hyperlipidemia Mother    Mental illness Father    Alcohol abuse Father    Drug abuse Father    Depression Father    Cancer Father        skin   Heart disease Father    Hyperlipidemia Father    Stroke Father    Mental illness Brother    Anxiety disorder Brother    Depression Brother    Anxiety disorder Sister    Depression Sister    Mental illness Sister    Alcohol abuse Maternal Uncle    Drug abuse Maternal Uncle    Alcohol abuse Maternal Grandfather    Mental illness Brother    Mental illness Sister    Family Psychiatric  History: see H&P Social History:  Social History   Substance and Sexual Activity  Alcohol Use Not Currently     Social History   Substance and Sexual Activity  Drug Use Not Currently   Frequency: 14.0 times per week   Types: Marijuana, Cocaine   Comment: Coke, weed, and xanax    Social History   Socioeconomic History   Marital status: Single    Spouse name: Not on file   Number of children: Not on file   Years of education: Not on file   Highest education level: Not on file  Occupational History  Not on file  Tobacco Use   Smoking status: Every Day    Current packs/day: 0.25    Average packs/day: 0.3 packs/day for 6.6 years (1.7 ttl pk-yrs)    Types: Cigarettes    Start date: 02/08/2017    Last attempt to quit: 02/08/2018   Smokeless tobacco: Former    Types: Chew   Tobacco comments:    Declined cessation  Vaping Use   Vaping status: Every Day  Substance and Sexual Activity   Alcohol use: Not Currently   Drug use: Not Currently    Frequency: 14.0 times per week    Types: Marijuana, Cocaine    Comment: Coke, weed, and xanax   Sexual activity: Yes    Birth control/protection: Surgical, Other-see comments    Comment: spouse has had vasectomy, pt  amenorrheic on testosterone   Other Topics Concern   Not on file  Social History Narrative   Not on file   Social Drivers of Health   Financial Resource Strain: Not on file  Food Insecurity: No Food Insecurity (07/13/2024)   Hunger Vital Sign    Worried About Running Out of Food in the Last Year: Never true    Ran Out of Food in the Last Year: Never true  Transportation Needs: No Transportation Needs (07/13/2024)   PRAPARE - Administrator, Civil Service (Medical): No    Lack of Transportation (Non-Medical): No  Physical Activity: Not on file  Stress: Not on file  Social Connections: Not on file   Additional Social History:                         Sleep: Poor Estimated Sleeping Duration (Last 24 Hours): 9.25-11.50 hours  Appetite:  Fair  Current Medications: Current Facility-Administered Medications  Medication Dose Route Frequency Provider Last Rate Last Admin   acetaminophen  (TYLENOL ) tablet 650 mg  650 mg Oral Q6H PRN Motley-Mangrum, Jadeka A, PMHNP       albuterol  (VENTOLIN  HFA) 108 (90 Base) MCG/ACT inhaler 2 puff  2 puff Inhalation Q4H PRN Corita Allinson, MD       alum & mag hydroxide-simeth (MAALOX/MYLANTA) 200-200-20 MG/5ML suspension 30 mL  30 mL Oral Q4H PRN Motley-Mangrum, Jadeka A, PMHNP       buPROPion  (WELLBUTRIN  XL) 24 hr tablet 150 mg  150 mg Oral Daily Zhamir Pirro, MD   150 mg at 07/20/24 9171   haloperidol  (HALDOL ) tablet 5 mg  5 mg Oral TID PRN Motley-Mangrum, Jadeka A, PMHNP   5 mg at 07/18/24 1625   And   diphenhydrAMINE  (BENADRYL ) capsule 50 mg  50 mg Oral TID PRN Motley-Mangrum, Jadeka A, PMHNP   50 mg at 07/18/24 1625   diphenhydrAMINE  (BENADRYL ) capsule 50 mg  50 mg Oral QHS PRN Sandhya Denherder, MD   50 mg at 07/19/24 2117   haloperidol  lactate (HALDOL ) injection 5 mg  5 mg Intramuscular TID PRN Motley-Mangrum, Jadeka A, PMHNP       And   diphenhydrAMINE  (BENADRYL ) injection 50 mg  50 mg Intramuscular TID PRN Motley-Mangrum, Jadeka A,  PMHNP       And   LORazepam  (ATIVAN ) injection 2 mg  2 mg Intramuscular TID PRN Motley-Mangrum, Jadeka A, PMHNP       haloperidol  lactate (HALDOL ) injection 10 mg  10 mg Intramuscular TID PRN Motley-Mangrum, Jadeka A, PMHNP       And   diphenhydrAMINE  (BENADRYL ) injection 50 mg  50 mg Intramuscular TID PRN Motley-Mangrum,  Cathaleen LABOR, PMHNP       And   LORazepam  (ATIVAN ) injection 2 mg  2 mg Intramuscular TID PRN Motley-Mangrum, Jadeka A, PMHNP       divalproex  (DEPAKOTE  ER) 24 hr tablet 500 mg  500 mg Oral QHS Verda Mehta, MD   500 mg at 07/19/24 2115   hydrOXYzine  (ATARAX ) tablet 25 mg  25 mg Oral TID PRN Motley-Mangrum, Jadeka A, PMHNP   25 mg at 07/20/24 1312   lurasidone  (LATUDA ) tablet 60 mg  60 mg Oral QPC supper Ondine Gemme, MD   60 mg at 07/19/24 1803   magnesium  hydroxide (MILK OF MAGNESIA) suspension 30 mL  30 mL Oral Daily PRN Motley-Mangrum, Jadeka A, PMHNP       melatonin tablet 3 mg  3 mg Oral QHS Zyon Grout, MD   3 mg at 07/19/24 2115   mirtazapine  (REMERON ) tablet 7.5 mg  7.5 mg Oral QHS Cruze Zingaro, MD   7.5 mg at 07/19/24 2116   naltrexone  (DEPADE) tablet 50 mg  50 mg Oral Daily Renly Roots, MD   50 mg at 07/20/24 9171   nicotine  (NICODERM CQ  - dosed in mg/24 hours) patch 21 mg  21 mg Transdermal Daily Mahmoud Blazejewski, MD   21 mg at 07/20/24 9171   nicotine  polacrilex (NICORETTE ) gum 4 mg  4 mg Oral PRN Rayshon Albaugh, MD   4 mg at 07/20/24 1314   QUEtiapine  (SEROQUEL ) tablet 25 mg  25 mg Oral Once Linwood Gullikson, MD       testosterone  cypionate (DEPOTESTOSTERONE CYPIONATE) injection 100 mg  100 mg Intramuscular Q7 days Floella Ensz, MD   100 mg at 07/18/24 1505    Lab Results:  No results found for this or any previous visit (from the past 48 hours).   Blood Alcohol level:  Lab Results  Component Value Date   [facility] <15 07/13/2024   ETH <10 11/05/2023    Metabolic Disorder Labs: Lab Results  Component Value Date   HGBA1C 5.4 07/13/2024   MPG 108 07/13/2024    MPG 111.15 11/05/2023   Lab Results  Component Value Date   PROLACTIN 32.1 07/13/2024   Lab Results  Component Value Date   CHOL 151 07/13/2024   TRIG 59 07/13/2024   HDL 53 07/13/2024   CHOLHDL 2.8 07/13/2024   VLDL 12 07/13/2024   LDLCALC 86 07/13/2024   LDLCALC 126 (H) 11/05/2023    Physical Findings: AIMS:  ,  ,  ,  ,  ,  ,   CIWA:    COWS:     Musculoskeletal: Strength & Muscle Tone: within normal limits Gait & Station: normal Patient leans: N/A  Psychiatric Specialty Exam:  Presentation  General Appearance:  Appropriate for Environment  Eye Contact: Fair  Speech: Clear and Coherent  Speech Volume: Decreased  Handedness: Right   Mood and Affect  Mood: Anxious; Depressed Irritated Affect: Blunt   Thought Process  Thought Processes: Coherent  Descriptions of Associations:Intact  Orientation:Full (Time, Place and Person)  Thought Content:Logical  History of Schizophrenia/Schizoaffective disorder:No  Duration of Psychotic Symptoms:N/A  Hallucinations: denies today  Ideas of Reference:None  Suicidal Thoughts:denies SI today but appears to be minimizing, intrusive thoughts of cutting, denies intent   Homicidal Thoughts:denies   Sensorium  Memory: Immediate Fair  Judgment: Fair  Insight: Fair   Art therapist  Concentration: Fair  Attention Span: Fair  Recall: [employer] of Knowledge: Fair  Language: Fair   Psychomotor Activity  Psychomotor Activity:  slowed   Assets  Assets: Manufacturing systems engineer; Desire for Improvement; Physical Health; Resilience; Social Support   Sleep  Sleep: fair    Physical Exam: Physical Exam ROS Blood pressure 119/85, pulse 73, temperature 98.1 F (36.7 C), temperature source Oral, resp. rate 12, height 5' 5 (1.651 m), weight 129.5 kg, SpO2 100%. Body mass index is 47.51 kg/m.   Treatment Plan Summary:  Bipolar II disorder (HCC) Cluster B traits (preliminary  diagnosis of borderline personality disorder   Patient currently presents with a major depressive episode. States he has been manic in the past and that after extensive discussion with therapist and his psychiatrist they feels he has bipolar depression. Patient described symptoms consistent with bipolar II disorder as well as cluster B traits consistent with borderline personality. However does described periods of not sleeping for days consistent with mania. Patient is not currently manic but is depressed and having worsening thoughts of cutting and suicide. HE has 2 prior suicide attempts and is at elevated acute risk for suicide.   I screened patient for borderline personality disorder as well and he resonated with mood lability, impulsivity, unstable sense of self and identity, unstable relationships with others, periods of cutting and transient periods of psychosis. I discussed patient may benefit from DBT on an outpatient basis to which he was agreeable.  Will restart Wellbutrin  and Depakote  that patient has previously found helpful. Patient states doses greater than 500 mg were too sedating but would be interested in starting adjuncts of Latuda  and Naltrexone  if these medications are tolerated well. Discussed r/b/a of all of below changes.  8/5: patient remains suicidal with thoughts of cutting himself and with depression. Agreeable to trial of Latuda  for bipolar depression as did not tolerated Seroquel  or Zyprexa  well previously and is concerned for weight gain as patient is obese. 2 prior suicide attempts and patient remains at high acute risk for suicide.  8/6: patient remains significantly depressed and suicidal. Intrusive thoughts of self harm but contracts for safety. Tolerating Latuda  for bipolar depression without side effects and will continue to titrate. Patient's outpatient psychiatrist had recommended he try naltrexone  both for substance cravings as well as impulsivity. Patient requesting  trial which I think is appropriate.   8/7: patient remains significantly depressed and suicidal. Intrusive thoughts of self harm via cutting but contracts for safety. Agreeable to titration of Latuda  and starting naltrexone  as recommended by outpatient psychiatrist.   8/8: patient remains significantly depressed and suicidal. Intrusive thoughts of self harm via cutting but contracts for safety. Tolerating increase of Latuda  and starting naltrexone . Patient remains at elevated acute risk for suicide.  Presents with cluster B traits the last 2 days with staff. Would benefit from DBT on outpatient basis once psychiatrically stabilized.  Patient states he takes testosterone  injection which he self administers every Thursday of 0.5 ml. The patient states he self administers testosterone  0.5 ml every Thursday [facility]. States he gets 6 month supply and last filled at [facility] on [address] in [place]. I called and left a voicemail as they dont answer the phone requesting callback for medication verification. Verified with pharmacist that patient receives 100 mg q7 days and this dose was ordered for every Friday.  8/9: patient continues to report SI and intrusive thoughts of cutting. Slept well last night. Agreeable to further increase of Latuda  for treatment of bipolar depression. Received agitation protocol last night. Prominent cluster B traits and patient has previously completed PHP and IOP with minimal  improvement. AT high acute risk for suicide.   8/10: patient presenting with prominent cluster B traits engaging I staff splitting the last 3- 4 days. Mood is irritable and patient endorses intrusive thoughts of cutting. States hydroxyzine  was not helpful with anxiety, will try one time dose of Seroquel . Patient denies SI today however admitted yesterday he is having thoughts of killing himself and cutting, however is anxious for discharge. Patient state he has completed IOP  and PHP programs here previously with minimal benefit.  Patient denies Wellbutrin  being problematic for anxiety noting he finds medication helpful. 2 prior suicide attempts and patient remains at high acute risk for suicide.   Treatment Plan Summary: Daily contact with patient to assess and evaluate symptoms and progress in treatment, Medication management, and Plan as below   Observation Level/Precautions:  15 minute checks  Laboratory:  CBC Chemistry Profile Folic Acid HbAIC UDS Vitamin B-12  Psychotherapy: engaged    Medications:  Wellbutrin  and Depakote , Latuda  and Naltrexone   Consultations:    Discharge Concerns:  elevated acute risk for self harm and suicide  Estimated LOS: 4-5 days  Other:      Safety and Monitoring: voluntarily admission to inpatient psychiatric unit for safety, stabilization and treatment Daily contact with patient to assess and evaluate symptoms and progress in treatment Patient's case to be discussed in multi-disciplinary team meeting Observation Level : q15 minute checks Vital signs: q12 hours Precautions: suicide, elopement, and assault   2. Psychiatric Problems   Bipolar II disorder -cont Depakote  ER 500 mg at bedtime -cont Wellbutrin  XL 150 mg qdaily -cont naltrexone  50 mg qdaily tomorrow AM as patient's outpatient psychiatrist recommended for impulsivity as well as substance abuse cravings in setting of BPD traits cont Latuda  to 60 mg with dinner for bipolar depression and patient is agreeable    - hydroxyzine  25 mg TID- PRN for anxiety -stop trazodone  50 mg at bedtime-PRN for insomnia as pateitn reports it gives him nightmares and it is ineffective Stop Doxepin  10 mg at bedtime as reports has been ineffective the last 2 nights. -cont mirtazapine  7.5 mg at bedtime for insomnia and mood -cont Benadryl  50 mg at bedtime-PRN for insomnia as patient did well on this yesterday he did not tolerate Seroquel  well Stopped doxepin  10 mg at bedtime-PRN  for insomnia due to ineffectiveness   Stimulant use disorder (cocaine), in remission  naltrexone  for cravings and impulsivity off label  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 Medical Management Covid negative CMP: neg CBC: unremarkable EtOH: <10 UDS: not completed I reored TSH:  wnl A1C: 5.4 Lipids: wnl BAL<15   #astham -albuterol  inhaler restarted PRN  - transgender - patient restarted on testosterone  cypionate 100 mg IM qweekly starting 8/8; after verification of outpateint dose   Dispo; patient remains suicidal, likely discharge in 4-5  days  Lilyian Quayle, MD 07/20/2024, 1:26 PM

## 2024-07-20 NOTE — Progress Notes (Signed)
   07/20/24 2312  Psych Admission Type (Psych Patients Only)  Admission Status Voluntary  Psychosocial Assessment  Patient Complaints Anxiety  Eye Contact Fair  Facial Expression Flat  Affect Appropriate to circumstance  Speech Logical/coherent  Interaction Assertive  Motor Activity Other (Comment) (WDL)  Appearance/Hygiene Unremarkable  Behavior Characteristics Appropriate to situation  Mood Anxious;Depressed  Thought Process  Coherency WDL  Content WDL  Delusions WDL  Perception WDL  Hallucination None reported or observed  Judgment WDL  Confusion None  Danger to Self  Current suicidal ideation? Denies  Self-Injurious Behavior No self-injurious ideation or behavior indicators observed or expressed   Agreement Not to Harm Self Yes  Description of Agreement verbal  Danger to Others  Danger to Others None reported or observed

## 2024-07-20 NOTE — Progress Notes (Signed)
 D:  Patient's self inventory sheet, patient has fair sleep, sleep medication helpful.  Good appetite, low energy level, poor concentration.  Rated depression 6, hopeless 10, anxiety 7.  Denied withdrawals.  SI, contracts for safety.  Denied physical problems.  Denied physical pain.  When can I leave?  No discharge plans.  No one is telling me when I can leave. A:  Medications administered per MD orders.  Emotional support and encouragement given patient. R:  Denied HI.  Denied A/V hallucinations.  SI, no plan, contracts for safety.  Safety maintained with 15 minute checks.

## 2024-07-20 NOTE — Group Note (Signed)
 Date:  07/20/2024 Time:  5:08 PM  Group Topic/Focus:  Healthy Communication:   The focus of this group is to discuss communication, barriers to communication, as well as healthy ways to communicate with others.    Participation Level:  Active  Participation Quality:  Appropriate and Attentive  Affect:  Flat  Cognitive:  Alert and Appropriate  Insight: Appropriate  Engagement in Group:  Engaged  Modes of Intervention:  Activity, Discussion, Education, Exploration, and Socialization  Additional Comments:    Berwyn GORMAN Acosta 07/20/2024, 5:08 PM

## 2024-07-20 NOTE — Progress Notes (Signed)
Pt did not attend goals group. 

## 2024-07-20 NOTE — Progress Notes (Signed)
(  Sleep Hours) - 8 hours (Any PRNs that were needed, meds refused, or side effects to meds)-  Nicorette  gum, Benadryl  50 mg given (Any disturbances and when (visitation, over night)- None (Concerns raised by the patient)-  None (SI/HI/AVH)-  Passive SI no plan

## 2024-07-21 NOTE — Group Note (Signed)
 Date:  07/21/2024 Time:  9:50 AM  Group Topic/Focus:  Goals Group:   The focus of this group is to help patients establish daily goals to achieve during treatment and discuss how the patient can incorporate goal setting into their daily lives to aide in recovery.    Participation Level:  Did Not Attend  Janei Scheff A Federick Levene 07/21/2024, 9:50 AM

## 2024-07-21 NOTE — Progress Notes (Signed)
   07/21/24 0823  Psych Admission Type (Psych Patients Only)  Admission Status Voluntary  Psychosocial Assessment  Patient Complaints Agitation;Anergic  Eye Contact Darting  Facial Expression Animated;Anxious  Affect Angry;Anxious  Speech Logical/coherent  Ambulance person Cooperative;Appropriate to situation  Mood Depressed;Anxious  Aggressive Behavior  Effect No apparent injury  Thought Process  Coherency WDL  Content WDL  Delusions WDL  Perception WDL  Hallucination None reported or observed  Judgment WDL  Confusion WDL  Danger to Self  Current suicidal ideation? Denies  Self-Injurious Behavior No self-injurious ideation or behavior indicators observed or expressed   Agreement Not to Harm Self Yes  Description of Agreement Report to staff  Danger to Others  Danger to Others None reported or observed

## 2024-07-21 NOTE — Progress Notes (Signed)
(  Sleep Hours) - 12.25 hours - some on day shift (Any PRNs that were needed, meds refused, or side effects to meds)- Nicorette , Benadryl , Vistaril  (Any disturbances and when (visitation, over night)- None (Concerns raised by the patient)-  None (SI/HI/AVH)-  Denies

## 2024-07-21 NOTE — Progress Notes (Signed)
 Samantha Hunter did not attend wrap up group.

## 2024-07-21 NOTE — Group Note (Signed)
 Therapy Group Note  Group Topic:Other  Group Date: 07/21/2024 Start Time: 1500 End Time: 1535 Facilitators: Dot [place] MATSU, OT    The primary objective of this topic is to explore and understand the concept of occupational balance in the context of daily living. The term occupational balance is defined broadly, encompassing all activities that occupy an individual's time and energy, including self-care, leisure, and work-related tasks. The goal is to guide participants towards achieving a harmonious blend of these activities, tailored to their personal values and life circumstances. This balance is aimed at enhancing overall well-being, not by equally distributing time across activities, but by ensuring that daily engagements are fulfilling and not draining. The content delves into identifying various barriers that individuals face in achieving occupational balance, such as overcommitment, misaligned priorities, external pressures, and lack of effective time management. The impact of these barriers on occupational performance, roles, and lifestyles is examined, highlighting issues like reduced efficiency, strained relationships, and potential health problems. Strategies for cultivating occupational balance are a key focus. These strategies include practical methods like time blocking, prioritizing tasks, establishing self-care rituals, decluttering, connecting with nature, and engaging in reflective practices. These approaches are designed to be adaptable and applicable to a wide range of life scenarios, promoting a proactive and mindful approach to daily living. The overall aim is to equip participants with the knowledge and tools to create a balanced lifestyle that supports their mental, emotional, and physical health, thereby improving their functional performance in daily life.   Arnesha Schiraldi, OT     Participation Level: Engaged   Participation Quality: Independent   Behavior:  Appropriate   Speech/Thought Process: Relevant   Affect/Mood: Appropriate   Insight: Fair   Judgement: Fair      Modes of Intervention: Education  Patient Response to Interventions:  Attentive   Plan: Continue to engage patient in OT groups 2 - 3x/week.  07/21/2024  [place] MATSU Dot, OT   Ziggy Reveles, OT

## 2024-07-21 NOTE — Progress Notes (Signed)
 [facility] MD Progress Note  07/21/2024 3:36 PM Samantha Hunter  MRN:  [id number] Subjective:   23 year old transgender female, lives with his parents.  Background history of borderline personality disorder and bipolar disorder.  Presented to the hospital on account of not feeling safe at home.  Reported feelings of hopelessness and emptiness.  Could not identify any triggers leading to disease.  States that he was off his medications prior to presentation.  He was not sleeping well at home. Routine labs were essentially normal.  No alcohol or any psychoactive substance on board.  I assumed care of this patient today.  Chart reviewed today.  Patient discussed at multidisciplinary team meeting.    Nursing staff reports that patient slept for 12.25 hours.  He is not endorsing any active suicidal thoughts but continues to talk about feeling of hopelessness or worthlessness.  Nursing staff reports that his affect is flat most of the time.  He has been adherent with his psychotropic medications.  He has been participating with groups.  Social worker reports that his parents wants to take him back but they need some guarantees that he is doing okay.  Parents have not come to visit him yet in the hospital.  I met with the patient for the first time today.  He tells me that he is feeling better since getting back on his medication.  He states that he has been able to sleep well now.  Patient acknowledged a chronic feeling of emptiness associated with self-injurious behavior.  States that he typically cuts himself to distract self from emotional distress.  Patient states that since he has been on his medication, he is no longer having suicidal thoughts.  He however still feels like cutting she has to ease attention surrounding his discharge.  Patient states that his parents will rather have him home than him being homeless.  He however has conflicted relationship with his parents.  States that they have not visited him  here as he does not want them to visit.  He will rather have a three-way conversation involving the social worker and his parents.  I encouraged patient to allow his parents visits that way they can all be on the same page.  The alternative would be a shelter discharge if he is not going to go back to his parents.  Patient did not process this logically but kept projecting his anger towards his social worker for not facilitating 3-way call today.   Principal Problem: MDD (major depressive disorder) Diagnosis: Active Problems:   Bipolar II disorder (HCC)  Total Time spent with patient: 30 minutes  Past Psychiatric History:  Prior admissions: multiple prior inpatient admission at [facility] with last in 2018 due to to worsening of depressive symptoms and recurrent suicidality after having discontinued all his medications.  Previous medications include Geodon  20 mg in the morning and 40 mg at bedtime, Zoloft  100 mg at bedtime and Neurontin  400 mg at bedtime. Other trials include Trileptal     History of Suicide:Patient denies any past suicidal attempts Outpatient treatment: Patient states he see Therisa Myron at the [facility], patient referred to [facility] after last admission History of homicide:  denies  Past Medical History:  Past Medical History:  Diagnosis Date   Allergy    Anemia    Anxiety    Asthma    Cannabis use disorder, mild, abuse 03/16/2017   Depression    Gender dysphoria 05/12/2017   GERD (gastroesophageal reflux disease)    Non-suicidal self  harm 03/16/2017   OCD (obsessive compulsive disorder) 03/16/2017   Suicidal ideation 03/16/2017    Past Surgical History:  Procedure Laterality Date   wisdom tooth removal Bilateral    Family History:  Family History  Problem Relation Age of Onset   Mental illness Mother    Alcohol abuse Mother    Drug abuse Mother    Depression Mother    Hyperlipidemia Mother    Mental illness Father    Alcohol abuse Father    Drug abuse Father     Depression Father    Cancer Father        skin   Heart disease Father    Hyperlipidemia Father    Stroke Father    Mental illness Brother    Anxiety disorder Brother    Depression Brother    Anxiety disorder Sister    Depression Sister    Mental illness Sister    Alcohol abuse Maternal Uncle    Drug abuse Maternal Uncle    Alcohol abuse Maternal Grandfather    Mental illness Brother    Mental illness Sister    Family Psychiatric  History:  See H&P  Social History:  Social History   Substance and Sexual Activity  Alcohol Use Not Currently     Social History   Substance and Sexual Activity  Drug Use Not Currently   Frequency: 14.0 times per week   Types: Marijuana, Cocaine   Comment: Coke, weed, and xanax    Social History   Socioeconomic History   Marital status: Single    Spouse name: Not on file   Number of children: Not on file   Years of education: Not on file   Highest education level: Not on file  Occupational History   Not on file  Tobacco Use   Smoking status: Every Day    Current packs/day: 0.25    Average packs/day: 0.3 packs/day for 6.6 years (1.7 ttl pk-yrs)    Types: Cigarettes    Start date: 02/08/2017    Last attempt to quit: 02/08/2018   Smokeless tobacco: Former    Types: Chew   Tobacco comments:    Declined cessation  Vaping Use   Vaping status: Every Day  Substance and Sexual Activity   Alcohol use: Not Currently   Drug use: Not Currently    Frequency: 14.0 times per week    Types: Marijuana, Cocaine    Comment: Coke, weed, and xanax   Sexual activity: Yes    Birth control/protection: Surgical, Other-see comments    Comment: spouse has had vasectomy, pt amenorrheic on testosterone   Other Topics Concern   Not on file  Social History Narrative   Not on file   Social Drivers of Health   Financial Resource Strain: Not on file  Food Insecurity: No Food Insecurity (07/13/2024)   Hunger Vital Sign    Worried About Running Out of  Food in the Last Year: Never true    Ran Out of Food in the Last Year: Never true  Transportation Needs: No Transportation Needs (07/13/2024)   PRAPARE - Administrator, Civil Service (Medical): No    Lack of Transportation (Non-Medical): No  Physical Activity: Not on file  Stress: Not on file  Social Connections: Not on file    Current Medications: Current Facility-Administered Medications  Medication Dose Route Frequency Provider Last Rate Last Admin   acetaminophen  (TYLENOL ) tablet 650 mg  650 mg Oral Q6H PRN Motley-Mangrum, Jadeka A, PMHNP  albuterol  (VENTOLIN  HFA) 108 (90 Base) MCG/ACT inhaler 2 puff  2 puff Inhalation Q4H PRN Zouev, Dmitri, MD       alum & mag hydroxide-simeth (MAALOX/MYLANTA) 200-200-20 MG/5ML suspension 30 mL  30 mL Oral Q4H PRN Motley-Mangrum, Jadeka A, PMHNP       buPROPion  (WELLBUTRIN  XL) 24 hr tablet 150 mg  150 mg Oral Daily Zouev, Dmitri, MD   150 mg at 07/21/24 9176   haloperidol  (HALDOL ) tablet 5 mg  5 mg Oral TID PRN Motley-Mangrum, Jadeka A, PMHNP   5 mg at 07/18/24 1625   And   diphenhydrAMINE  (BENADRYL ) capsule 50 mg  50 mg Oral TID PRN Motley-Mangrum, Jadeka A, PMHNP   50 mg at 07/18/24 1625   diphenhydrAMINE  (BENADRYL ) capsule 50 mg  50 mg Oral QHS PRN Zouev, Dmitri, MD   50 mg at 07/20/24 2152   haloperidol  lactate (HALDOL ) injection 5 mg  5 mg Intramuscular TID PRN Motley-Mangrum, Jadeka A, PMHNP       And   diphenhydrAMINE  (BENADRYL ) injection 50 mg  50 mg Intramuscular TID PRN Motley-Mangrum, Jadeka A, PMHNP       And   LORazepam  (ATIVAN ) injection 2 mg  2 mg Intramuscular TID PRN Motley-Mangrum, Jadeka A, PMHNP       haloperidol  lactate (HALDOL ) injection 10 mg  10 mg Intramuscular TID PRN Motley-Mangrum, Jadeka A, PMHNP       And   diphenhydrAMINE  (BENADRYL ) injection 50 mg  50 mg Intramuscular TID PRN Motley-Mangrum, Jadeka A, PMHNP       And   LORazepam  (ATIVAN ) injection 2 mg  2 mg Intramuscular TID PRN Motley-Mangrum,  Jadeka A, PMHNP       divalproex  (DEPAKOTE  ER) 24 hr tablet 500 mg  500 mg Oral QHS Zouev, Dmitri, MD   500 mg at 07/20/24 2152   hydrOXYzine  (ATARAX ) tablet 25 mg  25 mg Oral TID PRN Motley-Mangrum, Jadeka A, PMHNP   25 mg at 07/21/24 1241   lurasidone  (LATUDA ) tablet 60 mg  60 mg Oral QPC supper Zouev, Dmitri, MD   60 mg at 07/20/24 1728   magnesium  hydroxide (MILK OF MAGNESIA) suspension 30 mL  30 mL Oral Daily PRN Motley-Mangrum, Jadeka A, PMHNP       melatonin tablet 3 mg  3 mg Oral QHS Zouev, Dmitri, MD   3 mg at 07/20/24 2152   mirtazapine  (REMERON ) tablet 7.5 mg  7.5 mg Oral QHS Zouev, Dmitri, MD   7.5 mg at 07/20/24 2152   naltrexone  (DEPADE) tablet 50 mg  50 mg Oral Daily Zouev, Dmitri, MD   50 mg at 07/21/24 9176   nicotine  (NICODERM CQ  - dosed in mg/24 hours) patch 21 mg  21 mg Transdermal Daily Zouev, Dmitri, MD   21 mg at 07/21/24 9176   nicotine  polacrilex (NICORETTE ) gum 4 mg  4 mg Oral PRN Zouev, Dmitri, MD   4 mg at 07/21/24 1504   testosterone  cypionate (DEPOTESTOSTERONE CYPIONATE) injection 100 mg  100 mg Intramuscular Q7 days Zouev, Dmitri, MD   100 mg at 07/18/24 1505    Lab Results: No results found for this or any previous visit (from the past 48 hours).  Blood Alcohol level:  Lab Results  Component Value Date   [facility] <15 07/13/2024   ETH <10 11/05/2023    Metabolic Disorder Labs: Lab Results  Component Value Date   HGBA1C 5.4 07/13/2024   MPG 108 07/13/2024   MPG 111.15 11/05/2023   Lab Results  Component Value  Date   PROLACTIN 32.1 07/13/2024   Lab Results  Component Value Date   CHOL 151 07/13/2024   TRIG 59 07/13/2024   HDL 53 07/13/2024   CHOLHDL 2.8 07/13/2024   VLDL 12 07/13/2024   LDLCALC 86 07/13/2024   LDLCALC 126 (H) 11/05/2023    Physical Findings: AIMS:  ,  ,  ,  ,  ,  ,   CIWA:    COWS:     Musculoskeletal: Strength & Muscle Tone: within normal limits Gait & Station: normal Patient leans: N/A  Psychiatric Specialty  Exam:  Presentation  General Appearance and behavior:  Casually dressed, overweight, was in a group setting prior to interview, appropriate behavioral but exhibited projective identification.  Eye Contact: Good.  Speech: Spontaneous.  Not pressured, not loud  Mood and Affect  Mood: Subjectively feels okay but at the same taken wants to harm himself.  Affect: Restricted and appropriate.  Thought Process  Thought Processes: Linear and goal directed.  Descriptions of Associations:Intact  Orientation:Full (Time, Place and Person)  Thought Content: No current suicidal thoughts.  Thoughts of nonsuicidal self-injurious behavior.  No homicidal thoughts.  No thoughts of violence.  No negative ruminative flooding.  No guilty ruminations.  No delusional theme.  No obsessions.  Hallucinations: No hallucination in any modality.  Sensorium  Memory: Good.  Judgment: Fair.  Insight: Good  Executive Functions  Concentration: Good.  Attention Span: Good.  Recall: Good.  Fund of Knowledge: Good.  Language: Good   Psychomotor Activity  Normal psychomotor activity    Physical Exam: Physical Exam ROS Blood pressure 113/75, pulse 72, temperature 97.9 F (36.6 C), temperature source Oral, resp. rate 18, height 5' 5 (1.651 m), weight 129.5 kg, SpO2 100%. Body mass index is 47.51 kg/m.   Treatment Plan Summary: Patient with extensive history of borderline personality disorder.  Presented with suicidal ideation which has eased since he started taking psychotropic medication.  Patient continues to use immature defense mechanisms.  Continues to project anger towards parents and towards caregivers.  No evidence of mania.  No evidence of psychosis.  We will facilitate meeting between patient and his family to determine if he can return back home.  We are finalizing aftercare with the option of discharge home to family or to the shelter in a couple of days.  1.  Continue  bupropion  XL 150 mg daily. 2.  Continue Depakote  ER 500 mg daily. 3.  Continue lurasidone  60 mg at bedtime. 4.  Continue naltrexone  50 mg daily. 5.  Continue mirtazapine  6.  Continue home medical medications at the same dose. 7.  Family to visit and give us  feedback after interacting with patient. 8.  Continue to encourage unit groups and therapeutic activities. 9.  Social worker will coordinate discharge and aftercare planning.  Jerrell DELENA Forehand, MD 07/21/2024, 3:36 PM

## 2024-07-21 NOTE — BHH Group Notes (Signed)
 Spiritual care group on grief and loss facilitated by Chaplain Rockie Sofia, [facility]  Group Goal: Support / Education around grief and loss  Members engage in facilitated group support and psycho-social education.  Group Description:  Following introductions and group rules, group members engaged in facilitated group dialogue and support around topic of loss, with particular support around experiences of loss in their lives. Group Identified types of loss (relationships / self / things) and identified patterns, circumstances, and changes that precipitate losses. Reflected on thoughts / feelings around loss, normalized grief responses, and recognized variety in grief experience. Group encouraged individual reflection on safe space and on the coping skills that they are already utilizing.  Group drew on Adlerian / Rogerian and narrative framework  Patient Progress: Samantha Hunter attended group .Verbal participation was minimal, but he demonstrated engagement in the group conversation.

## 2024-07-21 NOTE — Progress Notes (Signed)
 Collateral contact - Ariella Voit (mom) [phone]   CSW Supervisor Massie Silvius spoke with mom who advised her that patient can return home upon discharge.  Mom had a few questions for patient's doctor.   Maricela Schreur, LCSWA 07/17/2024

## 2024-07-21 NOTE — Progress Notes (Signed)
 Collateral contact - Vicktoria Muckey (mom) [phone]   CSW Supervisor, CSW, patient and mom spoke on the phone.  Mom said that patient can return to their home, and she will pick him up upon discharge.   Marla Pouliot, LCSWA 07/21/2024

## 2024-07-21 NOTE — Plan of Care (Signed)
  Problem: Activity: Goal: Interest or engagement in activities will improve Outcome: Progressing   Problem: Coping: Goal: Ability to verbalize frustrations and anger appropriately will improve Outcome: Progressing   Problem: Physical Regulation: Goal: Ability to maintain clinical measurements within normal limits will improve Outcome: Progressing

## 2024-07-22 MED ORDER — NALTREXONE HCL 50 MG PO TABS: 50.0000 mg | ORAL_TABLET | Freq: Every day | ORAL | 0 refills | Status: AC

## 2024-07-22 MED ORDER — NICOTINE 21 MG/24HR TD PT24: 21.0000 mg | MEDICATED_PATCH | Freq: Every day | TRANSDERMAL | 0 refills | Status: AC

## 2024-07-22 MED ORDER — LURASIDONE HCL 60 MG PO TABS: 60.0000 mg | ORAL_TABLET | Freq: Every day | ORAL | 0 refills | Status: AC

## 2024-07-22 MED ORDER — DIVALPROEX SODIUM ER 500 MG PO TB24: 500.0000 mg | ORAL_TABLET | Freq: Every day | ORAL | 0 refills | Status: AC

## 2024-07-22 MED ORDER — MIRTAZAPINE 7.5 MG PO TABS: 7.5000 mg | ORAL_TABLET | Freq: Every day | ORAL | 0 refills | Status: AC

## 2024-07-22 MED ORDER — BUPROPION HCL ER (XL) 150 MG PO TB24: 150.0000 mg | ORAL_TABLET | Freq: Every day | ORAL | 0 refills | Status: AC

## 2024-07-22 MED ORDER — MELATONIN 3 MG PO TABS: 3.0000 mg | ORAL_TABLET | Freq: Every day | ORAL | 0 refills | Status: AC

## 2024-07-22 NOTE — Progress Notes (Signed)
 [name]  D/C'd Home per MD order.  Discussed with the patient and all questions fully answered.   An After Visit Summary was printed and given to the patient. Patient received prescription.  D/c education completed with patient including follow up instructions, medication list, d/c activities limitations if indicated, with other d/c instructions as indicated by MD - patient able to verbalize understanding, all questions fully answered. Patient denies SI/HI/AVH at  discharge.  Patient instructed to return to ED, call 911, or call MD for any changes in condition.   Patient escorted to the main , and D/C home via private auto.  Joaquin MALVA Doing 07/22/2024 4:09 PM

## 2024-07-22 NOTE — Plan of Care (Signed)

## 2024-07-22 NOTE — Plan of Care (Signed)
   Problem: Education: Goal: Emotional status will improve Outcome: Progressing Goal: Mental status will improve Outcome: Progressing Goal: Verbalization of understanding the information provided will improve Outcome: Progressing

## 2024-07-22 NOTE — Progress Notes (Signed)
  [facility] Adult Case Management Discharge Plan :  Will you be returning to the same living situation after discharge:  Yes,  patient lives with his parents At discharge, do you have transportation home?: Yes,  [facility] will pick up patient at 3:00 PM Do you have the ability to pay for your medications: Yes,  patient has insurance  Release of information consent forms completed and in the chart;  Patient's signature needed at discharge.  Patient to Follow up at:  Follow-up Information     [facility]. Go on 07/29/2024.   Specialty: Behavioral Health Why: You have an appointment for medication management services on  07/29/24 at 10:30 am.  You also have an appointment scheduled with your current therapist on 07/31/24 at 2:00 pm .  At this time, please request DBT therapy services/therapist. Contact information: [address] [place] [address] [phone]                 Next level of care provider has access to [facility] Link:no  Safety Planning and Suicide Prevention discussed: Yes,  Canda Podgorski (mom) [phone]  Has patient been referred to the Quitline?: Patient refused referral for treatment  Patient has been referred for addiction treatment:  At admission, patient tested positive for marijuana.  He expressed a desire to stop using.  He said he will discuss it with outpatient therapist.      Zamani Crocker O Gunnison Chahal, LCSWA 07/22/2024, 2:26 PM

## 2024-07-22 NOTE — B[facility] Suicide Risk Assessment (Signed)
 [facility] Discharge Suicide Risk Assessment   Principal Problem: MDD (major depressive disorder) Discharge Diagnoses: Active Problems:   Bipolar II disorder (HCC)   Total Time spent with patient: 30 minutes  Musculoskeletal: Strength & Muscle Tone: within normal limits Gait & Station: normal Patient leans: N/A  Psychiatric Specialty Exam  Presentation  General Appearance:  Casually dressed, overweight, not in any distress, appropriate behavior, engaged politely.  No EPS.  Eye Contact: Good.  Speech: Spontaneous.  Normal rate, tone and volume.  Normal prosody of speech.  Mood and Affect  Mood: Euthymic.  Affect: Full range and appropriate.  Thought Process  Thought Processes: Linear and goal directed.  Descriptions of Associations:Intact  Orientation:Full (Time, Place and Person)  Thought Content: Future oriented.  No current suicidal thoughts.  No homicidal thoughts.  No thoughts of violence.  No negative ruminative flooding.  No guilty ruminations.  No delusional theme.  No obsessions.  Hallucinations: No hallucination in any modality.  Sensorium  Memory: Good.  Judgment: Good.  Insight: Good  Executive Functions  Concentration: Good.  Attention Span: Good.  Recall: Good.  Fund of Knowledge: Good.  Language: Good   Psychomotor Activity  Normal psychomotor activity   Physical Exam: Physical Exam ROS Blood pressure 108/87, pulse 76, temperature 97.8 F (36.6 C), temperature source Oral, resp. rate 12, height 5' 5 (1.651 m), weight 129.5 kg, SpO2 100%. Body mass index is 47.51 kg/m.  Mental Status Per Nursing Assessment::   On Admission:  Suicidal ideation indicated by patient  Demographic Factors:  Gay, lesbian, or bisexual orientation  Loss Factors: NA  Historical Factors: Family history of mental illness or substance abuse and Impulsivity  Risk Reduction Factors:   Living with another person, especially a relative,  Positive social support, Positive therapeutic relationship, and Positive coping skills or problem solving skills  Continued Clinical Symptoms:  No residual mood symptoms.  No overwhelming anxiety.  No evidence of psychosis.  No evidence of mania. Cognitive Features That Contribute To Risk:  None    Suicide Risk:  Minimal: No current suicidal ideation.  No homicidal thoughts.  No thoughts of violence.  Modifiable risk factors are targeted as mood regulation.  Patient's family remains supportive.  No new psychosocial stressors.  Patient is stable for care at the lower setting.  Follow-up Information     [facility]. Go on 07/29/2024.   Specialty: Behavioral Health Why: You have an appointment for medication management services on  07/29/24 at 10:30 am.  You also have an appointment scheduled with your current therapist on 07/31/24 at 2:00 pm .  At this time, please request DBT therapy services/therapist. Contact information: [address] [place] [address] [phone]                 Plan Of Care/Follow-up recommendations:  See discharge summary.  Jerrell DELENA Forehand, MD 07/22/2024, 10:40 AM
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
## 2024-07-22 NOTE — BHH Group Notes (Signed)
 Adult Psychoeducational Group Note  Date:  07/22/2024 Time:  10:11 AM  Group Topic/Focus:  Goals Group:   The focus of this group is to help patients establish daily goals to achieve during treatment and discuss how the patient can incorporate goal setting into their daily lives to aide in recovery. Orientation:   The focus of this group is to educate the patient on the purpose and policies of crisis stabilization and provide a format to answer questions about their admission.  The group details unit policies and expectations of patients while admitted.  Participation Level:  Active  Participation Quality:  Appropriate  Affect:  Appropriate  Cognitive:  Appropriate  Insight: Appropriate  Engagement in Group:  Engaged  Modes of Intervention:  Discussion  Additional Comments:  Pt attended the goals group and remained appropriate and engaged throughout the duration of the group.   Samantha Hunter 07/22/2024, 10:11 AM

## 2024-07-22 NOTE — Progress Notes (Signed)
 Patient was given return to school letter.   Amorina Doerr, LCSWA 07/22/2024

## 2024-07-22 NOTE — Progress Notes (Signed)
(  Sleep Hours) - 13.5  (Any PRNs that were needed, meds refused, or side effects to meds)- Vistaril  25 mg - Anxiety and Benadrly 50 mg for sleep.  (Any disturbances and when (visitation, over night)-  (Concerns raised by the patient)- Anxiety  (SI/HI/AVH)- Denies

## 2024-07-23 NOTE — Group Note (Unsigned)
 Date:  07/23/2024 Time:  2:57 AM  Group Topic/Focus:  Wrap-Up Group:   The focus of this group is to help patients review their daily goal of treatment and discuss progress on daily workbooks.     Participation Level:  {BHH PARTICIPATION OZCZO:77735}  Participation Quality:  {BHH PARTICIPATION QUALITY:22265}  Affect:  {BHH AFFECT:22266}  Cognitive:  {BHH COGNITIVE:22267}  Insight: {BHH Insight2:20797}  Engagement in Group:  {BHH ENGAGEMENT IN HMNLE:77731}  Modes of Intervention:  {BHH MODES OF INTERVENTION:22269}  Additional Comments:  ***  Lang Donia Law 07/23/2024, 2:57 AM
